# Patient Record
Sex: Female | Born: 1935 | Race: White | Hispanic: No | Smoking: Never smoker
Health system: Southern US, Community
[De-identification: ages and names within clinical notes are randomized; demographics above are authoritative.]

## PROBLEM LIST (undated history)

## (undated) DIAGNOSIS — I10 Essential (primary) hypertension: Secondary | ICD-10-CM

## (undated) DIAGNOSIS — T148XXA Other injury of unspecified body region, initial encounter: Secondary | ICD-10-CM

## (undated) DIAGNOSIS — I639 Cerebral infarction, unspecified: Secondary | ICD-10-CM

## (undated) DIAGNOSIS — E785 Hyperlipidemia, unspecified: Secondary | ICD-10-CM

## (undated) DIAGNOSIS — J90 Pleural effusion, not elsewhere classified: Secondary | ICD-10-CM

## (undated) DIAGNOSIS — I714 Abdominal aortic aneurysm, without rupture, unspecified: Secondary | ICD-10-CM

## (undated) DIAGNOSIS — I509 Heart failure, unspecified: Secondary | ICD-10-CM

## (undated) DIAGNOSIS — IMO0002 Reserved for concepts with insufficient information to code with codable children: Secondary | ICD-10-CM

## (undated) DIAGNOSIS — L309 Dermatitis, unspecified: Secondary | ICD-10-CM

## (undated) DIAGNOSIS — I499 Cardiac arrhythmia, unspecified: Secondary | ICD-10-CM

## (undated) DIAGNOSIS — E119 Type 2 diabetes mellitus without complications: Secondary | ICD-10-CM

## (undated) HISTORY — PX: ABDOMINAL HYSTERECTOMY: SHX81

## (undated) HISTORY — DX: Pleural effusion, not elsewhere classified: J90

## (undated) HISTORY — PX: CORNEAL TRANSPLANT: SHX108

## (undated) HISTORY — DX: Reserved for concepts with insufficient information to code with codable children: IMO0002

## (undated) HISTORY — DX: Cerebral infarction, unspecified: I63.9

## (undated) HISTORY — DX: Hyperlipidemia, unspecified: E78.5

## (undated) HISTORY — DX: Essential (primary) hypertension: I10

## (undated) HISTORY — DX: Heart failure, unspecified: I50.9

## (undated) HISTORY — DX: Dermatitis, unspecified: L30.9

## (undated) HISTORY — DX: Abdominal aortic aneurysm, without rupture, unspecified: I71.40

## (undated) HISTORY — DX: Type 2 diabetes mellitus without complications: E11.9

## (undated) HISTORY — PX: CHOLECYSTECTOMY: SHX55

## (undated) HISTORY — DX: Other injury of unspecified body region, initial encounter: T14.8XXA

## (undated) HISTORY — DX: Abdominal aortic aneurysm, without rupture: I71.4

## (undated) HISTORY — PX: KNEE SURGERY: SHX244

---

## 2014-01-30 ENCOUNTER — Ambulatory Visit (INDEPENDENT_AMBULATORY_CARE_PROVIDER_SITE_OTHER): Payer: Medicare HMO | Admitting: Cardiovascular Disease

## 2014-01-30 ENCOUNTER — Encounter: Payer: Self-pay | Admitting: Cardiovascular Disease

## 2014-01-30 VITALS — BP 168/74 | HR 70 | Ht 65.5 in | Wt 158.0 lb

## 2014-01-30 DIAGNOSIS — I4891 Unspecified atrial fibrillation: Secondary | ICD-10-CM | POA: Insufficient documentation

## 2014-01-30 DIAGNOSIS — I482 Chronic atrial fibrillation, unspecified: Secondary | ICD-10-CM

## 2014-01-30 DIAGNOSIS — R0602 Shortness of breath: Secondary | ICD-10-CM

## 2014-01-30 DIAGNOSIS — I509 Heart failure, unspecified: Secondary | ICD-10-CM | POA: Insufficient documentation

## 2014-01-30 DIAGNOSIS — R600 Localized edema: Secondary | ICD-10-CM

## 2014-01-30 DIAGNOSIS — R0789 Other chest pain: Secondary | ICD-10-CM

## 2014-01-30 DIAGNOSIS — R609 Edema, unspecified: Secondary | ICD-10-CM

## 2014-01-30 NOTE — Assessment & Plan Note (Signed)
Jean Murphy  presents for evaluation he she's had leg edema for the past several months and recently moved from [place]. She was told that she had congestive heart failure. I do not have any results of echoes or other assessments of left ventricular function. She does have significant leg edema all the way up to her thighs.  It's difficult to say whether this leg edema due to her general and activity.  She walks with a walker and so this may be due to venous insufficiency.  In addition, she also has atrial fibrillation which is likely continuing to her congestive heart failure. Her atrial fibrillation rate is well controlled.  We'll get an echo for further assessment of her LV function. We'll draw labs including basic metabolic profile, TSH, CBC, B. natruretic peptide, and liver enzymes.  I will see her  again in 3 months for followup visit.  I have told her son that  we will be very conservative with her management. I do not anticipate putting her through any invasive procedures.

## 2014-01-30 NOTE — Patient Instructions (Addendum)
Your physician has requested that you have an echocardiogram. Echocardiography is a painless test that uses sound waves to create images of your heart. It provides your doctor with information about the size and shape of your heart and how well your heart's chambers and valves are working. This procedure takes approximately one hour. There are no restrictions for this procedure.  We will draw labs today:  CBC, BMET, Liver profile, BNP, TSH  Your physician recommends that you schedule a follow-up appointment in: 3 months  I recommend that you elevate your legs .   Go to [email or web address]

## 2014-01-30 NOTE — Progress Notes (Signed)
Jean Murphy Date of Birth  July 15, 1917       [facility] Office [address], [place], [place]  [address]   [place]  [address] [place]   Fax  [phone]     Fax [phone]  Problem List: 1. Congestive heart failure 2. Atrial fibrillation 3. Abdominal aortic aneurysm 4. Hypertension  History of Present Illness:  Jean Murphy is a 80 y.o. femaile who was recently diagnosed with CHF - she presented to her medical doctor and was found to have leg edema and dyspnea.   She was diagnosed with CHF up in [facility] - records are not available yet.   She was started on Lasix ( and is also on Digoxin) .  She was not aware that she had atrial fib.  Is only on an aspirin - not on anticoagulant.   She lives in [place] [facility])    She moves around slowly.  She does not do any of her own shopping - goes with family.   Walks with a walker in the store.    Wears home O2 at night.  Current Outpatient Prescriptions on File Prior to Visit  Medication Sig Dispense Refill  . aspirin 81 MG tablet Take 81 mg by mouth daily.      . digoxin (LANOXIN) 0.125 MG tablet Take by mouth daily.      . furosemide (LASIX) 20 MG tablet Take 20 mg by mouth 3 (three) times daily.       . hydrALAZINE (APRESOLINE) 50 MG tablet Take 50 mg by mouth every 8 (eight) hours.      . metoprolol (LOPRESSOR) 50 MG tablet Take 50 mg by mouth 2 (two) times daily.      Marland Kitchen PARoxetine (PAXIL) 10 MG tablet Take 10 mg by mouth daily.       No current facility-administered medications on file prior to visit.    Allergies  Allergen Reactions  . Penicillins   . Shellfish Allergy     Hives      Past Medical History  Diagnosis Date  . CHF (congestive heart failure)   . Pleural effusion   . HTN (hypertension)   . Stroke   . Hyperlipidemia   . Diabetes mellitus without complication   . Compound fracture     left  leg  . AAA (abdominal aortic aneurysm)   . Eczema   . Compression fracture     x 2     Past Surgical History  Procedure Laterality Date  . Cholecystectomy    . Abdominal hysterectomy    . Knee surgery      right knee   . Corneal transplant      History  Smoking status  . Former Smoker -- 0.25 packs/day for 20 years  . Types: Cigarettes  Smokeless tobacco  . Not on file    History  Alcohol Use No    Family History  Problem Relation Age of Onset  . Heart attack Mother 90  . Heart disease Mother     Reviw of Systems:  Reviewed in the HPI.  All other systems are negative.  Physical Exam: Blood pressure 168/74, pulse 70, height 5' 5.5" (1.664 m), weight 158 lb (71.668 kg). Wt Readings from Last 3 Encounters:  01/30/14 158 lb (71.668 kg)     General: Well developed, well nourished, in no acute distress.  Head: Normocephalic, atraumatic, sclera non-icteric, mucus membranes are moist,   Neck: Supple. Carotids are 2 + without bruits. No JVD   Lungs: Clear   Heart: Irreg. Irreg.   Abdomen: Soft, non-tender, non-distended with normal bowel sounds.  Msk:  Strength and tone are normal   Extremities: No clubbing or cyanosis. 2+ edema up to her thighs.   Neuro: CN II - XII intact.  Alert and oriented X 3.   Psych:  Normal   ECG: Atrial fibrillation with controlled ventricular response  Assessment / Plan:

## 2014-01-30 NOTE — Assessment & Plan Note (Signed)
Her atrial fibrillation rate is well controlled. She is on aspirin but is not on anticoagulant. She has a history of falls and her son-in-law states that she falls every month percent. She walks with a walker but becomes unsteady on occasion.  We'll continue aspirin at current dose.  She is also on digoxin 0.125 mg a day. Her heart rate is well controlled. She's not having any blurred vision or nausea. We'll consider getting a digoxin level  but clinically she does not appear to be dig toxic so I do not think it is needed today.  She is completely asymptomatic and cannot tell that her heart is beating irregularly.

## 2014-02-17 ENCOUNTER — Ambulatory Visit: Payer: Self-pay | Admitting: Interventional Cardiology

## 2014-02-20 ENCOUNTER — Other Ambulatory Visit: Payer: Medicare HMO

## 2014-02-25 ENCOUNTER — Other Ambulatory Visit (HOSPITAL_COMMUNITY): Payer: Self-pay | Admitting: Cardiovascular Disease

## 2014-02-25 DIAGNOSIS — R0602 Shortness of breath: Secondary | ICD-10-CM

## 2014-02-26 ENCOUNTER — Ambulatory Visit (HOSPITAL_COMMUNITY): Payer: Medicare HMO | Attending: Cardiovascular Disease

## 2014-02-26 DIAGNOSIS — I509 Heart failure, unspecified: Secondary | ICD-10-CM | POA: Insufficient documentation

## 2014-02-26 DIAGNOSIS — I4891 Unspecified atrial fibrillation: Secondary | ICD-10-CM | POA: Diagnosis not present

## 2014-02-26 DIAGNOSIS — R0602 Shortness of breath: Secondary | ICD-10-CM | POA: Diagnosis not present

## 2014-02-26 NOTE — Progress Notes (Signed)
2D Echo completed. 02/26/2014

## 2014-02-27 ENCOUNTER — Telehealth: Payer: Self-pay | Admitting: *Deleted

## 2014-02-27 NOTE — Telephone Encounter (Signed)
Message copied by Verna Czech on Fri Feb 27, 2014  2:12 PM ------      Message from: [place], [place] J      Created: Fri Feb 27, 2014  1:55 PM       The LV function is normal      She has moderate pulmonary hypertension      Continue current meds       ------

## 2014-02-27 NOTE — Telephone Encounter (Signed)
Left detailed message on personal voicemail Horton Chin RN

## 2014-05-01 ENCOUNTER — Ambulatory Visit: Payer: Medicare HMO | Admitting: Cardiovascular Disease

## 2014-05-05 ENCOUNTER — Encounter: Payer: Self-pay | Admitting: Cardiovascular Disease

## 2014-05-05 ENCOUNTER — Ambulatory Visit (INDEPENDENT_AMBULATORY_CARE_PROVIDER_SITE_OTHER): Payer: Commercial Managed Care - HMO | Admitting: Cardiovascular Disease

## 2014-05-05 VITALS — BP 130/74 | HR 61 | Ht 65.5 in | Wt 139.0 lb

## 2014-05-05 DIAGNOSIS — I482 Chronic atrial fibrillation, unspecified: Secondary | ICD-10-CM

## 2014-05-05 DIAGNOSIS — I509 Heart failure, unspecified: Secondary | ICD-10-CM

## 2014-05-05 NOTE — Assessment & Plan Note (Signed)
Jean Murphy seems to be doing well. She has a hx of falling so she has not been on anticoagulation I've discussed this with her son who agrees with that plan  She has had a CVA in the past and is at risk for further strokes Hr is well controlled.

## 2014-05-05 NOTE — Patient Instructions (Signed)
Your physician recommends that you continue on your current medications as directed. Please refer to the Current Medication list given to you today.  Your physician wants you to follow-up in: 6 months with Dr. Nahser.  You will receive a reminder letter in the mail two months in advance. If you don't receive a letter, please call our office to schedule the follow-up appointment.  

## 2014-05-05 NOTE — Assessment & Plan Note (Signed)
She seems to be fairly stable. She has chronic leg edema. She doesn't seem to be in any distress. Overall she's doing quite well for 80 years old. I'll see her again in 6 months.

## 2014-05-05 NOTE — Progress Notes (Signed)
Jean Murphy Date of Birth  12-23-1917       [facility] [address], [place], [place]  [address]   [place]  [address] [place]   Fax  [phone]     Fax [phone]  Problem List: 1. Congestive heart failure 2. Atrial fibrillation 3. Abdominal aortic aneurysm 4. Hypertension  History of Present Illness:  [name] is a 80 y.o. femaile who was recently diagnosed with CHF - she presented to her medical doctor and was found to have leg edema and dyspnea.   She was diagnosed with CHF up in [facility] - records are not available yet.   She was started on Lasix ( and is also on Digoxin) .  She was not aware that she had atrial fib.  Is only on an aspirin - not on anticoagulant.   [place] in [place] [facility] [place]    She moves around slowly.  She does not do any of her own shopping - goes with family.   Walks with a walker in the store.    Wears home O2 at night.   Dec. 8, 2015:  Ms. Jean [place] is a 80 yo who we see for Atrial fib, CHF,  And AAA. She is on home O2.  Does not have a portable O2 unit so she is hypoxic presently. Does not appear to be in any distress.    Current Outpatient Prescriptions on File Prior to Visit  Medication Sig Dispense Refill  . acetaminophen (TYLENOL) 500 MG tablet Take 500 mg by mouth every 6 (six) hours as needed.    Marland Kitchen aspirin 81 MG tablet Take 81 mg by mouth daily.    . calcitonin, salmon, (MIACALCIN/FORTICAL) 200 UNIT/ACT nasal spray Place 1 spray into alternate nostrils daily.    . Difluprednate (DUREZOL) 0.05 % EMUL Apply to eye 2 (two) times daily.    . digoxin (LANOXIN) 0.125 MG tablet Take by mouth daily.    . furosemide (LASIX) 20 MG tablet Take 20 mg by mouth 3 (three) times daily.     . hydrALAZINE (APRESOLINE) 50 MG tablet Take 50 mg by mouth every 8 (eight) hours.    . metoprolol (LOPRESSOR) 50 MG tablet Take 50 mg by mouth  2 (two) times daily.    . Multiple Vitamin (MULTIVITAMIN) tablet Take 1 tablet by mouth daily.    Marland Kitchen PARoxetine (PAXIL) 10 MG tablet Take 10 mg by mouth daily.    . potassium chloride 20 MEQ/15ML (10%) solution Take 20 mEq by mouth daily.     No current facility-administered medications on file prior to visit.    Allergies  Allergen Reactions  . Penicillins   . Shellfish Allergy     Hives      Past Medical History  Diagnosis Date  . CHF (congestive heart failure)   . Pleural effusion   . HTN (hypertension)   . Stroke   . Hyperlipidemia   . Diabetes mellitus without complication   . Compound fracture     left leg  . AAA (abdominal aortic aneurysm)   . Eczema   . Compression fracture     x 2     Past Surgical History  Procedure Laterality Date  . Cholecystectomy    . Abdominal hysterectomy    . Knee surgery      right knee   . Corneal transplant  History  Smoking status  . Former Smoker -- 0.25 packs/day for 20 years  . Types: Cigarettes  Smokeless tobacco  . Not on file    History  Alcohol Use No    Family History  Problem Relation Age of Onset  . Heart attack Mother 100  . Heart disease Mother     Reviw of Systems:  Reviewed in the HPI.  All other systems are negative.  Physical Exam: Blood pressure 130/74, pulse 61, height 5' 5.5" (1.664 m), weight 139 lb (63.05 kg), SpO2 84 %. Wt Readings from Last 3 Encounters:  05/05/14 139 lb (63.05 kg)  01/30/14 158 lb (71.668 kg)     General: Well developed, well nourished, in no acute distress.  Head: Normocephalic, atraumatic, sclera non-icteric, mucus membranes are moist,   Neck: Supple. Carotids are 2 + without bruits. No JVD   Lungs: Clear   Heart: Irreg. Irreg.   Abdomen: Soft, non-tender, non-distended with normal bowel sounds.  Msk:  Strength and tone are normal   Extremities: No clubbing or cyanosis. 2+ edema up to her thighs.   Neuro: CN II - XII intact.  Alert and oriented X 3.     Psych:  Normal   ECG: DEc. 8, 2015:   Atrial fib at 61,  No ST or T wave abn.  Assessment / Plan:

## 2014-05-30 ENCOUNTER — Encounter (HOSPITAL_COMMUNITY): Payer: Self-pay | Admitting: Emergency Medicine

## 2014-05-30 ENCOUNTER — Emergency Department (HOSPITAL_COMMUNITY): Payer: Commercial Managed Care - HMO

## 2014-05-30 ENCOUNTER — Inpatient Hospital Stay (HOSPITAL_COMMUNITY)
Admission: EM | Admit: 2014-05-30 | Discharge: 2014-06-09 | DRG: 291 | Disposition: A | Discharge status: Skilled Nursing Facility | Payer: Commercial Managed Care - HMO | Attending: Family Medicine | Admitting: Family Medicine

## 2014-05-30 DIAGNOSIS — J00 Acute nasopharyngitis [common cold]: Secondary | ICD-10-CM

## 2014-05-30 DIAGNOSIS — I482 Chronic atrial fibrillation: Secondary | ICD-10-CM | POA: Diagnosis present

## 2014-05-30 DIAGNOSIS — R600 Localized edema: Secondary | ICD-10-CM | POA: Diagnosis not present

## 2014-05-30 DIAGNOSIS — I272 Other secondary pulmonary hypertension: Secondary | ICD-10-CM | POA: Diagnosis present

## 2014-05-30 DIAGNOSIS — Z8249 Family history of ischemic heart disease and other diseases of the circulatory system: Secondary | ICD-10-CM

## 2014-05-30 DIAGNOSIS — I248 Other forms of acute ischemic heart disease: Secondary | ICD-10-CM | POA: Diagnosis present

## 2014-05-30 DIAGNOSIS — A047 Enterocolitis due to Clostridium difficile: Secondary | ICD-10-CM | POA: Diagnosis not present

## 2014-05-30 DIAGNOSIS — Z7982 Long term (current) use of aspirin: Secondary | ICD-10-CM

## 2014-05-30 DIAGNOSIS — Z880 Allergy status to penicillin: Secondary | ICD-10-CM

## 2014-05-30 DIAGNOSIS — Z9049 Acquired absence of other specified parts of digestive tract: Secondary | ICD-10-CM | POA: Diagnosis present

## 2014-05-30 DIAGNOSIS — E876 Hypokalemia: Secondary | ICD-10-CM | POA: Diagnosis not present

## 2014-05-30 DIAGNOSIS — Z79899 Other long term (current) drug therapy: Secondary | ICD-10-CM

## 2014-05-30 DIAGNOSIS — A419 Sepsis, unspecified organism: Secondary | ICD-10-CM | POA: Diagnosis not present

## 2014-05-30 DIAGNOSIS — J189 Pneumonia, unspecified organism: Secondary | ICD-10-CM

## 2014-05-30 DIAGNOSIS — E118 Type 2 diabetes mellitus with unspecified complications: Secondary | ICD-10-CM | POA: Diagnosis present

## 2014-05-30 DIAGNOSIS — R7989 Other specified abnormal findings of blood chemistry: Secondary | ICD-10-CM

## 2014-05-30 DIAGNOSIS — E785 Hyperlipidemia, unspecified: Secondary | ICD-10-CM | POA: Diagnosis present

## 2014-05-30 DIAGNOSIS — Z8673 Personal history of transient ischemic attack (TIA), and cerebral infarction without residual deficits: Secondary | ICD-10-CM

## 2014-05-30 DIAGNOSIS — Z9181 History of falling: Secondary | ICD-10-CM

## 2014-05-30 DIAGNOSIS — I5033 Acute on chronic diastolic (congestive) heart failure: Secondary | ICD-10-CM | POA: Diagnosis not present

## 2014-05-30 DIAGNOSIS — G9341 Metabolic encephalopathy: Secondary | ICD-10-CM | POA: Diagnosis not present

## 2014-05-30 DIAGNOSIS — R778 Other specified abnormalities of plasma proteins: Secondary | ICD-10-CM | POA: Diagnosis present

## 2014-05-30 DIAGNOSIS — Z87891 Personal history of nicotine dependence: Secondary | ICD-10-CM

## 2014-05-30 DIAGNOSIS — I10 Essential (primary) hypertension: Secondary | ICD-10-CM

## 2014-05-30 DIAGNOSIS — Z9981 Dependence on supplemental oxygen: Secondary | ICD-10-CM

## 2014-05-30 DIAGNOSIS — Z90710 Acquired absence of both cervix and uterus: Secondary | ICD-10-CM

## 2014-05-30 DIAGNOSIS — W19XXXA Unspecified fall, initial encounter: Secondary | ICD-10-CM

## 2014-05-30 DIAGNOSIS — N390 Urinary tract infection, site not specified: Secondary | ICD-10-CM | POA: Diagnosis present

## 2014-05-30 DIAGNOSIS — I714 Abdominal aortic aneurysm, without rupture: Secondary | ICD-10-CM | POA: Diagnosis present

## 2014-05-30 DIAGNOSIS — M109 Gout, unspecified: Secondary | ICD-10-CM | POA: Diagnosis present

## 2014-05-30 DIAGNOSIS — J690 Pneumonitis due to inhalation of food and vomit: Secondary | ICD-10-CM | POA: Diagnosis not present

## 2014-05-30 DIAGNOSIS — Z91013 Allergy to seafood: Secondary | ICD-10-CM

## 2014-05-30 DIAGNOSIS — J40 Bronchitis, not specified as acute or chronic: Secondary | ICD-10-CM

## 2014-05-30 LAB — CBG MONITORING, ED: GLUCOSE-CAPILLARY: 129 mg/dL — AB (ref 70–99)

## 2014-05-30 LAB — CBC WITH DIFFERENTIAL/PLATELET
BASOS ABS: 0 10*3/uL (ref 0.0–0.1)
BASOS PCT: 0 % (ref 0–1)
Eosinophils Absolute: 0 10*3/uL (ref 0.0–0.7)
Eosinophils Relative: 0 % (ref 0–5)
HEMATOCRIT: 47.3 % — AB (ref 36.0–46.0)
HEMOGLOBIN: 14.9 g/dL (ref 12.0–15.0)
LYMPHS ABS: 0.5 10*3/uL — AB (ref 0.7–4.0)
LYMPHS PCT: 4 % — AB (ref 12–46)
MCH: 30.9 pg (ref 26.0–34.0)
MCHC: 31.5 g/dL (ref 30.0–36.0)
MCV: 98.1 fL (ref 78.0–100.0)
Monocytes Absolute: 1.1 10*3/uL — ABNORMAL HIGH (ref 0.1–1.0)
Monocytes Relative: 9 % (ref 3–12)
NEUTROS ABS: 10.8 10*3/uL — AB (ref 1.7–7.7)
NEUTROS PCT: 87 % — AB (ref 43–77)
PLATELETS: 259 10*3/uL (ref 150–400)
RBC: 4.82 MIL/uL (ref 3.87–5.11)
RDW: 15.2 % (ref 11.5–15.5)
WBC: 12.4 10*3/uL — AB (ref 4.0–10.5)

## 2014-05-30 LAB — URINALYSIS, ROUTINE W REFLEX MICROSCOPIC
GLUCOSE, UA: NEGATIVE mg/dL
Ketones, ur: NEGATIVE mg/dL
Nitrite: NEGATIVE
Protein, ur: >300 mg/dL — AB
Specific Gravity, Urine: 1.018 (ref 1.005–1.030)
Urobilinogen, UA: 1 mg/dL (ref 0.0–1.0)
pH: 6 (ref 5.0–8.0)

## 2014-05-30 LAB — URINE MICROSCOPIC-ADD ON

## 2014-05-30 LAB — I-STAT CHEM 8, ED
BUN: 26 mg/dL — ABNORMAL HIGH (ref 6–23)
CALCIUM ION: 0.94 mmol/L — AB (ref 1.13–1.30)
Chloride: 95 mEq/L — ABNORMAL LOW (ref 96–112)
Creatinine, Ser: 0.9 mg/dL (ref 0.50–1.10)
GLUCOSE: 138 mg/dL — AB (ref 70–99)
HCT: 51 % — ABNORMAL HIGH (ref 36.0–46.0)
HEMOGLOBIN: 17.3 g/dL — AB (ref 12.0–15.0)
Potassium: 3.7 mmol/L (ref 3.5–5.1)
SODIUM: 142 mmol/L (ref 135–145)
TCO2: 35 mmol/L (ref 0–100)

## 2014-05-30 LAB — DIGOXIN LEVEL: Digoxin Level: 0.4 ng/mL — ABNORMAL LOW (ref 0.8–2.0)

## 2014-05-30 LAB — I-STAT TROPONIN, ED: Troponin i, poc: 0.16 ng/mL (ref 0.00–0.08)

## 2014-05-30 LAB — BRAIN NATRIURETIC PEPTIDE: B Natriuretic Peptide: 691.9 pg/mL — ABNORMAL HIGH (ref 0.0–100.0)

## 2014-05-30 LAB — CK: Total CK: 147 U/L (ref 7–177)

## 2014-05-30 LAB — TROPONIN I: Troponin I: 0.21 ng/mL — ABNORMAL HIGH (ref <0.031)

## 2014-05-30 MED ORDER — ACETAMINOPHEN 325 MG PO TABS
650.0000 mg | ORAL_TABLET | Freq: Once | ORAL | Status: AC
  Administered 2014-05-30: 650 mg via ORAL
  Filled 2014-05-30: qty 2

## 2014-05-30 MED ORDER — ASPIRIN 81 MG PO CHEW
324.0000 mg | CHEWABLE_TABLET | Freq: Once | ORAL | Status: AC
  Administered 2014-05-30: 324 mg via ORAL
  Filled 2014-05-30: qty 4

## 2014-05-30 MED ORDER — SODIUM CHLORIDE 0.9 % IV BOLUS (SEPSIS)
500.0000 mL | Freq: Once | INTRAVENOUS | Status: AC
  Administered 2014-05-30: 500 mL via INTRAVENOUS

## 2014-05-30 MED ORDER — DEXTROSE 5 % IV SOLN
1.0000 g | Freq: Once | INTRAVENOUS | Status: AC
  Administered 2014-05-30: 1 g via INTRAVENOUS
  Filled 2014-05-30: qty 10

## 2014-05-30 NOTE — ED Notes (Signed)
PA at bedside.

## 2014-05-30 NOTE — ED Notes (Signed)
ED PA Domenic Moras at bedside

## 2014-05-30 NOTE — ED Notes (Signed)
Bed: RP39 Expected date: 05/30/14 Expected time: 7:30 PM Means of arrival: Ambulance Comments: Bed 12, EMS, 4 F, Fall

## 2014-05-30 NOTE — ED Notes (Signed)
Per EMS, pt. from [facility] , reported of fall without LOC. Pt. Was found by daughter on the floor beside bed at 5pm this afternoon, pt. Stated she fell and can not get up, not sure how long she was  on the floor, alert oriented x2, claimed of tail bone pain at 5/10. Pt. Has old bruising on left side of face.

## 2014-05-30 NOTE — ED Provider Notes (Signed)
CSN: [id number]     Arrival date & time 05/30/14  1933 History   First MD Initiated Contact with Patient 05/30/14 1945     No chief complaint on file.    (Consider location/radiation/quality/duration/timing/severity/associated sxs/prior Treatment) HPI   81 year old female with history of chronic A. fib but currently not on any blood thinner medication presenting for evaluation of a fall. Patient was brought here via EMS. Per EMS, patient was [facility] from [place] [facility] when she had a fall. Patient was found by daughter on the floor beside her bed at approximately 3 hours ago. Patient states she fell and cannot get up. Patient is a poor historian. Per daughter, patient lives at an independent living facility. Daughter found patient laying next to her bed on the floor. Patient was in no acute distress but was unable to get up. Patient does complaining of pain to her buttock. She has had prior compression fracture of the spine with improvement of daily living after rehabilitation. She normally walks with a walker. She is mentating at her baseline. She did not have her blood pressure medication today. At this time patient only complaining of pain in her buttock. When asked if she experiencing any sensation of dizziness prior to the fall patient said yes. When asked if she has any chest pain patient denies. She denies having any shortness of breath, headache, numbness or weakness. She is currently on digoxin. She cannot tell me how long she was on the floor. Daughter mentioned patient has numerous falls within the past few months.  Past Medical History  Diagnosis Date  . CHF (congestive heart failure)   . Pleural effusion   . HTN (hypertension)   . Stroke   . Hyperlipidemia   . Diabetes mellitus without complication   . Compound fracture     left leg  . AAA (abdominal aortic aneurysm)   . Eczema   . Compression fracture     x 2    Past Surgical History  Procedure Laterality Date   . Cholecystectomy    . Abdominal hysterectomy    . Knee surgery      right knee   . Corneal transplant     Family History  Problem Relation Age of Onset  . Heart attack Mother 71  . Heart disease Mother    History  Substance Use Topics  . Smoking status: Former Smoker -- 0.25 packs/day for 20 years    Types: Cigarettes  . Smokeless tobacco: Not on file  . Alcohol Use: No   OB History    No data available     Review of Systems  Unable to perform ROS: Age      Allergies  Penicillins and Shellfish allergy  Home Medications   Prior to Admission medications   Medication Sig Start Date End Date Taking? Authorizing Provider  acetaminophen (TYLENOL) 500 MG tablet Take 500 mg by mouth every 6 (six) hours as needed.    Historical Provider, MD  aspirin 81 MG tablet Take 81 mg by mouth daily.    Historical Provider, MD  calcitonin, salmon, (MIACALCIN/FORTICAL) 200 UNIT/ACT nasal spray Place 1 spray into alternate nostrils daily.    Historical Provider, MD  Difluprednate (DUREZOL) 0.05 % EMUL Apply to eye 2 (two) times daily.    Historical Provider, MD  digoxin (LANOXIN) 0.125 MG tablet Take by mouth daily.    Historical Provider, MD  docusate sodium (COLACE) 100 MG capsule  04/03/14   Historical Provider, MD  fluticasone (FLONASE) 50 MCG/ACT nasal spray  04/07/14   Historical Provider, MD  furosemide (LASIX) 20 MG tablet Take 20 mg by mouth 3 (three) times daily.     Historical Provider, MD  hydrALAZINE (APRESOLINE) 50 MG tablet Take 50 mg by mouth every 8 (eight) hours.    Historical Provider, MD  metoprolol (LOPRESSOR) 50 MG tablet Take 50 mg by mouth 2 (two) times daily.    Historical Provider, MD  Multiple Vitamin (MULTIVITAMIN) tablet Take 1 tablet by mouth daily.    Historical Provider, MD  PARoxetine (PAXIL) 10 MG tablet Take 10 mg by mouth daily.    Historical Provider, MD  potassium chloride 20 MEQ/15ML (10%) solution Take 20 mEq by mouth daily.    Historical Provider, MD    SpO2 96% Physical Exam  Constitutional: She appears well-developed and well-nourished. No distress.  HENT:  Head: Atraumatic.  Mild bruising noted to left upper eyelid.  subconjunctival hemorrhages noted to left eye (daughter sts this is not new)  Eyes: Conjunctivae and EOM are normal. Pupils are equal, round, and reactive to light.  Neck: Neck supple.  No cervical spine tenderness  Cardiovascular:  Irregularly irregular  Pulmonary/Chest: Effort normal and breath sounds normal.  Abdominal: There is no tenderness.  Musculoskeletal: She exhibits tenderness (Tenderness to midline lumbar and lumbosacral region on palpation without crepitus or step-off.).  Patient able to move all 4 extremities with normal strength. Intact distal pulses.  Neurological: She is alert.  Patient is alert to self only but appears to be in no acute distress. No gross focal neuro deficit on exam.  Skin: No rash noted.  Pressure ulcer noted to lumbosacral region.  Psychiatric: She has a normal mood and affect.  Nursing note and vitals reviewed.   ED Course  Procedures (including critical care time)  Patient sent here for evaluation of a recent fall. This is an unwitnessed fall. History is difficult to obtain due to her mentation. Workup initiated.  Care discussed with Dr. Johnney Killian.  9:15 PM ECG does not show STEMI.  Initial istat trop is 0.16, however pt appears dehydrated with a hemoconcentrated blood.  Will recheck Trop.  Care discussed with attending.    9:31 PM UA with strong urine odor, trace leukocyte esterase, 7-10 WBC and many bacteria.  Therefore, plan to sent urine culture but will initiate treatment including rocephin 1g IV.  Pt is hypertensive, will recheck BP, she did not have her home medication today.    10:50 PM Troponin I is elevated at 0.21. Repeat EKG without obvious ischemic changes. This was shared with my attending. She denies having any chest pain at this time. Aspirin given. The  finding could still be related to her dehydration. My attending does not recommend cardiology consult at this time. Will consult hospitalist for admission for treatment of her dehydration, UTI, and a cardiology rule out. Her digoxin level is subtherapeutic. Patient currently resting comfortably.  11:03 PM i have consulted with [facility], Dr. Hal Hope who agrees for admission to tele, under his care.    Labs Review Labs Reviewed  CBC WITH DIFFERENTIAL - Abnormal; Notable for the following:    WBC 12.4 (*)    HCT 47.3 (*)    Neutrophils Relative % 87 (*)    Neutro Abs 10.8 (*)    Lymphocytes Relative 4 (*)    Lymphs Abs 0.5 (*)    Monocytes Absolute 1.1 (*)    All other components within normal limits  URINALYSIS, ROUTINE W  REFLEX MICROSCOPIC - Abnormal; Notable for the following:    Color, Urine AMBER (*)    APPearance CLOUDY (*)    Hgb urine dipstick SMALL (*)    Bilirubin Urine SMALL (*)    Protein, ur >300 (*)    Leukocytes, UA TRACE (*)    All other components within normal limits  DIGOXIN LEVEL - Abnormal; Notable for the following:    Digoxin Level 0.4 (*)    All other components within normal limits  TROPONIN I - Abnormal; Notable for the following:    Troponin I 0.21 (*)    All other components within normal limits  URINE MICROSCOPIC-ADD ON - Abnormal; Notable for the following:    Bacteria, UA MANY (*)    All other components within normal limits  I-STAT CHEM 8, ED - Abnormal; Notable for the following:    Chloride 95 (*)    BUN 26 (*)    Glucose, Bld 138 (*)    Calcium, Ion 0.94 (*)    Hemoglobin 17.3 (*)    HCT 51.0 (*)    All other components within normal limits  I-STAT TROPOININ, ED - Abnormal; Notable for the following:    Troponin i, poc 0.16 (*)    All other components within normal limits  CBG MONITORING, ED - Abnormal; Notable for the following:    Glucose-Capillary 129 (*)    All other components within normal limits  CK  BRAIN NATRIURETIC  PEPTIDE    Imaging Review Dg Chest 2 View  05/30/2014   CLINICAL DATA:  Patient status post fall from assisted living at 5 this afternoon  EXAM: CHEST  2 VIEW  COMPARISON:  January 19, 2014  FINDINGS: The mediastinal contour is normal. The heart size is enlarged. The aorta is tortuous. There is central pulmonary edema. There are small bilateral pleural effusions. There is no focal pneumonia. Degenerative joint changes of bilateral shoulders are noted.  IMPRESSION: Mild congestive heart failure.  Small bilateral pleural effusions.   Electronically Signed   By: Abelardo Diesel M.D.   On: 05/30/2014 21:34   Dg Lumbar Spine Complete  05/30/2014   CLINICAL DATA:  Status post fall and nursing home 5 o'clock today.  EXAM: LUMBAR SPINE - COMPLETE 4+ VIEW  COMPARISON:  None.  FINDINGS: There are degenerative joint changes noted throughout lumbar spine. There is scoliosis of spine. There are compression deformities of L2, L1, T12, T11, T10, more prominently involving L2. These compression deformities are probably chronic but evaluation is limited without old films for comparison.  IMPRESSION: Scoliosis and degenerative joint changes throughout the spine. There are compression deformities of T10 through L2, probably chronic but assessment of chronicity is limited without old films for comparison. If the patient has focal pain at L2, acute component is not excluded.   Electronically Signed   By: Abelardo Diesel M.D.   On: 05/30/2014 21:37   Ct Head Wo Contrast  05/30/2014   CLINICAL DATA:  Initial encounter for Fall today and found on floor beside bed about 5 p.m. today.  EXAM: CT HEAD WITHOUT CONTRAST  TECHNIQUE: Contiguous axial images were obtained from the base of the skull through the vertex without intravenous contrast.  COMPARISON:  None.  FINDINGS: There is no evidence for acute hemorrhage, hydrocephalus, mass lesion, or abnormal extra-axial fluid collection. No definite CT evidence for acute infarction. Diffuse loss  of parenchymal volume is consistent with atrophy. Patchy low attenuation in the deep hemispheric and periventricular white matter is  nonspecific, but likely reflects chronic microvascular ischemic demyelination. Old lacunar infarct identified in the left basal ganglia.  The visualized paranasal sinuses and mastoid air cells are clear.  IMPRESSION: No acute intracranial abnormality.  Atrophy with chronic small vessel white matter ischemic disease.   Electronically Signed   By: Misty Stanley M.D.   On: 05/30/2014 21:15     EKG Interpretation   Date/Time:  Saturday May 30 2014 20:01:11 EST Ventricular Rate:  88 PR Interval:  76 QRS Duration: 85 QT Interval:  441 QTC Calculation: 534 R Axis:   -35 Text Interpretation:  Unknown rhythm, irregular rate Short PR interval  Inferior infarct, acute (LCx) Probable anterior infarct, age indeterminate  Prolonged QT interval poor technical quality. no STEMI. Will need repeat.  Confirmed by Johnney Killian, [place], Jeannie Done [phone]) on 05/30/2014 8:25:08 PM      MDM   Final diagnoses:  Fall  UTI (lower urinary tract infection)  Elevated troponin    BP 162/99 mmHg  Pulse 78  Temp(Src) 97.6 F (36.4 C) (Oral)  Resp 20  SpO2 97%  I have reviewed nursing notes and vital signs. I personally reviewed the imaging tests through PACS system  I reviewed available ER/hospitalization records thought the EMR     Domenic Moras, PA-C 05/30/14 [place], MD 05/31/14 [phone]

## 2014-05-30 NOTE — H&P (Signed)
[facility] History and Physical  Niya Behler BPZ:[id number] DOB: Aug 20, 1917 DOA: 05/30/2014  Referring physician: ER physician. PCP: Antony Blackbird, MD  Chief Complaint: Fall.  HPI: Jean Murphy is a 81 y.o. female with history of chronic atrial fibrillation, hypertension, CHF last EF measured was 65-70%, abdominal aortic aneurysm and compression fracture was brought to the ER after patient had a fall at her assisted living facility. Patient states that she slipped and fell and did not hurt her head. She didn't hit her head from a previous fall which left with left periorbital bruising. In the ER patient was found to have mildly elevated troponin but EKG was not showing no new changes and patient denies any chest pain. Patient's labs revealed leukocytosis and UA shows possible UTI. Patient has been admitted for further management.   Review of Systems: As presented in the history of presenting illness, rest negative.  Past Medical History  Diagnosis Date  . CHF (congestive heart failure)   . Pleural effusion   . HTN (hypertension)   . Stroke   . Hyperlipidemia   . Diabetes mellitus without complication   . Compound fracture     left leg  . AAA (abdominal aortic aneurysm)   . Eczema   . Compression fracture     x 2    Past Surgical History  Procedure Laterality Date  . Cholecystectomy    . Abdominal hysterectomy    . Knee surgery      right knee   . Corneal transplant     Social History:  reports that she has quit smoking. Her smoking use included Cigarettes. She has a 5 pack-year smoking history. She does not have any smokeless tobacco history on file. She reports that she does not drink alcohol or use illicit drugs. Where does patient live in assisted living facility. Can patient participate in ADLs? Yes.  Allergies  Allergen Reactions  . Penicillins     unknown  . Shellfish Allergy     Hives      Family History:  Family History  Problem Relation Age of  Onset  . Heart attack Mother 38  . Heart disease Mother       Prior to Admission medications   Medication Sig Start Date End Date Taking? Authorizing Provider  acetaminophen (TYLENOL) 500 MG tablet Take 500 mg by mouth every 6 (six) hours as needed for mild pain or headache.    Yes Historical Provider, MD  aspirin 81 MG tablet Take 81 mg by mouth daily.   Yes Historical Provider, MD  calcitonin, salmon, (MIACALCIN/FORTICAL) 200 UNIT/ACT nasal spray Place 1 spray into alternate nostrils daily.   Yes Historical Provider, MD  Difluprednate (DUREZOL) 0.05 % EMUL Place 1 drop into both eyes 2 (two) times daily.    Yes Historical Provider, MD  digoxin (LANOXIN) 0.125 MG tablet Take by mouth daily.   Yes Historical Provider, MD  docusate sodium (COLACE) 100 MG capsule Take 300 mg by mouth at bedtime.  04/03/14  Yes Historical Provider, MD  fluticasone (FLONASE) 50 MCG/ACT nasal spray Place 1 spray into both nostrils daily.  04/07/14  Yes Historical Provider, MD  furosemide (LASIX) 20 MG tablet Take 20 mg by mouth 3 (three) times daily.    Yes Historical Provider, MD  hydrALAZINE (APRESOLINE) 50 MG tablet Take 50 mg by mouth every 8 (eight) hours.   Yes Historical Provider, MD  metoprolol (LOPRESSOR) 50 MG tablet Take 50 mg by mouth 2 (two) times daily.  Yes Historical Provider, MD  Multiple Vitamin (MULTIVITAMIN) tablet Take 1 tablet by mouth daily.   Yes Historical Provider, MD  Multiple Vitamins-Minerals (PRESERVISION AREDS 2 PO) Take 1 capsule by mouth daily.   Yes Historical Provider, MD  PARoxetine (PAXIL) 20 MG tablet Take 20 mg by mouth daily.   Yes Historical Provider, MD  potassium chloride 20 MEQ/15ML (10%) solution Take 20 mEq by mouth daily.   Yes Historical Provider, MD    Physical Exam: Filed Vitals:   05/30/14 2239 05/30/14 2245 05/30/14 2300 05/30/14 2330  BP: 162/99  151/112   Pulse: 78 68 54 74  Temp:      TempSrc:      Resp: 20 29 23  32  SpO2: 97% 100% 99% 91%      General:  Moderately built and nourished.  Eyes: Mild ecchymosis in the left periorbital area.  ENT: No discharge from ears eyes nose and mouth.  Neck: No mass felt. No neck rigidity.  Cardiovascular: S1 and S2 heard.  Respiratory: No rhonchi or crepitations.  Abdomen: Soft nontender bowel sounds present.  Skin: Ecchymosis the left orbital area.  Musculoskeletal: Mild edema.  Psychiatric: Appears normal.  Neurologic: Alert awake oriented to time place and person. Moves all extremities.  Labs on Admission:  Basic Metabolic Panel:  Recent Labs Lab 05/30/14 2057  NA 142  K 3.7  CL 95*  GLUCOSE 138*  BUN 26*  CREATININE 0.90   Liver Function Tests: No results for input(s): AST, ALT, ALKPHOS, BILITOT, PROT, ALBUMIN in the last 168 hours. No results for input(s): LIPASE, AMYLASE in the last 168 hours. No results for input(s): AMMONIA in the last 168 hours. CBC:  Recent Labs Lab 05/30/14 2040 05/30/14 2057  WBC 12.4*  --   NEUTROABS 10.8*  --   HGB 14.9 17.3*  HCT 47.3* 51.0*  MCV 98.1  --   PLT 259  --    Cardiac Enzymes:  Recent Labs Lab 05/30/14 2040 05/30/14 2106  CKTOTAL 147  --   TROPONINI  --  0.21*    BNP (last 3 results) No results for input(s): PROBNP in the last 8760 hours. CBG:  Recent Labs Lab 05/30/14 2213  GLUCAP 129*    Radiological Exams on Admission: Dg Chest 2 View  05/30/2014   CLINICAL DATA:  Patient status post fall from assisted living at 5 this afternoon  EXAM: CHEST  2 VIEW  COMPARISON:  January 19, 2014  FINDINGS: The mediastinal contour is normal. The heart size is enlarged. The aorta is tortuous. There is central pulmonary edema. There are small bilateral pleural effusions. There is no focal pneumonia. Degenerative joint changes of bilateral shoulders are noted.  IMPRESSION: Mild congestive heart failure.  Small bilateral pleural effusions.   Electronically Signed   By: Abelardo Diesel M.D.   On: 05/30/2014 21:34    Dg Lumbar Spine Complete  05/30/2014   CLINICAL DATA:  Status post fall and nursing home 5 o'clock today.  EXAM: LUMBAR SPINE - COMPLETE 4+ VIEW  COMPARISON:  None.  FINDINGS: There are degenerative joint changes noted throughout lumbar spine. There is scoliosis of spine. There are compression deformities of L2, L1, T12, T11, T10, more prominently involving L2. These compression deformities are probably chronic but evaluation is limited without old films for comparison.  IMPRESSION: Scoliosis and degenerative joint changes throughout the spine. There are compression deformities of T10 through L2, probably chronic but assessment of chronicity is limited without old films for comparison. If  the patient has focal pain at L2, acute component is not excluded.   Electronically Signed   By: Abelardo Diesel M.D.   On: 05/30/2014 21:37   Ct Head Wo Contrast  05/30/2014   CLINICAL DATA:  Initial encounter for Fall today and found on floor beside bed about 5 p.m. today.  EXAM: CT HEAD WITHOUT CONTRAST  TECHNIQUE: Contiguous axial images were obtained from the base of the skull through the vertex without intravenous contrast.  COMPARISON:  None.  FINDINGS: There is no evidence for acute hemorrhage, hydrocephalus, mass lesion, or abnormal extra-axial fluid collection. No definite CT evidence for acute infarction. Diffuse loss of parenchymal volume is consistent with atrophy. Patchy low attenuation in the deep hemispheric and periventricular white matter is nonspecific, but likely reflects chronic microvascular ischemic demyelination. Old lacunar infarct identified in the left basal ganglia.  The visualized paranasal sinuses and mastoid air cells are clear.  IMPRESSION: No acute intracranial abnormality.  Atrophy with chronic small vessel white matter ischemic disease.   Electronically Signed   By: Misty Stanley M.D.   On: 05/30/2014 21:15    EKG: Independently reviewed. Atrial fibrillation with diffuse T-wave changes.  Comparable to old EKG.  Assessment/Plan Active Problems:   Fall   Elevated troponin   Chronic diastolic heart failure   UTI (lower urinary tract infection)   Uncontrolled hypertension   1. Fall - mechanical. We will get physical therapy consult and closely observe. Since patient has A. fib rule out any arrhythmias. 2. Elevated troponin - denies any chest pain. We will cycle cardiac markers. Aspirin. Check 2-D echo. 3. Chronic atrial fibrillation rate controlled - continue present medications. Patient was felt not a candidate for Coumadin secondary to falls. 4. Uncontrolled hypertension - continue home medications and have placed patient on when necessary IV hydralazine for systolic blood pressure more than 160. 5. Leukocytosis with UTI - patient is on ceftriaxone. Follow cultures. 6. History of CHF - closely follow intake output and metabolic panel. Last EF measured was 65-70% in October 2015. Appears compensated. Continue Lasix. 7. History of abdominal aortic aneurysm - denies any abdominal pain.   DVT Prophylaxis Lovenox.  Code Status: Full code.  Family Communication: None.  Disposition Plan: Admit for observation.    Hatsumi Steinhart N. [facility] Pager [phone].  If 7PM-7AM, please contact night-coverage [email or web address] Password [email or web address] 05/30/2014, 11:50 PM

## 2014-05-30 NOTE — ED Notes (Signed)
EKG given to EDP Pfieffer for review

## 2014-05-30 NOTE — ED Notes (Signed)
Pt.'s daughter stated that she gave to patient all her nightly medicines including her blood pressure medicine as permitted by the PA.

## 2014-05-30 NOTE — ED Notes (Signed)
MD at bedside. 

## 2014-05-31 ENCOUNTER — Encounter (HOSPITAL_COMMUNITY): Payer: Self-pay

## 2014-05-31 DIAGNOSIS — M109 Gout, unspecified: Secondary | ICD-10-CM | POA: Diagnosis present

## 2014-05-31 DIAGNOSIS — G9341 Metabolic encephalopathy: Secondary | ICD-10-CM | POA: Diagnosis not present

## 2014-05-31 DIAGNOSIS — W19XXXA Unspecified fall, initial encounter: Secondary | ICD-10-CM | POA: Diagnosis present

## 2014-05-31 DIAGNOSIS — Z90710 Acquired absence of both cervix and uterus: Secondary | ICD-10-CM | POA: Diagnosis not present

## 2014-05-31 DIAGNOSIS — N390 Urinary tract infection, site not specified: Secondary | ICD-10-CM | POA: Diagnosis present

## 2014-05-31 DIAGNOSIS — I10 Essential (primary) hypertension: Secondary | ICD-10-CM | POA: Diagnosis present

## 2014-05-31 DIAGNOSIS — I5032 Chronic diastolic (congestive) heart failure: Secondary | ICD-10-CM

## 2014-05-31 DIAGNOSIS — E876 Hypokalemia: Secondary | ICD-10-CM | POA: Diagnosis not present

## 2014-05-31 DIAGNOSIS — A419 Sepsis, unspecified organism: Secondary | ICD-10-CM | POA: Diagnosis not present

## 2014-05-31 DIAGNOSIS — I272 Other secondary pulmonary hypertension: Secondary | ICD-10-CM | POA: Diagnosis present

## 2014-05-31 DIAGNOSIS — Z87891 Personal history of nicotine dependence: Secondary | ICD-10-CM | POA: Diagnosis not present

## 2014-05-31 DIAGNOSIS — Z8673 Personal history of transient ischemic attack (TIA), and cerebral infarction without residual deficits: Secondary | ICD-10-CM | POA: Diagnosis not present

## 2014-05-31 DIAGNOSIS — I5033 Acute on chronic diastolic (congestive) heart failure: Principal | ICD-10-CM

## 2014-05-31 DIAGNOSIS — Z9181 History of falling: Secondary | ICD-10-CM | POA: Diagnosis not present

## 2014-05-31 DIAGNOSIS — Z880 Allergy status to penicillin: Secondary | ICD-10-CM | POA: Diagnosis not present

## 2014-05-31 DIAGNOSIS — I248 Other forms of acute ischemic heart disease: Secondary | ICD-10-CM | POA: Diagnosis present

## 2014-05-31 DIAGNOSIS — Z7982 Long term (current) use of aspirin: Secondary | ICD-10-CM | POA: Diagnosis not present

## 2014-05-31 DIAGNOSIS — R600 Localized edema: Secondary | ICD-10-CM | POA: Diagnosis not present

## 2014-05-31 DIAGNOSIS — J690 Pneumonitis due to inhalation of food and vomit: Secondary | ICD-10-CM | POA: Diagnosis not present

## 2014-05-31 DIAGNOSIS — Z79899 Other long term (current) drug therapy: Secondary | ICD-10-CM | POA: Diagnosis not present

## 2014-05-31 DIAGNOSIS — E785 Hyperlipidemia, unspecified: Secondary | ICD-10-CM | POA: Diagnosis present

## 2014-05-31 DIAGNOSIS — E118 Type 2 diabetes mellitus with unspecified complications: Secondary | ICD-10-CM | POA: Diagnosis present

## 2014-05-31 DIAGNOSIS — Z9049 Acquired absence of other specified parts of digestive tract: Secondary | ICD-10-CM | POA: Diagnosis present

## 2014-05-31 DIAGNOSIS — Z8249 Family history of ischemic heart disease and other diseases of the circulatory system: Secondary | ICD-10-CM | POA: Diagnosis not present

## 2014-05-31 DIAGNOSIS — I482 Chronic atrial fibrillation: Secondary | ICD-10-CM | POA: Diagnosis present

## 2014-05-31 DIAGNOSIS — A047 Enterocolitis due to Clostridium difficile: Secondary | ICD-10-CM | POA: Diagnosis not present

## 2014-05-31 DIAGNOSIS — I714 Abdominal aortic aneurysm, without rupture: Secondary | ICD-10-CM | POA: Diagnosis present

## 2014-05-31 DIAGNOSIS — Z91013 Allergy to seafood: Secondary | ICD-10-CM | POA: Diagnosis not present

## 2014-05-31 DIAGNOSIS — Z9981 Dependence on supplemental oxygen: Secondary | ICD-10-CM | POA: Diagnosis not present

## 2014-05-31 LAB — COMPREHENSIVE METABOLIC PANEL
ALT: 13 U/L (ref 0–35)
AST: 29 U/L (ref 0–37)
Albumin: 2.9 g/dL — ABNORMAL LOW (ref 3.5–5.2)
Alkaline Phosphatase: 57 U/L (ref 39–117)
Anion gap: 8 (ref 5–15)
BUN: 23 mg/dL (ref 6–23)
CO2: 39 mmol/L — ABNORMAL HIGH (ref 19–32)
Calcium: 8.4 mg/dL (ref 8.4–10.5)
Chloride: 95 mEq/L — ABNORMAL LOW (ref 96–112)
Creatinine, Ser: 0.84 mg/dL (ref 0.50–1.10)
GFR, EST AFRICAN AMERICAN: 67 mL/min — AB (ref >90)
GFR, EST NON AFRICAN AMERICAN: 58 mL/min — AB (ref >90)
Glucose, Bld: 127 mg/dL — ABNORMAL HIGH (ref 70–99)
Potassium: 3.3 mmol/L — ABNORMAL LOW (ref 3.5–5.1)
Sodium: 142 mmol/L (ref 135–145)
Total Bilirubin: 1.2 mg/dL (ref 0.3–1.2)
Total Protein: 5.9 g/dL — ABNORMAL LOW (ref 6.0–8.3)

## 2014-05-31 LAB — MRSA PCR SCREENING: MRSA by PCR: NEGATIVE

## 2014-05-31 LAB — GLUCOSE, CAPILLARY: Glucose-Capillary: 117 mg/dL — ABNORMAL HIGH (ref 70–99)

## 2014-05-31 LAB — CBC WITH DIFFERENTIAL/PLATELET
BASOS ABS: 0 10*3/uL (ref 0.0–0.1)
Basophils Relative: 0 % (ref 0–1)
Eosinophils Absolute: 0 10*3/uL (ref 0.0–0.7)
Eosinophils Relative: 0 % (ref 0–5)
HCT: 46.1 % — ABNORMAL HIGH (ref 36.0–46.0)
Hemoglobin: 14.7 g/dL (ref 12.0–15.0)
LYMPHS PCT: 4 % — AB (ref 12–46)
Lymphs Abs: 0.5 10*3/uL — ABNORMAL LOW (ref 0.7–4.0)
MCH: 31.4 pg (ref 26.0–34.0)
MCHC: 31.9 g/dL (ref 30.0–36.0)
MCV: 98.5 fL (ref 78.0–100.0)
MONO ABS: 1.1 10*3/uL — AB (ref 0.1–1.0)
Monocytes Relative: 7 % (ref 3–12)
Neutro Abs: 13.6 10*3/uL — ABNORMAL HIGH (ref 1.7–7.7)
Neutrophils Relative %: 89 % — ABNORMAL HIGH (ref 43–77)
Platelets: 283 10*3/uL (ref 150–400)
RBC: 4.68 MIL/uL (ref 3.87–5.11)
RDW: 15.4 % (ref 11.5–15.5)
WBC: 15.2 10*3/uL — AB (ref 4.0–10.5)

## 2014-05-31 LAB — TROPONIN I
Troponin I: 0.26 ng/mL — ABNORMAL HIGH (ref <0.031)
Troponin I: 0.21 ng/mL — ABNORMAL HIGH (ref <0.031)
Troponin I: 0.22 ng/mL — ABNORMAL HIGH (ref <0.031)

## 2014-05-31 MED ORDER — POTASSIUM CHLORIDE CRYS ER 20 MEQ PO TBCR
40.0000 meq | EXTENDED_RELEASE_TABLET | Freq: Once | ORAL | Status: AC
  Administered 2014-05-31: 40 meq via ORAL
  Filled 2014-05-31: qty 2

## 2014-05-31 MED ORDER — ADULT MULTIVITAMIN W/MINERALS CH
1.0000 | ORAL_TABLET | Freq: Every day | ORAL | Status: DC
  Administered 2014-05-31 – 2014-06-09 (×10): 1 via ORAL
  Filled 2014-05-31 (×10): qty 1

## 2014-05-31 MED ORDER — CALCITONIN (SALMON) 200 UNIT/ACT NA SOLN
1.0000 | Freq: Every day | NASAL | Status: DC
  Administered 2014-05-31 – 2014-06-09 (×9): 1 via NASAL
  Filled 2014-05-31: qty 3.7

## 2014-05-31 MED ORDER — ASPIRIN EC 325 MG PO TBEC
325.0000 mg | DELAYED_RELEASE_TABLET | Freq: Every day | ORAL | Status: DC
  Administered 2014-05-31 – 2014-06-09 (×10): 325 mg via ORAL
  Filled 2014-05-31 (×10): qty 1

## 2014-05-31 MED ORDER — FUROSEMIDE 20 MG PO TABS: 20.0000 mg | ORAL_TABLET | Freq: Three times a day (TID) | ORAL | Status: DC

## 2014-05-31 MED ORDER — DIFLUPREDNATE 0.05 % OP EMUL: 1.0000 [drp] | Freq: Two times a day (BID) | OPHTHALMIC | Status: DC

## 2014-05-31 MED ORDER — FUROSEMIDE 10 MG/ML IJ SOLN
40.0000 mg | Freq: Two times a day (BID) | INTRAMUSCULAR | Status: DC
  Administered 2014-05-31 – 2014-06-03 (×7): 40 mg via INTRAVENOUS
  Filled 2014-05-31 (×7): qty 4

## 2014-05-31 MED ORDER — ENOXAPARIN SODIUM 40 MG/0.4ML ~~LOC~~ SOLN
40.0000 mg | SUBCUTANEOUS | Status: DC
  Administered 2014-05-31 – 2014-06-09 (×10): 40 mg via SUBCUTANEOUS
  Filled 2014-05-31 (×10): qty 0.4

## 2014-05-31 MED ORDER — HYDRALAZINE HCL 20 MG/ML IJ SOLN: 10.0000 mg | INTRAMUSCULAR | Status: DC | PRN

## 2014-05-31 MED ORDER — DIGOXIN 125 MCG PO TABS
0.1250 mg | ORAL_TABLET | Freq: Every day | ORAL | Status: DC
  Administered 2014-05-31 – 2014-06-09 (×10): 0.125 mg via ORAL
  Filled 2014-05-31 (×10): qty 1

## 2014-05-31 MED ORDER — FLUTICASONE PROPIONATE 50 MCG/ACT NA SUSP
1.0000 | Freq: Every day | NASAL | Status: DC
  Administered 2014-05-31 – 2014-06-09 (×10): 1 via NASAL
  Filled 2014-05-31: qty 16

## 2014-05-31 MED ORDER — ONDANSETRON HCL 4 MG PO TABS: 4.0000 mg | ORAL_TABLET | Freq: Four times a day (QID) | ORAL | Status: DC | PRN

## 2014-05-31 MED ORDER — ACETAMINOPHEN 650 MG RE SUPP: 650.0000 mg | Freq: Four times a day (QID) | RECTAL | Status: DC | PRN

## 2014-05-31 MED ORDER — INSULIN ASPART 100 UNIT/ML ~~LOC~~ SOLN
0.0000 [IU] | Freq: Three times a day (TID) | SUBCUTANEOUS | Status: DC
  Administered 2014-06-02: 1 [IU] via SUBCUTANEOUS
  Administered 2014-06-02: 5 [IU] via SUBCUTANEOUS

## 2014-05-31 MED ORDER — DOCUSATE SODIUM 100 MG PO CAPS
300.0000 mg | ORAL_CAPSULE | Freq: Every day | ORAL | Status: DC
  Administered 2014-05-31 – 2014-06-04 (×6): 300 mg via ORAL
  Filled 2014-05-31 (×7): qty 3

## 2014-05-31 MED ORDER — METOPROLOL TARTRATE 50 MG PO TABS
50.0000 mg | ORAL_TABLET | Freq: Two times a day (BID) | ORAL | Status: DC
  Administered 2014-05-31 – 2014-06-09 (×19): 50 mg via ORAL
  Filled 2014-05-31 (×19): qty 1

## 2014-05-31 MED ORDER — CEFTRIAXONE SODIUM IN DEXTROSE 20 MG/ML IV SOLN
1.0000 g | INTRAVENOUS | Status: DC
  Administered 2014-05-31 – 2014-06-02 (×3): 1 g via INTRAVENOUS
  Filled 2014-05-31 (×5): qty 50

## 2014-05-31 MED ORDER — HYDRALAZINE HCL 50 MG PO TABS
50.0000 mg | ORAL_TABLET | Freq: Three times a day (TID) | ORAL | Status: DC
  Administered 2014-05-31 – 2014-06-09 (×28): 50 mg via ORAL
  Filled 2014-05-31 (×30): qty 1

## 2014-05-31 MED ORDER — ACETAMINOPHEN 325 MG PO TABS: 650.0000 mg | ORAL_TABLET | Freq: Four times a day (QID) | ORAL | Status: DC | PRN

## 2014-05-31 MED ORDER — POTASSIUM CHLORIDE 20 MEQ/15ML (10%) PO SOLN
20.0000 meq | Freq: Every day | ORAL | Status: DC
  Administered 2014-05-31: 20 meq via ORAL
  Filled 2014-05-31: qty 15

## 2014-05-31 MED ORDER — ONDANSETRON HCL 4 MG/2ML IJ SOLN: 4.0000 mg | Freq: Four times a day (QID) | INTRAMUSCULAR | Status: DC | PRN

## 2014-05-31 MED ORDER — SODIUM CHLORIDE 0.9 % IJ SOLN
3.0000 mL | Freq: Two times a day (BID) | INTRAMUSCULAR | Status: DC
  Administered 2014-05-31 – 2014-06-08 (×12): 3 mL via INTRAVENOUS

## 2014-05-31 MED ORDER — SODIUM CHLORIDE 0.9 % IJ SOLN
3.0000 mL | Freq: Two times a day (BID) | INTRAMUSCULAR | Status: DC
  Administered 2014-05-31 – 2014-06-08 (×11): 3 mL via INTRAVENOUS

## 2014-05-31 MED ORDER — PAROXETINE HCL 20 MG PO TABS
20.0000 mg | ORAL_TABLET | Freq: Every day | ORAL | Status: DC
  Administered 2014-05-31 – 2014-06-09 (×10): 20 mg via ORAL
  Filled 2014-05-31 (×10): qty 1

## 2014-05-31 MED ORDER — INFLUENZA VAC SPLIT QUAD 0.5 ML IM SUSY: 0.5000 mL | PREFILLED_SYRINGE | INTRAMUSCULAR | Status: DC | PRN

## 2014-05-31 NOTE — Progress Notes (Signed)
[facility] PROGRESS NOTE  Raaga Maeder XBJ:[id number] DOB: Nov 22, 1917 DOA: 05/30/2014 PCP: Antony Blackbird, MD  Assessment/Plan: #1 acute on chronic diastolic heart failure Patient is presented with a fall and shortness of breath. Physical exam with crackles noted on pulmonary exam. Pro BNP is elevated. Chest x-ray considering with a mild CHF. Cardiac enzymes slightly elevated. Patient with recent 2-D echo on 02/26/2014 with a EF of 65-70% with no wall motion abnormalities. Moderately thickened aortic valve calcified leaflets. Moderately dilated right ventricle with moderately impaired systolic function. Moderate pulmonary hypertension. Strict I's and O's. Daily weights. Will place patient on IV Lasix and diuresed. Cardiology following and appreciate input and recommendations.  #2 urinary tract infection Urine cultures are pending. Continue empiric IV Rocephin.  #3 Fall PT/OT.  #4 elevated troponin. Patient denies any chest pain. Aspirin. Patient with recent 2-D echo and per cardiology no need to repeat echo at this time. Patient denies any chest pain. It is elevated troponin may have been secondary to right ventricle a strain. Cardiology following and appreciate input and recommendations.  #5 atrial fibrillation Patient with chronic falls, and a such not an anticoagulation candidate. Continue Lopressor and digoxin for rate control.  #6 hypertension Stable. Continue Lopressor. And digoxin.  #7 diabetes mellitus Check A1c. Place on sliding scale insulin.  #8 leukocytosis Likely secondary to urinary tract infection. Cultures are pending. Continue IV Rocephin.  #9 history of abdominal aortic aneurysm Stable. Asymptomatic. Outpatient follow-up.  #10 prophylaxis Lovenox for DVT prophylaxis.   Code Status: Full Family Communication: Updated patient no family at bedside. Disposition Plan: Remain inpatient. Likely need SNF on discharge.   Consultants:  Cardiology: Dr. Acie Fredrickson  05/31/2014  Procedures:  CT head without contrast 05/30/2014  Chest x-ray 05/30/2014  X-ray of the L-spine 05/30/2014  Antibiotics:  IV Rocephin 05/31/2013  HPI/Subjective: Patient sitting in chair. Patient denies any chest pain. Patient with some shortness of breath. No complaints.  Objective: Filed Vitals:   05/31/14 0650  BP: 142/90  Pulse:   Temp:   Resp:     Intake/Output Summary (Last 24 hours) at 05/31/14 1354 Last data filed at 05/31/14 0839  Gross per 24 hour  Intake    603 ml  Output    300 ml  Net    303 ml   Filed Weights   05/31/14 0012  Weight: 62.5 kg (137 lb 12.6 oz)    Exam:   General:  NAD  Cardiovascular: RRR  Respiratory: Diffuse crackles. No rhonchi.  Abdomen: Soft, nontender, nondistended, positive bowel sounds.  Musculoskeletal: No clubbing or cyanosis. 1+ bilateral lower extremity edema.  Data Reviewed: Basic Metabolic Panel:  Recent Labs Lab 05/30/14 2057 05/31/14 0050  NA 142 142  K 3.7 3.3*  CL 95* 95*  CO2  --  39*  GLUCOSE 138* 127*  BUN 26* 23  CREATININE 0.90 0.84  CALCIUM  --  8.4   Liver Function Tests:  Recent Labs Lab 05/31/14 0050  AST 29  ALT 13  ALKPHOS 57  BILITOT 1.2  PROT 5.9*  ALBUMIN 2.9*   No results for input(s): LIPASE, AMYLASE in the last 168 hours. No results for input(s): AMMONIA in the last 168 hours. CBC:  Recent Labs Lab 05/30/14 2040 05/30/14 2057 05/31/14 0050  WBC 12.4*  --  15.2*  NEUTROABS 10.8*  --  13.6*  HGB 14.9 17.3* 14.7  HCT 47.3* 51.0* 46.1*  MCV 98.1  --  98.5  PLT 259  --  283   Cardiac  Enzymes:  Recent Labs Lab 05/30/14 2040 05/30/14 2106 05/31/14 0050 05/31/14 0547 05/31/14 1230  CKTOTAL 147  --   --   --   --   TROPONINI  --  0.21* 0.26* 0.21* 0.22*   BNP (last 3 results) No results for input(s): PROBNP in the last 8760 hours. CBG:  Recent Labs Lab 05/30/14 2213  GLUCAP 129*    Recent Results (from the past 240 hour(s))  MRSA PCR  Screening     Status: None   Collection Time: 05/31/14  3:30 AM  Result Value Ref Range Status   MRSA by PCR NEGATIVE NEGATIVE Final    Comment:        The GeneXpert MRSA Assay (FDA approved for NASAL specimens only), is one component of a comprehensive MRSA colonization surveillance program. It is not intended to diagnose MRSA infection nor to guide or monitor treatment for MRSA infections.      Studies: Dg Chest 2 View  05/30/2014   CLINICAL DATA:  Patient status post fall from assisted living at 5 this afternoon  EXAM: CHEST  2 VIEW  COMPARISON:  January 19, 2014  FINDINGS: The mediastinal contour is normal. The heart size is enlarged. The aorta is tortuous. There is central pulmonary edema. There are small bilateral pleural effusions. There is no focal pneumonia. Degenerative joint changes of bilateral shoulders are noted.  IMPRESSION: Mild congestive heart failure.  Small bilateral pleural effusions.   Electronically Signed   By: Abelardo Diesel M.D.   On: 05/30/2014 21:34   Dg Lumbar Spine Complete  05/30/2014   CLINICAL DATA:  Status post fall and nursing home 5 o'clock today.  EXAM: LUMBAR SPINE - COMPLETE 4+ VIEW  COMPARISON:  None.  FINDINGS: There are degenerative joint changes noted throughout lumbar spine. There is scoliosis of spine. There are compression deformities of L2, L1, T12, T11, T10, more prominently involving L2. These compression deformities are probably chronic but evaluation is limited without old films for comparison.  IMPRESSION: Scoliosis and degenerative joint changes throughout the spine. There are compression deformities of T10 through L2, probably chronic but assessment of chronicity is limited without old films for comparison. If the patient has focal pain at L2, acute component is not excluded.   Electronically Signed   By: Abelardo Diesel M.D.   On: 05/30/2014 21:37   Ct Head Wo Contrast  05/30/2014   CLINICAL DATA:  Initial encounter for Fall today and found on  floor beside bed about 5 p.m. today.  EXAM: CT HEAD WITHOUT CONTRAST  TECHNIQUE: Contiguous axial images were obtained from the base of the skull through the vertex without intravenous contrast.  COMPARISON:  None.  FINDINGS: There is no evidence for acute hemorrhage, hydrocephalus, mass lesion, or abnormal extra-axial fluid collection. No definite CT evidence for acute infarction. Diffuse loss of parenchymal volume is consistent with atrophy. Patchy low attenuation in the deep hemispheric and periventricular white matter is nonspecific, but likely reflects chronic microvascular ischemic demyelination. Old lacunar infarct identified in the left basal ganglia.  The visualized paranasal sinuses and mastoid air cells are clear.  IMPRESSION: No acute intracranial abnormality.  Atrophy with chronic small vessel white matter ischemic disease.   Electronically Signed   By: Misty Stanley M.D.   On: 05/30/2014 21:15    Scheduled Meds: . aspirin EC  325 mg Oral Daily  . calcitonin (salmon)  1 spray Alternating Nares Daily  . cefTRIAXone (ROCEPHIN)  IV  1 g Intravenous Q24H  .  Difluprednate  1 drop Both Eyes BID  . digoxin  0.125 mg Oral Daily  . docusate sodium  300 mg Oral QHS  . enoxaparin (LOVENOX) injection  40 mg Subcutaneous Q24H  . fluticasone  1 spray Each Nare Daily  . furosemide  40 mg Intravenous BID  . hydrALAZINE  50 mg Oral 3 times per day  . metoprolol  50 mg Oral BID  . multivitamin with minerals  1 tablet Oral Daily  . PARoxetine  20 mg Oral Daily  . potassium chloride  20 mEq Oral Daily  . sodium chloride  3 mL Intravenous Q12H  . sodium chloride  3 mL Intravenous Q12H   Continuous Infusions:   Principal Problem:   Acute on chronic diastolic heart failure Active Problems:   Fall   Elevated troponin   UTI (lower urinary tract infection)   Uncontrolled hypertension   UTI (urinary tract infection)    Time spent: 75 minutes    Cornisha Zetino M.D. [facility] Pager  [phone] If 7PM-7AM, please contact night-coverage at [email or web address], password [facility] 05/31/2014, 1:54 PM  LOS: 1 day

## 2014-05-31 NOTE — Progress Notes (Signed)
Occupational Therapy Evaluation Patient Details Name: Jean Murphy MRN: [id number] DOB: 05-12-1918 Today's Date: 05/31/2014    History of Present Illness 81 yo female admitted with fall. Hx of chronic Afib, CHF, CVA, AAA, HTN, DM. compresion fxs (T10, L2).    Clinical Impression   Patient very sleepy/lethargic during evaluation. Very limited. Will continue to assess and update recommendations.    Follow Up Recommendations  SNF;Supervision/Assistance - 24 hour    Equipment Recommendations  Other (comment) (tbd)    Recommendations for Other Services PT consult     Precautions / Restrictions Precautions Precautions: Fall Restrictions Weight Bearing Restrictions: No      Mobility Bed Mobility Overal bed mobility: Needs Assistance Bed Mobility: Supine to Sit     Supine to sit: Min assist     General bed mobility comments: Assist for trunk to upright. Increased time. VCs required.   Transfers Overall transfer level: Needs assistance Equipment used: Rolling walker (2 wheeled) Transfers: Sit to/from [facility] Sit to Stand: Min assist Stand pivot transfers: Min assist       General transfer comment: Assist to rise, stabilize, maneuver with walker, control descent. VCs for safety.     Balance                                            ADL Overall ADL's : Needs assistance/impaired Eating/Feeding: Sitting;Set up   Grooming: Wash/dry face;Minimal assistance;Sitting                                 General ADL Comments: Patient received in recliner. Very limited eval due to patient fatigue/sleepiness. Constant cues to stay awake. Will continue to assess next session.     Vision                     Perception     Praxis      Pertinent Vitals/Pain Pain Assessment: Faces Faces Pain Scale: Hurts a little bit Pain Location: unable to report Pain Intervention(s): Monitored during session;Limited activity  within patient's tolerance     Hand Dominance Right   Extremity/Trunk Assessment Upper Extremity Assessment Upper Extremity Assessment: Generalized weakness   Lower Extremity Assessment Lower Extremity Assessment: Defer to PT evaluation   Cervical / Trunk Assessment Cervical / Trunk Assessment: Kyphotic   Communication Communication Communication: No difficulties   Cognition Arousal/Alertness: Lethargic;Suspect due to medications Behavior During Therapy: [facility] for tasks assessed/performed Overall Cognitive Status: No family/caregiver present to determine baseline cognitive functioning Area of Impairment: Orientation;Attention;Memory;Problem solving Orientation Level: Disoriented to;Place;Time;Situation Current Attention Level: Sustained Memory: Decreased recall of precautions;Decreased short-term memory       Problem Solving: Requires verbal cues;Requires tactile cues General Comments: pt very sleepy on eval, needed constant cues to stay awake   General Comments       Exercises       Shoulder Instructions      Home Living Family/patient expects to be discharged to:: Unsure     Type of Home: Assisted living                       Home Equipment: Walker - 2 wheels   Additional Comments: unsure if pt is from ALF or Ind Living-pt was unable to tell therapist      Prior Functioning/Environment  Level of Independence: Needs assistance        Comments: it appears that she may have been in ALF, pt unable to report what she needed A with    OT Diagnosis: Generalized weakness   OT Problem List: Decreased strength;Decreased range of motion;Impaired balance (sitting and/or standing);Decreased safety awareness;Decreased cognition;Decreased knowledge of use of DME or AE   OT Treatment/Interventions: Self-care/ADL training;Therapeutic exercise;DME and/or AE instruction;Therapeutic activities;Patient/family education    OT Goals(Current goals can be found in the  care plan section) Acute Rehab OT Goals Patient Stated Goal: none stated OT Goal Formulation: Patient unable to participate in goal setting Time For Goal Achievement: 06/14/14 Potential to Achieve Goals: Fair  OT Frequency: Min 2X/week   Barriers to D/C:            Co-evaluation              End of Session    Activity Tolerance: Patient limited by fatigue;Patient limited by lethargy Patient left: in chair;with call bell/phone within reach   Time: 1120-1133 OT Time Calculation (min): 13 min Charges:  OT General Charges $OT Visit: 1 Procedure OT Evaluation $Initial OT Evaluation Tier I: 1 Procedure OT Treatments $Therapeutic Activity: 8-22 mins G-Codes: OT G-codes **NOT FOR INPATIENT CLASS** Functional Assessment Tool Used: clinical observation Functional Limitation: Self care Self Care Current Status (A9191): At least 60 percent but less than 80 percent impaired, limited or restricted Self Care Goal Status (Y6060): At least 20 percent but less than 40 percent impaired, limited or restricted  Jean Murphy A 05/31/2014, 12:19 PM

## 2014-05-31 NOTE — Progress Notes (Signed)
UR completed 

## 2014-05-31 NOTE — Consult Note (Signed)
CONSULT NOTE  Date: 05/31/2014               Patient Name:  Jean Murphy MRN: [id number]  DOB: 1918/04/25 Age / Sex: 81 y.o., female        PCP: FULP, CAMMIE Primary Cardiologist: Ragnar Waas            Referring Physician: Grandville Silos              Reason for Consult: + troponin in the setting of a fall.            History of Present Illness: Patient is a 81 y.o. female with a PMHx of Atrial fib, hx of falling, diastolic CHF, AAA , who was admitted to [facility] on 05/30/2014 for evaluation of falling at her assisted living facility.   Was found to have elevted troponin.  Denies any chest pain .   She was admitted to the hospital yesterday. She fell while trying to get out of bed. Apparently her walker was at the foot of her bed she was trying to turn herself around in bed. She somehow slid out of bed and was on the ground for many hours. She denies any loss of consciousness. She denies any chest pain or shortness breath.  She had an echocardiogram in October which revealed normal left ventricular systolic function. She does have some pulmonary hypertension.  Her x-ray and admission revealed some infiltrates in the right lung field.  Medications: Outpatient medications: Prescriptions prior to admission  Medication Sig Dispense Refill Last Dose  . acetaminophen (TYLENOL) 500 MG tablet Take 500 mg by mouth every 6 (six) hours as needed for mild pain or headache.    Past Week at Unknown time  . aspirin 81 MG tablet Take 81 mg by mouth daily.   05/29/2014 at Unknown time  . calcitonin, salmon, (MIACALCIN/FORTICAL) 200 UNIT/ACT nasal spray Place 1 spray into alternate nostrils daily.   Past Week at Unknown time  . Difluprednate (DUREZOL) 0.05 % EMUL Place 1 drop into both eyes 2 (two) times daily.    Past Week at Unknown time  . digoxin (LANOXIN) 0.125 MG tablet Take by mouth daily.   05/30/2014 at 2100  . docusate sodium (COLACE) 100 MG capsule Take 300 mg by mouth at bedtime.   4 unknown at  Unknown time  . fluticasone (FLONASE) 50 MCG/ACT nasal spray Place 1 spray into both nostrils daily.   12 unknown at Unknown time  . furosemide (LASIX) 20 MG tablet Take 20 mg by mouth 3 (three) times daily.    05/30/2014 at Unknown time  . hydrALAZINE (APRESOLINE) 50 MG tablet Take 50 mg by mouth every 8 (eight) hours.   05/30/2014 at 2100  . metoprolol (LOPRESSOR) 50 MG tablet Take 50 mg by mouth 2 (two) times daily.   05/30/2014 at 2100  . Multiple Vitamin (MULTIVITAMIN) tablet Take 1 tablet by mouth daily.   05/30/2014 at Unknown time  . Multiple Vitamins-Minerals (PRESERVISION AREDS 2 PO) Take 1 capsule by mouth daily.   05/30/2014 at Unknown time  . PARoxetine (PAXIL) 20 MG tablet Take 20 mg by mouth daily.   05/30/2014 at Unknown time  . potassium chloride 20 MEQ/15ML (10%) solution Take 20 mEq by mouth daily.   05/29/2014 at Unknown time    Current medications: Current Facility-Administered Medications  Medication Dose Route Frequency Provider Last Rate Last Dose  . acetaminophen (TYLENOL) tablet 650 mg  650 mg Oral Q6H PRN Doreatha Lew  Hal Hope, MD       Or  . acetaminophen (TYLENOL) suppository 650 mg  650 mg Rectal Q6H PRN [name], MD      . aspirin EC tablet 325 mg  325 mg Oral Daily [name], MD   325 mg at 05/31/14 0843  . calcitonin (salmon) (MIACALCIN/FORTICAL) nasal spray 1 spray  1 spray Alternating Nares Daily [name], MD      . cefTRIAXone (ROCEPHIN) 1 g in dextrose 5 % 50 mL IVPB - Premix  1 g Intravenous Q24H Leann Trefz Poindexter, RPH      . Difluprednate 0.05 % EMUL 1 drop  1 drop Both Eyes BID [name], MD      . digoxin [facility]) tablet 0.125 mg  0.125 mg Oral Daily [name], MD   0.125 mg at 05/31/14 0843  . docusate sodium (COLACE) capsule 300 mg  300 mg Oral QHS [name], MD   300 mg at 05/31/14 0257  . enoxaparin (LOVENOX) injection 40 mg  40 mg Subcutaneous Q24H [name], MD   40 mg at 05/31/14 0842    . fluticasone (FLONASE) 50 MCG/ACT nasal spray 1 spray  1 spray Each Nare Daily [name], MD   1 spray at 05/31/14 0843  . furosemide (LASIX) injection 40 mg  40 mg Intravenous BID Eugenie Filler, MD   40 mg at 05/31/14 0831  . hydrALAZINE (APRESOLINE) injection 10 mg  10 mg Intravenous Q4H PRN [name], MD      . hydrALAZINE (APRESOLINE) tablet 50 mg  50 mg Oral 3 times per day [name], MD   50 mg at 05/31/14 0734  . Influenza vac split quadrivalent PF (FLUARIX) injection 0.5 mL  0.5 mL Intramuscular Prior to discharge [name], MD      . metoprolol (LOPRESSOR) tablet 50 mg  50 mg Oral BID [name], MD   50 mg at 05/31/14 0843  . multivitamin with minerals tablet 1 tablet  1 tablet Oral Daily [name], MD   1 tablet at 05/31/14 [phone]  . ondansetron (ZOFRAN) tablet 4 mg  4 mg Oral Q6H PRN [name], MD       Or  . ondansetron [facility]) injection 4 mg  4 mg Intravenous Q6H PRN [name], MD      . PARoxetine (PAXIL) tablet 20 mg  20 mg Oral Daily [name], MD   20 mg at 05/31/14 0842  . potassium chloride 20 MEQ/15ML (10%) solution 20 mEq  20 mEq Oral Daily [name], MD   20 mEq at 05/31/14 0842  . sodium chloride 0.9 % injection 3 mL  3 mL Intravenous Q12H [name], MD   3 mL at 05/31/14 0848  . sodium chloride 0.9 % injection 3 mL  3 mL Intravenous Q12H [name], MD   3 mL at 05/31/14 0848     Allergies  Allergen Reactions  . Penicillins     unknown  . Shellfish Allergy     Hives       Past Medical History  Diagnosis Date  . CHF (congestive heart failure)   . Pleural effusion   . HTN (hypertension)   . Stroke   . Hyperlipidemia   . Diabetes mellitus without complication   . Compound fracture     left leg  . AAA (abdominal aortic aneurysm)   .  Eczema   . Compression fracture     x 2     Past Surgical History  Procedure Laterality Date  .  Cholecystectomy    . Abdominal hysterectomy    . Knee surgery      right knee   . Corneal transplant      Family History  Problem Relation Age of Onset  . Heart attack Mother 83  . Heart disease Mother     Social History:  reports that she has quit smoking. Her smoking use included Cigarettes. She smoked 0.00 packs per day for 20 years. She does not have any smokeless tobacco history on file. She reports that she does not drink alcohol or use illicit drugs.   Review of Systems: Constitutional:  denies fever, chills, diaphoresis, appetite change and fatigue.  HEENT: denies photophobia, eye pain, redness, hearing loss, ear pain, congestion, sore throat, rhinorrhea, sneezing, neck pain, neck stiffness and tinnitus.  Respiratory: denies SOB, DOE, cough, chest tightness, and wheezing.  Cardiovascular: denies chest pain, palpitations and leg swelling.  Gastrointestinal: denies nausea, vomiting, abdominal pain, diarrhea, constipation, blood in stool.  Genitourinary: denies dysuria, urgency, frequency, hematuria, flank pain and difficulty urinating.  Musculoskeletal: denies  myalgias, back pain, joint swelling, arthralgias and gait problem.   Skin: denies pallor, rash and wound.  Neurological: denies dizziness, seizures, syncope, weakness, light-headedness, numbness and headaches.   Hematological: denies adenopathy, easy bruising, personal or family bleeding history.  Psychiatric/ Behavioral: denies suicidal ideation, mood changes, confusion, nervousness, sleep disturbance and agitation.    Physical Exam: BP 142/90 mmHg  Pulse 65  Temp(Src) 97.7 F (36.5 C) (Oral)  Resp 22  Ht 5\' 3"  (1.6 m)  Wt 137 lb 12.6 oz (62.5 kg)  BMI 24.41 kg/m2  SpO2 97%  Wt Readings from Last 3 Encounters:  05/31/14 137 lb 12.6 oz (62.5 kg)  05/05/14 139 lb (63.05 kg)  01/30/14 158 lb (71.668 kg)    General: Vital signs reviewed and noted. Very elderly female in NAD   Head: Normocephalic, atraumatic,  sclera anicteric,   Neck: Supple. Negative for carotid bruits. No JVD   Lungs:  Clear bilaterally, no  wheezes, rales, or rhonchi. Breathing is normal   Heart: Irreg. Irreg.    Abdomen:  Soft, non-tender, non-distended with normoactive bowel sounds. No hepatomegaly. No rebound/guarding. No obvious abdominal masses   MSK: Strength and the appear normal for age.   Extremities: No clubbing or cyanosis. No edema.  Distal pedal pulses are 2+ and equal   Neurologic: Alert and oriented X 3. Moves all extremities spontaneously.  Psych: Responds to questions appropriately with a normal affect.     Lab results: Basic Metabolic Panel:  Recent Labs Lab 05/30/14 2057 05/31/14 0050  NA 142 142  K 3.7 3.3*  CL 95* 95*  CO2  --  39*  GLUCOSE 138* 127*  BUN 26* 23  CREATININE 0.90 0.84  CALCIUM  --  8.4    Liver Function Tests:  Recent Labs Lab 05/31/14 0050  AST 29  ALT 13  ALKPHOS 57  BILITOT 1.2  PROT 5.9*  ALBUMIN 2.9*   No results for input(s): LIPASE, AMYLASE in the last 168 hours. No results for input(s): AMMONIA in the last 168 hours.  CBC:  Recent Labs Lab 05/30/14 2040 05/30/14 2057 05/31/14 0050  WBC 12.4*  --  15.2*  NEUTROABS 10.8*  --  13.6*  HGB 14.9 17.3* 14.7  HCT 47.3* 51.0* 46.1*  MCV 98.1  --  98.5  PLT 259  --  283    Cardiac Enzymes:  Recent Labs Lab 05/30/14 2040 05/30/14 2106 05/31/14 0050 05/31/14 0547  CKTOTAL 147  --   --   --   TROPONINI  --  0.21* 0.26* 0.21*    BNP: Invalid input(s): POCBNP  CBG:  Recent Labs Lab 05/30/14 2213  GLUCAP 129*    Coagulation Studies: No results for input(s): LABPROT, INR in the last 72 hours.   Other results:  Tele:  Atrial fib rate of 72     Imaging: Dg Chest 2 View  05/30/2014   CLINICAL DATA:  Patient status post fall from assisted living at 5 this afternoon  EXAM: CHEST  2 VIEW  COMPARISON:  January 19, 2014  FINDINGS: The mediastinal contour is normal. The heart size is  enlarged. The aorta is tortuous. There is central pulmonary edema. There are small bilateral pleural effusions. There is no focal pneumonia. Degenerative joint changes of bilateral shoulders are noted.  IMPRESSION: Mild congestive heart failure.  Small bilateral pleural effusions.   Electronically Signed   By: Abelardo Diesel M.D.   On: 05/30/2014 21:34   Dg Lumbar Spine Complete  05/30/2014   CLINICAL DATA:  Status post fall and nursing home 5 o'clock today.  EXAM: LUMBAR SPINE - COMPLETE 4+ VIEW  COMPARISON:  None.  FINDINGS: There are degenerative joint changes noted throughout lumbar spine. There is scoliosis of spine. There are compression deformities of L2, L1, T12, T11, T10, more prominently involving L2. These compression deformities are probably chronic but evaluation is limited without old films for comparison.  IMPRESSION: Scoliosis and degenerative joint changes throughout the spine. There are compression deformities of T10 through L2, probably chronic but assessment of chronicity is limited without old films for comparison. If the patient has focal pain at L2, acute component is not excluded.   Electronically Signed   By: Abelardo Diesel M.D.   On: 05/30/2014 21:37   Ct Head Wo Contrast  05/30/2014   CLINICAL DATA:  Initial encounter for Fall today and found on floor beside bed about 5 p.m. today.  EXAM: CT HEAD WITHOUT CONTRAST  TECHNIQUE: Contiguous axial images were obtained from the base of the skull through the vertex without intravenous contrast.  COMPARISON:  None.  FINDINGS: There is no evidence for acute hemorrhage, hydrocephalus, mass lesion, or abnormal extra-axial fluid collection. No definite CT evidence for acute infarction. Diffuse loss of parenchymal volume is consistent with atrophy. Patchy low attenuation in the deep hemispheric and periventricular white matter is nonspecific, but likely reflects chronic microvascular ischemic demyelination. Old lacunar infarct identified in the left  basal ganglia.  The visualized paranasal sinuses and mastoid air cells are clear.  IMPRESSION: No acute intracranial abnormality.  Atrophy with chronic small vessel white matter ischemic disease.   Electronically Signed   By: Misty Stanley M.D.   On: 05/30/2014 21:15     Troponin = 0.21    Assessment & Plan:  1.  Elevated troponin level: Patient has very mildly elevated troponin levels.   She is currently very comfortable. She's not having any acute distress. She does have pulmonary hypertension and this elevated troponin level may be due to RV strain. She had an echo card gram in October which shows normal left ventricle systolic function. She has atrial fibrillation so we were not able to comment on her diastolic dysfunction but she almost certainly has some degree of diastolic dysfunction at age 20 years.  I do not think that she needs any additional workup for this elevated troponin level. She's not a candidate for cardiac catheterization.  2. Atrial fibrillation: The patient has a history of multiple falls. She is not on any quite relation because of this. I discussed this in detail with her family at previous office visits. They understand.  3. Elevated white blood cell count:    she should be evaluated for possible infections-? UTI.  4. Diastolic congestive heart failure: The patient does appear to volume overloaded on x-ray. I think it would be appropriate to diurese her gently for the next couple days.  I'll be happy to see her in the office in follow-up. Will sign off.  Call for questions.    Thayer Headings, Brooke Bonito., MD, [facility] 05/31/2014, 8:58 AM Office - [phone] Pager [phone]

## 2014-05-31 NOTE — Evaluation (Signed)
Physical Therapy Evaluation Patient Details Name: Jean Murphy MRN: [id number] DOB: Apr 23, 1918 Today's Date: 05/31/2014   History of Present Illness  81 yo female admitted with fall. Hx of chronic Afib, CHF, CVA, AAA, HTN, DM. compresion fxs (T10, L2).   Clinical Impression  On eval, pt required Min assist for mobility-able to ambulate ~100 feet with RW. Pt tolerated activity well. Pt appeared confused at time of eval-no family present. Pt unable to provide PLOF/home environment info so unsure if she is from ALF or Ind Living. If pt is from ALF and facility can provide supervision and current level of assist, she could return. If she is from Ind Living, may need to consider SNF placement.     Follow Up Recommendations Supervision/Assistance - 24 hour;Home health PT.(If pt is from ALF-unsure at time of eval-and facility can provide current level of care, then she may be able to go back to ALF. If not then may need to consider SNF)    Equipment Recommendations  None recommended by PT    Recommendations for Other Services       Precautions / Restrictions Precautions Precautions: Fall Restrictions Weight Bearing Restrictions: No      Mobility  Bed Mobility Overal bed mobility: Needs Assistance Bed Mobility: Supine to Sit     Supine to sit: Min assist     General bed mobility comments: Assist for trunk to upright. Increased time. VCs required.   Transfers Overall transfer level: Needs assistance Equipment used: Rolling walker (2 wheeled) Transfers: Sit to/from [facility] Sit to Stand: Min assist Stand pivot transfers: Min assist       General transfer comment: Assist to rise, stabilize, maneuver with walker, control descent. VCs for safety.   Ambulation/Gait Ambulation/Gait assistance: Min assist Ambulation Distance (Feet): 100 Feet Assistive device: Rolling walker (2 wheeled) Gait Pattern/deviations: Step-through pattern;Decreased stride length      General Gait Details: unsteady requiring Min assist at times, especially during turns. Pt tolerated activity well. O2 sats 85% on RA  Stairs            Wheelchair Mobility    Modified Rankin (Stroke Patients Only)       Balance                                             Pertinent Vitals/Pain Pain Assessment: Faces Faces Pain Scale: Hurts little more Pain Location: buttocks Pain Intervention(s): Monitored during session    Home Living Family/patient expects to be discharged to:: Unsure     Type of Home: Assisted living         Home Equipment: Walker - 2 wheels Additional Comments: unsure if pt is from ALF or Ind Living-pt was unable to tell therapist    Prior Function                 Hand Dominance        Extremity/Trunk Assessment   Upper Extremity Assessment: Defer to OT evaluation           Lower Extremity Assessment: Generalized weakness      Cervical / Trunk Assessment: Kyphotic  Communication   Communication: No difficulties  Cognition Arousal/Alertness: Awake/alert Behavior During Therapy: WFL for tasks assessed/performed Overall Cognitive Status: No family/caregiver present to determine baseline cognitive functioning Area of Impairment: Orientation;Attention;Memory;Problem solving Orientation Level: Disoriented to;Place;Time;Situation Current Attention Level: Sustained Memory: Decreased  recall of precautions;Decreased short-term memory       Problem Solving: Requires verbal cues;Requires tactile cues      General Comments      Exercises        Assessment/Plan    PT Assessment Patient needs continued PT services  PT Diagnosis Difficulty walking;Generalized weakness;Altered mental status   PT Problem List Decreased strength;Decreased activity tolerance;Decreased balance;Decreased mobility;Decreased knowledge of use of DME;Decreased cognition  PT Treatment Interventions DME instruction;Gait  training;Functional mobility training;Therapeutic activities;Therapeutic exercise;Patient/family education;Balance training   PT Goals (Current goals can be found in the Care Plan section) Acute Rehab PT Goals Patient Stated Goal: none stated PT Goal Formulation: Patient unable to participate in goal setting Time For Goal Achievement: 06/14/14 Potential to Achieve Goals: Good    Frequency Min 3X/week   Barriers to discharge        Co-evaluation               End of Session Equipment Utilized During Treatment: Gait belt Activity Tolerance: Patient tolerated treatment well Patient left: in chair;with call bell/phone within reach;with chair alarm set      Functional Assessment Tool Used: clinical judgement Functional Limitation: Mobility: Walking and moving around Mobility: Walking and Moving Around Current Status [phone]): At least 20 percent but less than 40 percent impaired, limited or restricted Mobility: Walking and Moving Around Goal Status [phone]): At least 1 percent but less than 20 percent impaired, limited or restricted    Time: 1010-1036 PT Time Calculation (min) (ACUTE ONLY): 26 min   Charges:   PT Evaluation $Initial PT Evaluation Tier I: 1 Procedure PT Treatments $Gait Training: 8-22 mins $Therapeutic Activity: 8-22 mins   PT G Codes:   PT G-Codes **NOT FOR INPATIENT CLASS** Functional Assessment Tool Used: clinical judgement Functional Limitation: Mobility: Walking and moving around Mobility: Walking and Moving Around Current Status ([id number]): At least 20 percent but less than 40 percent impaired, limited or restricted Mobility: Walking and Moving Around Goal Status [phone]): At least 1 percent but less than 20 percent impaired, limited or restricted    Weston Anna, MPT Pager: [phone]

## 2014-05-31 NOTE — Progress Notes (Signed)
PHARMACY - CEFTRIAXONE  Pharmacy requested to dose Ceftriaxone for UTI in this 81 yr female  Scr = 0.90 Weight = 62.5 kg Height = 63 inches CrCl ~ 31 ml/min  No renal adjustment needed for Ceftriaxone  Plan:  Ceftriaxone 1gm IV q24h           Will sign-off as no further adjustment necessary  Leone Haven, PharmD

## 2014-06-01 LAB — BASIC METABOLIC PANEL
Anion gap: 7 (ref 5–15)
BUN: 24 mg/dL — ABNORMAL HIGH (ref 6–23)
CALCIUM: 8 mg/dL — AB (ref 8.4–10.5)
CO2: 38 mmol/L — ABNORMAL HIGH (ref 19–32)
Chloride: 96 mEq/L (ref 96–112)
Creatinine, Ser: 0.67 mg/dL (ref 0.50–1.10)
GFR, EST AFRICAN AMERICAN: 85 mL/min — AB (ref >90)
GFR, EST NON AFRICAN AMERICAN: 73 mL/min — AB (ref >90)
Glucose, Bld: 86 mg/dL (ref 70–99)
POTASSIUM: 3.3 mmol/L — AB (ref 3.5–5.1)
SODIUM: 141 mmol/L (ref 135–145)

## 2014-06-01 LAB — CBC
HCT: 38.3 % (ref 36.0–46.0)
Hemoglobin: 12.3 g/dL (ref 12.0–15.0)
MCH: 32 pg (ref 26.0–34.0)
MCHC: 32.1 g/dL (ref 30.0–36.0)
MCV: 99.7 fL (ref 78.0–100.0)
PLATELETS: 233 10*3/uL (ref 150–400)
RBC: 3.84 MIL/uL — AB (ref 3.87–5.11)
RDW: 15.6 % — ABNORMAL HIGH (ref 11.5–15.5)
WBC: 8.4 10*3/uL (ref 4.0–10.5)

## 2014-06-01 LAB — GLUCOSE, CAPILLARY
GLUCOSE-CAPILLARY: 95 mg/dL (ref 70–99)
Glucose-Capillary: 77 mg/dL (ref 70–99)
Glucose-Capillary: 99 mg/dL (ref 70–99)
Glucose-Capillary: 114 mg/dL — ABNORMAL HIGH (ref 70–99)

## 2014-06-01 LAB — MAGNESIUM: MAGNESIUM: 1.5 mg/dL (ref 1.5–2.5)

## 2014-06-01 LAB — HEMOGLOBIN A1C
HEMOGLOBIN A1C: 5.9 % — AB (ref <5.7)
MEAN PLASMA GLUCOSE: 123 mg/dL — AB (ref <117)

## 2014-06-01 MED ORDER — POTASSIUM CHLORIDE CRYS ER 20 MEQ PO TBCR
40.0000 meq | EXTENDED_RELEASE_TABLET | Freq: Once | ORAL | Status: AC
  Administered 2014-06-01: 40 meq via ORAL
  Filled 2014-06-01: qty 2

## 2014-06-01 MED ORDER — MAGNESIUM SULFATE 2 GM/50ML IV SOLN
2.0000 g | Freq: Once | INTRAVENOUS | Status: AC
  Administered 2014-06-01: 2 g via INTRAVENOUS
  Filled 2014-06-01: qty 50

## 2014-06-01 NOTE — Progress Notes (Signed)
[facility] PROGRESS NOTE  Kindle Strohmeier VZC:[id number] DOB: 1918-03-01 DOA: 05/30/2014 PCP: Antony Blackbird, MD  Assessment/Plan: #1 acute on chronic diastolic heart failure Patient is presented with a fall and shortness of breath. Physical exam with crackles noted on pulmonary exam. Pro BNP is elevated. Chest x-ray considering with a mild CHF. Cardiac enzymes slightly elevated. Patient with recent 2-D echo on 02/26/2014 with a EF of 65-70% with no wall motion abnormalities. Moderately thickened aortic valve calcified leaflets. Moderately dilated right ventricle with moderately impaired systolic function. Moderate pulmonary hypertension. Strict I's and O's. Daily weights. I/O = -784. Continue IV Lasix. Cardiology following and appreciate input and recommendations.  #2 urinary tract infection Check urine cultures. Continue empiric IV Rocephin.  #3 Fall PT/OT.  #4 elevated troponin. Patient denies any chest pain. Aspirin. Patient with recent 2-D echo and per cardiology no need to repeat echo at this time. Patient denies any chest pain. It is elevated troponin may have been secondary to right ventricle a strain. Cardiology following and appreciate input and recommendations.  #5 atrial fibrillation Patient with chronic falls, and a such not an anticoagulation candidate. Continue Lopressor and digoxin for rate control.  #6 hypertension Stable. Continue Lopressor. And digoxin.  #7 diabetes mellitus  Hgb A1c = 5.9. SSI  #8 leukocytosis Likely secondary to urinary tract infection. Check urine cultures. Continue IV Rocephin.  #9 history of abdominal aortic aneurysm Stable. Asymptomatic. Outpatient follow-up.  #10 prophylaxis Lovenox for DVT prophylaxis.   Code Status: Full Family Communication: Updated patient no family at bedside. Disposition Plan: Remain inpatient. Likely need SNF on discharge.   Consultants:  Cardiology: Dr. Acie Fredrickson 05/31/2014  Procedures:  CT head without  contrast 05/30/2014  Chest x-ray 05/30/2014  X-ray of the L-spine 05/30/2014  Antibiotics:  IV Rocephin 05/31/2013  HPI/Subjective: Patient sitting in chair. Patient denies any chest pain. Patient with some shortness of breath. No complaints.  Objective: Filed Vitals:   06/01/14 1010  BP: 150/72  Pulse: 80  Temp:   Resp:     Intake/Output Summary (Last 24 hours) at 06/01/14 1331 Last data filed at 06/01/14 1100  Gross per 24 hour  Intake    893 ml  Output   1600 ml  Net   -707 ml   Filed Weights   05/31/14 0012 06/01/14 0438  Weight: 62.5 kg (137 lb 12.6 oz) 61.9 kg (136 lb 7.4 oz)    Exam:   General:  NAD  Cardiovascular: RRR  Respiratory: Decreased BS in bases. No rhonchi.  Abdomen: Soft, nontender, nondistended, positive bowel sounds.  Musculoskeletal: No clubbing or cyanosis. 1+ bilateral lower extremity edema.  Data Reviewed: Basic Metabolic Panel:  Recent Labs Lab 05/30/14 2057 05/31/14 0050 06/01/14 0439  NA 142 142 141  K 3.7 3.3* 3.3*  CL 95* 95* 96  CO2  --  39* 38*  GLUCOSE 138* 127* 86  BUN 26* 23 24*  CREATININE 0.90 0.84 0.67  CALCIUM  --  8.4 8.0*  MG  --   --  1.5   Liver Function Tests:  Recent Labs Lab 05/31/14 0050  AST 29  ALT 13  ALKPHOS 57  BILITOT 1.2  PROT 5.9*  ALBUMIN 2.9*   No results for input(s): LIPASE, AMYLASE in the last 168 hours. No results for input(s): AMMONIA in the last 168 hours. CBC:  Recent Labs Lab 05/30/14 2040 05/30/14 2057 05/31/14 0050 06/01/14 0439  WBC 12.4*  --  15.2* 8.4  NEUTROABS 10.8*  --  13.6*  --  HGB 14.9 17.3* 14.7 12.3  HCT 47.3* 51.0* 46.1* 38.3  MCV 98.1  --  98.5 99.7  PLT 259  --  283 233   Cardiac Enzymes:  Recent Labs Lab 05/30/14 2040 05/30/14 2106 05/31/14 0050 05/31/14 0547 05/31/14 1230  CKTOTAL 147  --   --   --   --   TROPONINI  --  0.21* 0.26* 0.21* 0.22*   BNP (last 3 results) No results for input(s): PROBNP in the last 8760  hours. CBG:  Recent Labs Lab 05/30/14 2213 05/31/14 2035 06/01/14 0733 06/01/14 1147  GLUCAP 129* 117* 77 99    Recent Results (from the past 240 hour(s))  MRSA PCR Screening     Status: None   Collection Time: 05/31/14  3:30 AM  Result Value Ref Range Status   MRSA by PCR NEGATIVE NEGATIVE Final    Comment:        The GeneXpert MRSA Assay (FDA approved for NASAL specimens only), is one component of a comprehensive MRSA colonization surveillance program. It is not intended to diagnose MRSA infection nor to guide or monitor treatment for MRSA infections.   Culture, blood (routine x 2)     Status: None (Preliminary result)   Collection Time: 05/31/14  9:43 AM  Result Value Ref Range Status   Specimen Description BLOOD RIGHT ARM  Final   Special Requests   Final    BOTTLES DRAWN AEROBIC AND ANAEROBIC 10CC BOTH BOTTLES   Culture   Final           BLOOD CULTURE RECEIVED NO GROWTH TO DATE CULTURE WILL BE HELD FOR 5 DAYS BEFORE ISSUING A FINAL NEGATIVE REPORT Performed at [facility]    Report Status PENDING  Incomplete  Culture, blood (routine x 2)     Status: None (Preliminary result)   Collection Time: 05/31/14 12:30 PM  Result Value Ref Range Status   Specimen Description BLOOD RIGHT ARM  Final   Special Requests   Final    BOTTLES DRAWN AEROBIC AND ANAEROBIC 10CC BOTH BOTTLES   Culture   Final           BLOOD CULTURE RECEIVED NO GROWTH TO DATE CULTURE WILL BE HELD FOR 5 DAYS BEFORE ISSUING A FINAL NEGATIVE REPORT Performed at [facility]    Report Status PENDING  Incomplete     Studies: Dg Chest 2 View  05/30/2014   CLINICAL DATA:  Patient status post fall from assisted living at 5 this afternoon  EXAM: CHEST  2 VIEW  COMPARISON:  January 19, 2014  FINDINGS: The mediastinal contour is normal. The heart size is enlarged. The aorta is tortuous. There is central pulmonary edema. There are small bilateral pleural effusions. There is no focal pneumonia.  Degenerative joint changes of bilateral shoulders are noted.  IMPRESSION: Mild congestive heart failure.  Small bilateral pleural effusions.   Electronically Signed   By: Abelardo Diesel M.D.   On: 05/30/2014 21:34   Dg Lumbar Spine Complete  05/30/2014   CLINICAL DATA:  Status post fall and nursing home 5 o'clock today.  EXAM: LUMBAR SPINE - COMPLETE 4+ VIEW  COMPARISON:  None.  FINDINGS: There are degenerative joint changes noted throughout lumbar spine. There is scoliosis of spine. There are compression deformities of L2, L1, T12, T11, T10, more prominently involving L2. These compression deformities are probably chronic but evaluation is limited without old films for comparison.  IMPRESSION: Scoliosis and degenerative joint changes throughout the spine. There  are compression deformities of T10 through L2, probably chronic but assessment of chronicity is limited without old films for comparison. If the patient has focal pain at L2, acute component is not excluded.   Electronically Signed   By: Abelardo Diesel M.D.   On: 05/30/2014 21:37   Ct Head Wo Contrast  05/30/2014   CLINICAL DATA:  Initial encounter for Fall today and found on floor beside bed about 5 p.m. today.  EXAM: CT HEAD WITHOUT CONTRAST  TECHNIQUE: Contiguous axial images were obtained from the base of the skull through the vertex without intravenous contrast.  COMPARISON:  None.  FINDINGS: There is no evidence for acute hemorrhage, hydrocephalus, mass lesion, or abnormal extra-axial fluid collection. No definite CT evidence for acute infarction. Diffuse loss of parenchymal volume is consistent with atrophy. Patchy low attenuation in the deep hemispheric and periventricular white matter is nonspecific, but likely reflects chronic microvascular ischemic demyelination. Old lacunar infarct identified in the left basal ganglia.  The visualized paranasal sinuses and mastoid air cells are clear.  IMPRESSION: No acute intracranial abnormality.  Atrophy  with chronic small vessel white matter ischemic disease.   Electronically Signed   By: Misty Stanley M.D.   On: 05/30/2014 21:15    Scheduled Meds: . aspirin EC  325 mg Oral Daily  . calcitonin (salmon)  1 spray Alternating Nares Daily  . cefTRIAXone (ROCEPHIN)  IV  1 g Intravenous Q24H  . Difluprednate  1 drop Both Eyes BID  . digoxin  0.125 mg Oral Daily  . docusate sodium  300 mg Oral QHS  . enoxaparin (LOVENOX) injection  40 mg Subcutaneous Q24H  . fluticasone  1 spray Each Nare Daily  . furosemide  40 mg Intravenous BID  . hydrALAZINE  50 mg Oral 3 times per day  . insulin aspart  0-9 Units Subcutaneous TID WC  . metoprolol  50 mg Oral BID  . multivitamin with minerals  1 tablet Oral Daily  . PARoxetine  20 mg Oral Daily  . potassium chloride  20 mEq Oral Daily  . sodium chloride  3 mL Intravenous Q12H  . sodium chloride  3 mL Intravenous Q12H   Continuous Infusions:   Principal Problem:   Acute on chronic diastolic heart failure Active Problems:   Fall   Elevated troponin   UTI (lower urinary tract infection)   Uncontrolled hypertension   UTI (urinary tract infection)    Time spent: 3 minutes    Kersten Salmons M.D. [facility] Pager [phone] If 7PM-7AM, please contact night-coverage at [email or web address], password [facility] 06/01/2014, 1:31 PM  LOS: 2 days

## 2014-06-01 NOTE — Progress Notes (Signed)
Clinical Social Work Department CLINICAL SOCIAL WORK PLACEMENT NOTE 06/01/2014  Patient:  Jean Murphy, Jean Murphy  Account Number:  [id number] Admit date:  05/30/2014  Clinical Social Worker:  Renold Genta  Date/time:  06/01/2014 03:27 PM  Clinical Social Work is seeking post-discharge placement for this patient at the following level of care:   SKILLED NURSING   (*CSW will update this form in Epic as items are completed)   06/01/2014  Patient/family provided with [facility] Department of Clinical Social Work's list of facilities offering this level of care within the geographic area requested by the patient (or if unable, by the patient's family).  06/01/2014  Patient/family informed of their freedom to choose among providers that offer the needed level of care, that participate in Medicare, Medicaid or managed care program needed by the patient, have an available bed and are willing to accept the patient.  06/01/2014  Patient/family informed of [facility]' ownership interest in [facility], as well as of the fact that they are under no obligation to receive care at this facility.  PASARR submitted to EDS on 06/01/2014 PASARR number received on 06/01/2014  FL2 transmitted to all facilities in geographic area requested by pt/family on  06/01/2014 FL2 transmitted to all facilities within larger geographic area on   Patient informed that his/her managed care company has contracts with or will negotiate with  certain facilities, including the following:   [employer]     Patient/family informed of bed offers received:  06/01/2014 Patient chooses bed at  Physician recommends and patient chooses bed at    Patient to be transferred to  on   Patient to be transferred to facility by  Patient and family notified of transfer on  Name of family member notified:    The following physician request were entered in Epic:   Additional Comments:     [name], [place] Social Worker cell #: [phone]

## 2014-06-01 NOTE — Progress Notes (Signed)
Subjective:  No chest pain; breathing better  Objective:   Vital Signs in the last 24 hours: Temp:  [97.8 F (36.6 C)-98.2 F (36.8 C)] 97.8 F (36.6 C) (01/04 0438) Pulse Rate:  [59-72] 71 (01/04 0438) Resp:  [18] 18 (01/04 0438) BP: (135-152)/(58-94) 147/78 mmHg (01/04 0438) SpO2:  [93 %-99 %] 93 % (01/04 0438) Weight:  [136 lb 7.4 oz (61.9 kg)] 136 lb 7.4 oz (61.9 kg) (01/04 0438)  Intake/Output from previous day: 01/03 0701 - 01/04 0700 In: 1013 [P.O.:960; I.V.:3; IV Piggyback:50] Out: 1600 [Urine:1600] Net:  -587  I/O since admission:-524   Medications: . aspirin EC  325 mg Oral Daily  . calcitonin (salmon)  1 spray Alternating Nares Daily  . cefTRIAXone (ROCEPHIN)  IV  1 g Intravenous Q24H  . Difluprednate  1 drop Both Eyes BID  . digoxin  0.125 mg Oral Daily  . docusate sodium  300 mg Oral QHS  . enoxaparin (LOVENOX) injection  40 mg Subcutaneous Q24H  . fluticasone  1 spray Each Nare Daily  . furosemide  40 mg Intravenous BID  . hydrALAZINE  50 mg Oral 3 times per day  . insulin aspart  0-9 Units Subcutaneous TID WC  . metoprolol  50 mg Oral BID  . multivitamin with minerals  1 tablet Oral Daily  . PARoxetine  20 mg Oral Daily  . potassium chloride  20 mEq Oral Daily  . potassium chloride  40 mEq Oral Once  . sodium chloride  3 mL Intravenous Q12H  . sodium chloride  3 mL Intravenous Q12H       Physical Exam:   General appearance: alert, cooperative and no distress Neck: no adenopathy, no carotid bruit, no JVD, supple, symmetrical, trachea midline and thyroid not enlarged, symmetric, no tenderness/mass/nodules Lungs: decreased BS at bases Heart: irregularly irregular rhythm Abdomen: soft, non-tender; bowel sounds normal; no masses,  no organomegaly Extremities: 1+ LE pitting edema Neurologic: Grossly normal   Rate: 65  Rhythm: atrial fibrillation  ECG (independently read by me): Wandering baseline  With probable AF 88  Lab Results:  BMP  Latest Ref Rng 06/01/2014 05/31/2014 05/30/2014  Glucose 70 - 99 mg/dL 86 127(H) 138(H)  BUN 6 - 23 mg/dL 24(H) 23 26(H)  Creatinine 0.50 - 1.10 mg/dL 0.67 0.84 0.90  Sodium 135 - 145 mmol/L 141 142 142  Potassium 3.5 - 5.1 mmol/L 3.3(L) 3.3(L) 3.7  Chloride 96 - 112 mEq/L 96 95(L) 95(L)  CO2 19 - 32 mmol/L 38(H) 39(H) -  Calcium 8.4 - 10.5 mg/dL 8.0(L) 8.4 -     CBC Latest Ref Rng 06/01/2014 05/31/2014 05/30/2014  WBC 4.0 - 10.5 K/uL 8.4 15.2(H) -  Hemoglobin 12.0 - 15.0 g/dL 12.3 14.7 17.3(H)  Hematocrit 36.0 - 46.0 % 38.3 46.1(H) 51.0(H)  Platelets 150 - 400 K/uL 233 283 -      Recent Labs  05/31/14 0547 05/31/14 1230  TROPONINI 0.21* 0.22*    Hepatic Function Panel  Recent Labs  05/31/14 0050  PROT 5.9*  ALBUMIN 2.9*  AST 29  ALT 13  ALKPHOS 57  BILITOT 1.2   No results for input(s): INR in the last 72 hours. BNP (last 3 results)   BNP (not Pro-BNP) 691  Lipid Panel  No results found for: CHOL, TRIG, HDL, CHOLHDL, VLDL, LDLCALC, LDLDIRECT    Imaging:  Dg Chest 2 View  05/30/2014   CLINICAL DATA:  Patient status post fall from assisted living at 5 this afternoon  EXAM:  CHEST  2 VIEW  COMPARISON:  January 19, 2014  FINDINGS: The mediastinal contour is normal. The heart size is enlarged. The aorta is tortuous. There is central pulmonary edema. There are small bilateral pleural effusions. There is no focal pneumonia. Degenerative joint changes of bilateral shoulders are noted.  IMPRESSION: Mild congestive heart failure.  Small bilateral pleural effusions.   Electronically Signed   By: Abelardo Diesel M.D.   On: 05/30/2014 21:34   Dg Lumbar Spine Complete  05/30/2014   CLINICAL DATA:  Status post fall and nursing home 5 o'clock today.  EXAM: LUMBAR SPINE - COMPLETE 4+ VIEW  COMPARISON:  None.  FINDINGS: There are degenerative joint changes noted throughout lumbar spine. There is scoliosis of spine. There are compression deformities of L2, L1, T12, T11, T10, more prominently  involving L2. These compression deformities are probably chronic but evaluation is limited without old films for comparison.  IMPRESSION: Scoliosis and degenerative joint changes throughout the spine. There are compression deformities of T10 through L2, probably chronic but assessment of chronicity is limited without old films for comparison. If the patient has focal pain at L2, acute component is not excluded.   Electronically Signed   By: Abelardo Diesel M.D.   On: 05/30/2014 21:37   Ct Head Wo Contrast  05/30/2014   CLINICAL DATA:  Initial encounter for Fall today and found on floor beside bed about 5 p.m. today.  EXAM: CT HEAD WITHOUT CONTRAST  TECHNIQUE: Contiguous axial images were obtained from the base of the skull through the vertex without intravenous contrast.  COMPARISON:  None.  FINDINGS: There is no evidence for acute hemorrhage, hydrocephalus, mass lesion, or abnormal extra-axial fluid collection. No definite CT evidence for acute infarction. Diffuse loss of parenchymal volume is consistent with atrophy. Patchy low attenuation in the deep hemispheric and periventricular white matter is nonspecific, but likely reflects chronic microvascular ischemic demyelination. Old lacunar infarct identified in the left basal ganglia.  The visualized paranasal sinuses and mastoid air cells are clear.  IMPRESSION: No acute intracranial abnormality.  Atrophy with chronic small vessel white matter ischemic disease.   Electronically Signed   By: Misty Stanley M.D.   On: 05/30/2014 21:15    03/07/2014 Echo Study Conclusions  - Left ventricle: The cavity size was normal. Systolic function was vigorous. The estimated ejection fraction was in the range of 65% to 70%. Wall motion was normal; there were no regional wall motion abnormalities. The study was not technically sufficient to allow evaluation of LV diastolic dysfunction due to atrial fibrillation. - Aortic valve: Trileaflet; moderately thickened,  moderately calcified leaflets. Sclerosis without stenosis. There was no regurgitation. - Aortic root: The aortic root was normal in size. - Mitral valve: There was trivial regurgitation. - Left atrium: The atrium was mildly dilated. - Right ventricle: The cavity size was moderately dilated. Wall thickness was normal. Systolic function was moderately reduced. - Right atrium: The atrium was normal in size. - Pulmonic valve: There was no regurgitation. - Pulmonary arteries: Systolic pressure was moderately increased. PA peak pressure: 54 mm Hg (S).  Impressions:  - Normal left ventricular size and function. Moderately dilated right ventricle with moderately impaired systolic function. Moderate pulmonary hypertension.   Assessment/Plan:   Principal Problem:   Acute on chronic diastolic heart failure Active Problems:   Fall   Elevated troponin   UTI (lower urinary tract infection)   Uncontrolled hypertension   UTI (urinary tract infection)  1. Acute on chronic diastolic dysfunction;  BNP 691; Continue IV lasix today 2. Mildly positive troponin; medical therapy, probably contributed by CHF 3. Permanent AF; with fall history not on AC; but just ASA 4. LE edema with probable venous insufficiency 5. Hypo K; replete to > 4.0; check Mg 6. ?UTI    Troy Sine, MD, [facility] 06/01/2014, 8:13 AM

## 2014-06-01 NOTE — Progress Notes (Signed)
Physical Therapy Treatment Patient Details Name: Jean Murphy MRN: [id number] DOB: 14-Mar-1918 Today's Date: 06/01/2014    History of Present Illness 81 yo female admitted with fall. Hx of chronic Afib, CHF, CVA, AAA, HTN, DM. compresion fxs (T10, L2).     PT Comments    Pt in bed on 2 lts O2.  Assisted OOB to amb in hallway using RW.  Pt required <25% VC's on safety with turns.  Decreased gait speed.  No true LOB. Due to pt's fall history and slow gait speed, pt would benefit from [name].  Follow Up Recommendations  Supervision/Assistance - 24 hour;Home health PT;SNF     Equipment Recommendations       Recommendations for Other Services       Precautions / Restrictions Precautions Precautions: Fall Restrictions Weight Bearing Restrictions: No    Mobility  Bed Mobility Overal bed mobility: Needs Assistance       Supine to sit: Supervision     General bed mobility comments: self performed only required increased time  Transfers Overall transfer level: Needs assistance Equipment used: Rolling walker (2 wheeled) Transfers: Sit to/from Stand Sit to Stand: Supervision;Min guard         General transfer comment: self performed only required increased time  Ambulation/Gait Ambulation/Gait assistance: Supervision;Min guard Ambulation Distance (Feet): 85 Feet Assistive device: Rolling walker (2 wheeled) Gait Pattern/deviations: Step-through pattern Gait velocity: decreased   General Gait Details: <25% assist with proper walker to self distance esp with turns.  Remained on 2 lts during session.   Stairs            Wheelchair Mobility    Modified Rankin (Stroke Patients Only)       Balance                                    Cognition Arousal/Alertness: Awake/alert Behavior During Therapy: WFL for tasks assessed/performed                   General Comments: very pleasant    Exercises      General Comments         Pertinent Vitals/Pain Pain Assessment: No/denies pain    Home Living                      Prior Function            PT Goals (current goals can now be found in the care plan section) Progress towards PT goals: Progressing toward goals    Frequency  Min 3X/week    PT Plan      Co-evaluation             End of Session Equipment Utilized During Treatment: Gait belt Activity Tolerance: Patient tolerated treatment well Patient left: in chair;with call bell/phone within reach;with chair alarm set     Time: 1035-1059 PT Time Calculation (min) (ACUTE ONLY): 24 min  Charges:  $Gait Training: 8-22 mins $Therapeutic Activity: 8-22 mins                    G Codes:      Rica Koyanagi  PTA [facility]  Acute  Rehab Pager      [phone]

## 2014-06-01 NOTE — Progress Notes (Signed)
Clinical Social Work Department BRIEF PSYCHOSOCIAL ASSESSMENT 06/01/2014  Patient:  Jean Murphy, Jean Murphy     Account Number:  [id number]     Admit date:  05/30/2014  Clinical Social Worker:  Renold Genta  Date/Time:  06/01/2014 03:24 PM  Referred by:  Physician  Date Referred:  06/01/2014 Referred for  SNF Placement   Other Referral:   Interview type:  Patient Other interview type:   and daughter, Jean Murphy via phone    PSYCHOSOCIAL DATA Living Status:  ALONE Admitted from facility:   Level of care:   Primary support name:  [name] (daughter) h#: [phone] c#: [phone] w#: 66 Primary support relationship to patient:  CHILD, ADULT Degree of support available:   good    CURRENT CONCERNS Current Concerns  Post-Acute Placement   Other Concerns:    SOCIAL WORK ASSESSMENT / PLAN CSW received consult for SNF placement.   Assessment/plan status:  Information/Referral to [employer] Other assessment/ plan:   Information/referral to community resources:   CSW completed FL2 and faxed information out to [facility] - provided bed offers.    PATIENT'S/FAMILY'S RESPONSE TO PLAN OF CARE: CSW explained to patient & daughter discharge options. Patient is admitted from [facility] (senior apartment complex, not ALF). Daughter asked about [facility], though PT did not recommend and per daughter - patient would not be able to tolerate the 3 hours of therapy a day. CSW provided bed offers & will await SNF decision. Daughter also requested ALF list as patient may need to go to ALF from SNF rather than return to her senior apartment once done with SNF/Rehab stay.       [name], [facility] Clinical Social Worker cell #: [phone]

## 2014-06-02 DIAGNOSIS — E876 Hypokalemia: Secondary | ICD-10-CM

## 2014-06-02 LAB — CBC
HEMATOCRIT: 38.8 % (ref 36.0–46.0)
HEMOGLOBIN: 12.3 g/dL (ref 12.0–15.0)
MCH: 31.5 pg (ref 26.0–34.0)
MCHC: 31.7 g/dL (ref 30.0–36.0)
MCV: 99.2 fL (ref 78.0–100.0)
Platelets: 233 10*3/uL (ref 150–400)
RBC: 3.91 MIL/uL (ref 3.87–5.11)
RDW: 15.1 % (ref 11.5–15.5)
WBC: 8.3 10*3/uL (ref 4.0–10.5)

## 2014-06-02 LAB — BASIC METABOLIC PANEL
Anion gap: 5 (ref 5–15)
BUN: 22 mg/dL (ref 6–23)
CALCIUM: 8.3 mg/dL — AB (ref 8.4–10.5)
CO2: 40 mmol/L — AB (ref 19–32)
Chloride: 95 mEq/L — ABNORMAL LOW (ref 96–112)
Creatinine, Ser: 0.72 mg/dL (ref 0.50–1.10)
GFR calc Af Amer: 83 mL/min — ABNORMAL LOW (ref >90)
GFR, EST NON AFRICAN AMERICAN: 71 mL/min — AB (ref >90)
GLUCOSE: 86 mg/dL (ref 70–99)
Potassium: 3.4 mmol/L — ABNORMAL LOW (ref 3.5–5.1)
Sodium: 140 mmol/L (ref 135–145)

## 2014-06-02 LAB — GLUCOSE, CAPILLARY
GLUCOSE-CAPILLARY: 120 mg/dL — AB (ref 70–99)
GLUCOSE-CAPILLARY: 132 mg/dL — AB (ref 70–99)
Glucose-Capillary: 80 mg/dL (ref 70–99)
Glucose-Capillary: 123 mg/dL — ABNORMAL HIGH (ref 70–99)

## 2014-06-02 LAB — MAGNESIUM: Magnesium: 1.7 mg/dL (ref 1.5–2.5)

## 2014-06-02 MED ORDER — POTASSIUM CHLORIDE 20 MEQ/15ML (10%) PO SOLN
40.0000 meq | Freq: Every day | ORAL | Status: DC
  Administered 2014-06-02 – 2014-06-03 (×2): 40 meq via ORAL
  Filled 2014-06-02 (×2): qty 30

## 2014-06-02 MED ORDER — ACETAZOLAMIDE 250 MG PO TABS
250.0000 mg | ORAL_TABLET | Freq: Two times a day (BID) | ORAL | Status: DC
Start: 2014-06-02 — End: 2014-06-03
  Administered 2014-06-02 (×2): 250 mg via ORAL
  Filled 2014-06-02 (×3): qty 1

## 2014-06-02 MED ORDER — MAGNESIUM SULFATE 2 GM/50ML IV SOLN
2.0000 g | Freq: Once | INTRAVENOUS | Status: AC
  Administered 2014-06-02: 2 g via INTRAVENOUS
  Filled 2014-06-02: qty 50

## 2014-06-02 NOTE — Progress Notes (Signed)
CRITICAL VALUE ALERT  Critical value received:  CO2 40  Date of notification:  06/02/2014   Time of notification:  0512  Critical value read back:Yes.    Nurse who received alert:  Sabino Gasser, RN  MD notified (1st page):  Tylene Fantasia, NP  Time of first page:  [phone]  MD notified (2nd page):   Time of second page:  Responding MD:  NP on call notified via text page  Time MD responded:  No return call from NP

## 2014-06-02 NOTE — Progress Notes (Signed)
Subjective:  No chest pain; breathing better  Objective:   Vital Signs in the last 24 hours: Temp:  [98.1 F (36.7 C)-98.2 F (36.8 C)] 98.1 F (36.7 C) (01/05 0502) Pulse Rate:  [63-80] 63 (01/05 0502) Resp:  [16-18] 16 (01/05 0502) BP: (138-155)/(70-90) 149/81 mmHg (01/05 0502) SpO2:  [97 %-98 %] 98 % (01/05 0502) Weight:  [134 lb 0.6 oz (60.8 kg)] 134 lb 0.6 oz (60.8 kg) (01/05 0502)  Intake/Output from previous day: 01/04 0701 - 01/05 0700 In: 773 [P.O.:720; I.V.:3; IV Piggyback:50] Out: 4268 [Urine:1850] Net:  -3419  I/O since admission:-1421   Medications: . aspirin EC  325 mg Oral Daily  . calcitonin (salmon)  1 spray Alternating Nares Daily  . cefTRIAXone (ROCEPHIN)  IV  1 g Intravenous Q24H  . Difluprednate  1 drop Both Eyes BID  . digoxin  0.125 mg Oral Daily  . docusate sodium  300 mg Oral QHS  . enoxaparin (LOVENOX) injection  40 mg Subcutaneous Q24H  . fluticasone  1 spray Each Nare Daily  . furosemide  40 mg Intravenous BID  . hydrALAZINE  50 mg Oral 3 times per day  . insulin aspart  0-9 Units Subcutaneous TID WC  . magnesium sulfate 1 - 4 g bolus IVPB  2 g Intravenous Once  . metoprolol  50 mg Oral BID  . multivitamin with minerals  1 tablet Oral Daily  . PARoxetine  20 mg Oral Daily  . potassium chloride  40 mEq Oral Daily  . sodium chloride  3 mL Intravenous Q12H  . sodium chloride  3 mL Intravenous Q12H       Physical Exam:   General appearance: alert, cooperative and no distress Neck:  JVD increased to ~8 cm; no adenopathy, no carotid bruit, supple, symmetrical, trachea midline and thyroid not enlarged, symmetric, no tenderness/mass/nodules Lungs: decreased BS at bases Heart: irregularly irregular rhythm 1/6 sem Abdomen: soft, non-tender; bowel sounds normal; no masses,  no organomegaly Extremities: 1+ LE pitting edema Neurologic: Grossly normal   Rate: 60's  Rhythm: atrial fibrillation  ECG (independently read by me): Wandering  baseline  With probable AF 88  Lab Results:  BMP Latest Ref Rng 06/02/2014 06/01/2014 05/31/2014  Glucose 70 - 99 mg/dL 86 86 127(H)  BUN 6 - 23 mg/dL 22 24(H) 23  Creatinine 0.50 - 1.10 mg/dL 0.72 0.67 0.84  Sodium 135 - 145 mmol/L 140 141 142  Potassium 3.5 - 5.1 mmol/L 3.4(L) 3.3(L) 3.3(L)  Chloride 96 - 112 mEq/L 95(L) 96 95(L)  CO2 19 - 32 mmol/L 40(HH) 38(H) 39(H)  Calcium 8.4 - 10.5 mg/dL 8.3(L) 8.0(L) 8.4     CBC Latest Ref Rng 06/02/2014 06/01/2014 05/31/2014  WBC 4.0 - 10.5 K/uL 8.3 8.4 15.2(H)  Hemoglobin 12.0 - 15.0 g/dL 12.3 12.3 14.7  Hematocrit 36.0 - 46.0 % 38.8 38.3 46.1(H)  Platelets 150 - 400 K/uL 233 233 283      Recent Labs  05/31/14 0547 05/31/14 1230  TROPONINI 0.21* 0.22*    Hepatic Function Panel  Recent Labs  05/31/14 0050  PROT 5.9*  ALBUMIN 2.9*  AST 29  ALT 13  ALKPHOS 57  BILITOT 1.2   No results for input(s): INR in the last 72 hours. BNP (last 3 results)   BNP (not Pro-BNP) 691 on 05/30/14  Lipid Panel  No results found for: CHOL, TRIG, HDL, CHOLHDL, VLDL, LDLCALC, LDLDIRECT    Imaging:  No results found.  03/07/2014 Echo Study Conclusions  -  Left ventricle: The cavity size was normal. Systolic function was vigorous. The estimated ejection fraction was in the range of 65% to 70%. Wall motion was normal; there were no regional wall motion abnormalities. The study was not technically sufficient to allow evaluation of LV diastolic dysfunction due to atrial fibrillation. - Aortic valve: Trileaflet; moderately thickened, moderately calcified leaflets. Sclerosis without stenosis. There was no regurgitation. - Aortic root: The aortic root was normal in size. - Mitral valve: There was trivial regurgitation. - Left atrium: The atrium was mildly dilated. - Right ventricle: The cavity size was moderately dilated. Wall thickness was normal. Systolic function was moderately reduced. - Right atrium: The atrium was normal in  size. - Pulmonic valve: There was no regurgitation. - Pulmonary arteries: Systolic pressure was moderately increased. PA peak pressure: 54 mm Hg (S).  Impressions:  - Normal left ventricular size and function. Moderately dilated right ventricle with moderately impaired systolic function. Moderate pulmonary hypertension.   Assessment/Plan:   Principal Problem:   Acute on chronic diastolic heart failure Active Problems:   Fall   Elevated troponin   UTI (lower urinary tract infection)   Uncontrolled hypertension   UTI (urinary tract infection)  1. Acute on chronic diastolic dysfunction; BNP 691; Continue IV lasix again today 2. Mildly positive troponin; medical therapy, probably contributed by CHF 3. Permanent AF; with fall history not on AC; but just ASA 4. LE edema improving but still 1+ contributed by  probable venous insufficiency 5. Hypo K; today still low at 3.4; replete to > 4.0;  Mg 1.7  6. Moderate pulmonary HTN with PA pp est at 54 mmHg 7. UTI  Will give short term  diamox 250 mg with probable contraction alkalosis and CO2 40  Troy Sine, MD, [facility] 06/02/2014, 8:11 AM

## 2014-06-02 NOTE — Progress Notes (Signed)
[facility] PROGRESS NOTE  Jean Murphy TGG:[id number] DOB: 11/08/1917 DOA: 05/30/2014 PCP: Antony Blackbird, MD  Assessment/Plan: #1 acute on chronic diastolic heart failure Patient is presented with a fall and shortness of breath. Physical exam with crackles noted on pulmonary exam on hospital day 1. Clinical improvement. Pro BNP is elevated on admission. Chest x-ray consistent with a mild CHF on admission. Cardiac enzymes slightly elevated. Patient with recent 2-D echo on 02/26/2014 with a EF of 65-70% with no wall motion abnormalities. Moderately thickened aortic valve calcified leaflets. Moderately dilated right ventricle with moderately impaired systolic function. Moderate pulmonary hypertension. Strict I's and O's. Daily weights. I/O = -1601. Continue IV Lasix. Cardiology following and appreciate input and recommendations.  #2 urinary tract infection Urine cultures pending. Continue empiric IV Rocephin D3.  #3 Fall PT/OT.  #4 elevated troponin. Patient denies any chest pain. Aspirin. Patient with recent 2-D echo and per cardiology no need to repeat echo at this time. Patient denies any chest pain. It is elevated troponin may have been secondary to right ventricle a strain. Cardiology following and appreciate input and recommendations.  #5 atrial fibrillation Patient with chronic falls, and a such not an anticoagulation candidate. Continue Lopressor and digoxin for rate control.  #6 hypertension Stable. Continue Lopressor, lasix and digoxin.  #7 diabetes mellitus  Hgb A1c = 5.9. SSI  #8 leukocytosis Likely secondary to urinary tract infection. Improved. Urine cultures pending. Continue IV Rocephin.  #9 history of abdominal aortic aneurysm Stable. Asymptomatic. Outpatient follow-up.  #10 prophylaxis Lovenox for DVT prophylaxis.   Code Status: Full Family Communication: Updated patient no family at bedside.Updated daughter last night. Disposition Plan: SNF on  discharge.   Consultants:  Cardiology: Dr. Acie Fredrickson 05/31/2014  Procedures:  CT head without contrast 05/30/2014  Chest x-ray 05/30/2014  X-ray of the L-spine 05/30/2014  Antibiotics:  IV Rocephin 05/31/2013  HPI/Subjective: Patient denies any chest pain. Patient states SOB improved. No complaints.  Objective: Filed Vitals:   06/02/14 0502  BP: 149/81  Pulse: 63  Temp: 98.1 F (36.7 C)  Resp: 16    Intake/Output Summary (Last 24 hours) at 06/02/14 1055 Last data filed at 06/02/14 0757  Gross per 24 hour  Intake    833 ml  Output   1850 ml  Net  -1017 ml   Filed Weights   05/31/14 0012 06/01/14 0438 06/02/14 0502  Weight: 62.5 kg (137 lb 12.6 oz) 61.9 kg (136 lb 7.4 oz) 60.8 kg (134 lb 0.6 oz)    Exam:   General:  NAD  Cardiovascular: RRR  Respiratory: Decreased BS in bases. No rhonchi.  Abdomen: Soft, nontender, nondistended, positive bowel sounds.  Musculoskeletal: No clubbing or cyanosis. Trace bilateral lower extremity edema.  Data Reviewed: Basic Metabolic Panel:  Recent Labs Lab 05/30/14 2057 05/31/14 0050 06/01/14 0439 06/02/14 0414  NA 142 142 141 140  K 3.7 3.3* 3.3* 3.4*  CL 95* 95* 96 95*  CO2  --  39* 38* 40*  GLUCOSE 138* 127* 86 86  BUN 26* 23 24* 22  CREATININE 0.90 0.84 0.67 0.72  CALCIUM  --  8.4 8.0* 8.3*  MG  --   --  1.5 1.7   Liver Function Tests:  Recent Labs Lab 05/31/14 0050  AST 29  ALT 13  ALKPHOS 57  BILITOT 1.2  PROT 5.9*  ALBUMIN 2.9*   No results for input(s): LIPASE, AMYLASE in the last 168 hours. No results for input(s): AMMONIA in the last 168 hours. CBC:  Recent Labs Lab 05/30/14 2040 05/30/14 2057 05/31/14 0050 06/01/14 0439 06/02/14 0414  WBC 12.4*  --  15.2* 8.4 8.3  NEUTROABS 10.8*  --  13.6*  --   --   HGB 14.9 17.3* 14.7 12.3 12.3  HCT 47.3* 51.0* 46.1* 38.3 38.8  MCV 98.1  --  98.5 99.7 99.2  PLT 259  --  283 233 233   Cardiac Enzymes:  Recent Labs Lab 05/30/14 2040  05/30/14 2106 05/31/14 0050 05/31/14 0547 05/31/14 1230  CKTOTAL 147  --   --   --   --   TROPONINI  --  0.21* 0.26* 0.21* 0.22*   BNP (last 3 results) No results for input(s): PROBNP in the last 8760 hours. CBG:  Recent Labs Lab 06/01/14 0733 06/01/14 1147 06/01/14 1653 06/01/14 2217 06/02/14 0722  GLUCAP 77 99 95 114* 120*    Recent Results (from the past 240 hour(s))  MRSA PCR Screening     Status: None   Collection Time: 05/31/14  3:30 AM  Result Value Ref Range Status   MRSA by PCR NEGATIVE NEGATIVE Final    Comment:        The GeneXpert MRSA Assay (FDA approved for NASAL specimens only), is one component of a comprehensive MRSA colonization surveillance program. It is not intended to diagnose MRSA infection nor to guide or monitor treatment for MRSA infections.   Culture, blood (routine x 2)     Status: None (Preliminary result)   Collection Time: 05/31/14  9:43 AM  Result Value Ref Range Status   Specimen Description BLOOD RIGHT ARM  Final   Special Requests   Final    BOTTLES DRAWN AEROBIC AND ANAEROBIC 10CC BOTH BOTTLES   Culture   Final           BLOOD CULTURE RECEIVED NO GROWTH TO DATE CULTURE WILL BE HELD FOR 5 DAYS BEFORE ISSUING A FINAL NEGATIVE REPORT Performed at [facility]    Report Status PENDING  Incomplete  Culture, blood (routine x 2)     Status: None (Preliminary result)   Collection Time: 05/31/14 12:30 PM  Result Value Ref Range Status   Specimen Description BLOOD RIGHT ARM  Final   Special Requests   Final    BOTTLES DRAWN AEROBIC AND ANAEROBIC 10CC BOTH BOTTLES   Culture   Final           BLOOD CULTURE RECEIVED NO GROWTH TO DATE CULTURE WILL BE HELD FOR 5 DAYS BEFORE ISSUING A FINAL NEGATIVE REPORT Performed at [facility]    Report Status PENDING  Incomplete     Studies: No results found.  Scheduled Meds: . acetaZOLAMIDE  250 mg Oral BID  . aspirin EC  325 mg Oral Daily  . calcitonin (salmon)  1 spray  Alternating Nares Daily  . cefTRIAXone (ROCEPHIN)  IV  1 g Intravenous Q24H  . Difluprednate  1 drop Both Eyes BID  . digoxin  0.125 mg Oral Daily  . docusate sodium  300 mg Oral QHS  . enoxaparin (LOVENOX) injection  40 mg Subcutaneous Q24H  . fluticasone  1 spray Each Nare Daily  . furosemide  40 mg Intravenous BID  . hydrALAZINE  50 mg Oral 3 times per day  . insulin aspart  0-9 Units Subcutaneous TID WC  . metoprolol  50 mg Oral BID  . multivitamin with minerals  1 tablet Oral Daily  . PARoxetine  20 mg Oral Daily  . potassium chloride  40 mEq Oral Daily  . sodium chloride  3 mL Intravenous Q12H  . sodium chloride  3 mL Intravenous Q12H   Continuous Infusions:   Principal Problem:   Acute on chronic diastolic heart failure Active Problems:   Fall   Elevated troponin   UTI (lower urinary tract infection)   Uncontrolled hypertension   UTI (urinary tract infection)    Time spent: 63 minutes    Doree Kuehne M.D. [facility] Pager [phone] If 7PM-7AM, please contact night-coverage at [email or web address], password [facility] 06/02/2014, 10:55 AM  LOS: 3 days

## 2014-06-02 NOTE — Progress Notes (Signed)
Occupational Therapy Treatment Patient Details Name: Jean Murphy MRN: [id number] DOB: 1918-04-03 Today's Date: 06/02/2014    History of present illness 81 yo female admitted with fall. Hx of chronic Afib, CHF, CVA, AAA, HTN, DM. compresion fxs (T10, L2).    OT comments  Pt participated well in OT.  Cues to sustain attention to task and extra time to initiate.  Follow Up Recommendations  SNF    Equipment Recommendations  3 in 1 bedside comode    Recommendations for Other Services      Precautions / Restrictions Precautions Precautions: Fall Restrictions Weight Bearing Restrictions: No       Mobility Bed Mobility         Supine to sit: Supervision     General bed mobility comments: increased time, HOB raised  Transfers   Equipment used: Rolling walker (2 wheeled) Transfers: Sit to/from Stand Sit to Stand: Min guard         General transfer comment: increased time, min guard for safety    Balance                                   ADL       Grooming: Wash/dry hands;Wash/dry face;Oral care;Brushing hair;Set up;Minimal assistance;Sitting (set up for all tasks except for brushing hair--min A)                   Toilet Transfer: Stand-pivot;Min guard (to chair)             General ADL Comments: assisted pt out of bed to recliner.  Worked on sustained attention during grooming tasks:  Mod cueing to sustain attention.    Pt had already completed bathing.  Pt thought she had been up in chair earlier, but she had not.        Vision                     Perception     Praxis      Cognition   Behavior During Therapy: WFL for tasks assessed/performed Overall Cognitive Status: No family/caregiver present to determine baseline cognitive functioning   Orientation Level: Disoriented to;Time;Situation Current Attention Level: Sustained Memory: Decreased short-term memory        Problem Solving: Requires verbal cues (extra  time) General Comments: pt had difficulty expressing self and got lost in her thoughts:  aware that this is happening and states it is happening more since she's been here.  Cues for task completion    Extremity/Trunk Assessment               Exercises     Shoulder Instructions       General Comments      Pertinent Vitals/ Pain       Pain Assessment: No/denies pain  Home Living                                          Prior Functioning/Environment              Frequency Min 2X/week     Progress Toward Goals  OT Goals(current goals can now be found in the care plan section)  Progress towards OT goals: Progressing toward goals (goals set today)  Acute Rehab OT Goals Time For Goal Achievement: 06/16/14 Potential to Achieve Goals:  Fair ADL Goals Pt Will Perform Grooming: with supervision;sitting (4 tasks) Pt Will Transfer to Toilet: with min guard assist;ambulating;bedside commode Pt Will Perform Toileting - Clothing Manipulation and hygiene: with min guard assist;sit to/from stand Additional ADL Goal #1: pt will sustain attention to task for 2 minutes without redirection  Plan      Co-evaluation                 End of Session     Activity Tolerance Patient tolerated treatment well   Patient Left in chair;with call bell/phone within reach;with chair alarm set   Nurse Communication          Time: 5520-8022 OT Time Calculation (min): 24 min  Charges: OT General Charges $OT Visit: 1 Procedure OT Treatments $Self Care/Home Management : 8-22 mins $Cognitive Skills Development: 8-22 mins  Jean Murphy 06/02/2014, 1:21 PM Lesle Chris, OTR/L [phone] 06/02/2014

## 2014-06-03 ENCOUNTER — Inpatient Hospital Stay (HOSPITAL_COMMUNITY): Payer: Commercial Managed Care - HMO

## 2014-06-03 LAB — URINE CULTURE
COLONY COUNT: NO GROWTH
CULTURE: NO GROWTH

## 2014-06-03 LAB — BASIC METABOLIC PANEL
Anion gap: 10 (ref 5–15)
BUN: 22 mg/dL (ref 6–23)
CO2: 30 mmol/L (ref 19–32)
Calcium: 8.4 mg/dL (ref 8.4–10.5)
Chloride: 93 mEq/L — ABNORMAL LOW (ref 96–112)
Creatinine, Ser: 0.76 mg/dL (ref 0.50–1.10)
GFR calc non Af Amer: 70 mL/min — ABNORMAL LOW (ref >90)
GFR, EST AFRICAN AMERICAN: 81 mL/min — AB (ref >90)
Glucose, Bld: 85 mg/dL (ref 70–99)
Potassium: 4 mmol/L (ref 3.5–5.1)
Sodium: 133 mmol/L — ABNORMAL LOW (ref 135–145)

## 2014-06-03 LAB — CBC
HCT: 44 % (ref 36.0–46.0)
HEMOGLOBIN: 13.9 g/dL (ref 12.0–15.0)
MCH: 31.5 pg (ref 26.0–34.0)
MCHC: 31.6 g/dL (ref 30.0–36.0)
MCV: 99.8 fL (ref 78.0–100.0)
Platelets: 238 10*3/uL (ref 150–400)
RBC: 4.41 MIL/uL (ref 3.87–5.11)
RDW: 14.9 % (ref 11.5–15.5)
WBC: 19 10*3/uL — ABNORMAL HIGH (ref 4.0–10.5)

## 2014-06-03 LAB — BRAIN NATRIURETIC PEPTIDE: B Natriuretic Peptide: 681.1 pg/mL — ABNORMAL HIGH (ref 0.0–100.0)

## 2014-06-03 LAB — MAGNESIUM: Magnesium: 1.9 mg/dL (ref 1.5–2.5)

## 2014-06-03 LAB — GLUCOSE, CAPILLARY
Glucose-Capillary: 119 mg/dL — ABNORMAL HIGH (ref 70–99)
Glucose-Capillary: 112 mg/dL — ABNORMAL HIGH (ref 70–99)
Glucose-Capillary: 159 mg/dL — ABNORMAL HIGH (ref 70–99)
Glucose-Capillary: 116 mg/dL — ABNORMAL HIGH (ref 70–99)

## 2014-06-03 MED ORDER — FUROSEMIDE 40 MG PO TABS
40.0000 mg | ORAL_TABLET | Freq: Two times a day (BID) | ORAL | Status: DC
  Administered 2014-06-03 – 2014-06-04 (×4): 40 mg via ORAL
  Filled 2014-06-03 (×4): qty 1

## 2014-06-03 MED ORDER — CLINDAMYCIN PHOSPHATE 300 MG/50ML IV SOLN
300.0000 mg | Freq: Three times a day (TID) | INTRAVENOUS | Status: DC
  Administered 2014-06-03 – 2014-06-04 (×4): 300 mg via INTRAVENOUS
  Filled 2014-06-03 (×5): qty 50

## 2014-06-03 NOTE — Care Management Note (Addendum)
    Page 1 of 1   06/09/2014     2:46:43 PM CARE MANAGEMENT NOTE 06/09/2014  Patient:  Jean Murphy, Jean Murphy   Account Number:  [id number]  Date Initiated:  06/03/2014  Documentation initiated by:  Dessa Phi  Subjective/Objective Assessment:   81 y/o f admitted w/CHF.     Action/Plan:   From [employer].   Anticipated DC Date:  06/09/2014   Anticipated DC Plan:  [place]  CM consult      Choice offered to / List presented to:             Status of service:  Completed, signed off Medicare Important Message given?  YES (If response is "NO", the following Medicare IM given date fields will be blank) Date Medicare IM given:  06/08/2014 Medicare IM given by:  [facility] Date Additional Medicare IM given:   Additional Medicare IM given by:    Discharge Disposition:  [place]  Per UR Regulation:  Reviewed for med. necessity/level of care/duration of stay  If discussed at [place] of Stay Meetings, dates discussed:   06/09/2014    Comments:  06/09/14 Dessa Phi RN BSN NCM [phone] d/c snf.  06/08/14 Dessa Phi RN, BSN NCM [phone] d/c plan snf.  06/03/14 Dessa Phi RN BSN NCM [phone] w-19,iv abx,cx pend,02 sats 88%. PT-SNF,d/c plan snf.

## 2014-06-03 NOTE — Progress Notes (Addendum)
Subjective:  No chest pain; breathing better  Objective:   Vital Signs in the last 24 hours: Temp:  [97.5 F (36.4 C)-97.9 F (36.6 C)] 97.9 F (36.6 C) (01/06 0627) Pulse Rate:  [60-75] 75 (01/06 0627) Resp:  [16-20] 18 (01/06 0627) BP: (119-155)/(58-64) 155/58 mmHg (01/06 0627) SpO2:  [93 %-98 %] 97 % (01/06 0627) Weight:  [134 lb 14.7 oz (61.2 kg)] 134 lb 14.7 oz (61.2 kg) (01/06 0627)  Intake/Output from previous day: 01/05 0701 - 01/06 0700 In: 53 [P.O.:540; IV Piggyback:50] Out: 6295 [Urine:3100; Stool:1] Net:  -2841  I/O since admission: -4112   Medications: . acetaZOLAMIDE  250 mg Oral BID  . aspirin EC  325 mg Oral Daily  . calcitonin (salmon)  1 spray Alternating Nares Daily  . cefTRIAXone (ROCEPHIN)  IV  1 g Intravenous Q24H  . Difluprednate  1 drop Both Eyes BID  . digoxin  0.125 mg Oral Daily  . docusate sodium  300 mg Oral QHS  . enoxaparin (LOVENOX) injection  40 mg Subcutaneous Q24H  . fluticasone  1 spray Each Nare Daily  . furosemide  40 mg Intravenous BID  . hydrALAZINE  50 mg Oral 3 times per day  . insulin aspart  0-9 Units Subcutaneous TID WC  . metoprolol  50 mg Oral BID  . multivitamin with minerals  1 tablet Oral Daily  . PARoxetine  20 mg Oral Daily  . potassium chloride  40 mEq Oral Daily  . sodium chloride  3 mL Intravenous Q12H  . sodium chloride  3 mL Intravenous Q12H       Physical Exam:   General appearance: alert, cooperative and no distress Neck:  JVD increased to ~8 cm; no adenopathy, no carotid bruit, supple, symmetrical, trachea midline and thyroid not enlarged, symmetric, no tenderness/mass/nodules Lungs: decreased BS at bases Heart: irregularly irregular rhythm 1/6 sem Abdomen: soft, non-tender; bowel sounds normal; no masses,  no organomegaly Extremities: improved edema, now trace Neurologic: Grossly normal   Rate: 60's  Rhythm: atrial fibrillation  ECG (independently read by me): Wandering baseline  With  probable AF 88  Lab Results:  BMP Latest Ref Rng 06/03/2014 06/02/2014 06/01/2014  Glucose 70 - 99 mg/dL 85 86 86  BUN 6 - 23 mg/dL 22 22 24(H)  Creatinine 0.50 - 1.10 mg/dL 0.76 0.72 0.67  Sodium 135 - 145 mmol/L 133(L) 140 141  Potassium 3.5 - 5.1 mmol/L 4.0 3.4(L) 3.3(L)  Chloride 96 - 112 mEq/L 93(L) 95(L) 96  CO2 19 - 32 mmol/L 30 40(HH) 38(H)  Calcium 8.4 - 10.5 mg/dL 8.4 8.3(L) 8.0(L)     CBC Latest Ref Rng 06/03/2014 06/02/2014 06/01/2014  WBC 4.0 - 10.5 K/uL 19.0(H) 8.3 8.4  Hemoglobin 12.0 - 15.0 g/dL 13.9 12.3 12.3  Hematocrit 36.0 - 46.0 % 44.0 38.8 38.3  Platelets 150 - 400 K/uL 238 233 233      Recent Labs  05/31/14 1230  TROPONINI 0.22*    Hepatic Function Panel No results for input(s): PROT, ALBUMIN, AST, ALT, ALKPHOS, BILITOT, BILIDIR, IBILI in the last 72 hours. No results for input(s): INR in the last 72 hours. BNP (last 3 results)   BNP (not Pro-BNP) 691 on 05/30/14 =>681 today  Lipid Panel  No results found for: CHOL, TRIG, HDL, CHOLHDL, VLDL, LDLCALC, LDLDIRECT    Imaging:  No results found.  03/07/2014 Echo Study Conclusions  - Left ventricle: The cavity size was normal. Systolic function was vigorous. The estimated ejection fraction was  in the range of 65% to 70%. Wall motion was normal; there were no regional wall motion abnormalities. The study was not technically sufficient to allow evaluation of LV diastolic dysfunction due to atrial fibrillation. - Aortic valve: Trileaflet; moderately thickened, moderately calcified leaflets. Sclerosis without stenosis. There was no regurgitation. - Aortic root: The aortic root was normal in size. - Mitral valve: There was trivial regurgitation. - Left atrium: The atrium was mildly dilated. - Right ventricle: The cavity size was moderately dilated. Wall thickness was normal. Systolic function was moderately reduced. - Right atrium: The atrium was normal in size. - Pulmonic valve: There was  no regurgitation. - Pulmonary arteries: Systolic pressure was moderately increased. PA peak pressure: 54 mm Hg (S).  Impressions:  - Normal left ventricular size and function. Moderately dilated right ventricle with moderately impaired systolic function. Moderate pulmonary hypertension.   Assessment/Plan:   Principal Problem:   Acute on chronic diastolic heart failure Active Problems:   Fall   Elevated troponin   UTI (lower urinary tract infection)   Uncontrolled hypertension   UTI (urinary tract infection)  1. Acute on chronic diastolic dysfunction; BNP 681; Excellent diuresis yesterday; Would transition to oral lasix today at 40 mg po bid.  2. Mildly positive troponin; medical therapy, probably contributed by CHF 3. Permanent AF; with fall history not on AC; but just ASA 4. LE edema improving but still 1+ contributed by  probable venous insufficiency 5. Hypo K: today now 4.0 6. Moderate pulmonary HTN with PA pp est at 54 mmHg 7. UTI  Good response with 2 doses of diamox with CO2 40 =>30; K now normal. Will dc diamox.  Troy Sine, MD, [facility] 06/03/2014, 8:51 AM

## 2014-06-03 NOTE — Progress Notes (Signed)
Physical Therapy Treatment Patient Details Name: Jean Murphy MRN: [id number] DOB: 04-02-1918 Today's Date: 06/03/2014    History of Present Illness 81 yo female admitted with fall. Hx of chronic Afib, CHF, CVA, AAA, HTN, DM. compresion fxs (T10, L2).     PT Comments    Pt required increased assist with all mobility.  Monday pt walked down the hallway.  Today pt was only able to amb to amd from BR and with much effort.  On arrival found pt to be incont loose stools.  Assisted with hygiene with NT.  Assisted pt OOB to amb to BR.  Very unsteady groggy gait.  Mod c/o dizziness.  BP 100/47 standing.  Logged in EPIC and reported to Therapist, sports.  Pt positioned in recliner.  Follow Up Recommendations  SNF     Equipment Recommendations  None recommended by PT    Recommendations for Other Services       Precautions / Restrictions Precautions Precautions: Fall Restrictions Weight Bearing Restrictions: No    Mobility  Bed Mobility Overal bed mobility: Needs Assistance Bed Mobility: Supine to Sit     Supine to sit: Mod assist;Max assist     General bed mobility comments: required increased assist and time today.  Rolling sisde to side for hygiene (loose stools)  Transfers Overall transfer level: Needs assistance Equipment used: Rolling walker (2 wheeled) Transfers: Sit to/from Stand Sit to Stand: Mod assist;Max assist         General transfer comment: required increased asssit this session.  50% VC's on proper hand placement and turn completion.    Ambulation/Gait Ambulation/Gait assistance: Mod assist;Max assist Ambulation Distance (Feet): 20 Feet (10 feet x 2 amb to and from BR) Assistive device: Rolling walker (2 wheeled) Gait Pattern/deviations: Step-to pattern;Step-through pattern;Drifts right/left;Trunk flexed;Decreased step length - right;Decreased step length - left Gait velocity: decreased   General Gait Details: required increase assist this session.  Very slow/groggy  unsteady gait.  Also required increased assist to advance walker correclty.  Pt Max c/o weakness/fatigue and Mod c/o dizziness with amb.     Stairs            Wheelchair Mobility    Modified Rankin (Stroke Patients Only)       Balance                                    Cognition                            Exercises      General Comments        Pertinent Vitals/Pain Pain Assessment: No/denies pain    Home Living                      Prior Function            PT Goals (current goals can now be found in the care plan section) Progress towards PT goals: Progressing toward goals    Frequency  Min 3X/week    PT Plan      Co-evaluation             End of Session Equipment Utilized During Treatment: Gait belt Activity Tolerance: Patient tolerated treatment well Patient left: in chair;with call bell/phone within reach;with chair alarm set     Time: 0950-1015 PT Time Calculation (min) (ACUTE ONLY): 25 min  Charges:  $Gait Training: 8-22 mins $Therapeutic Activity: 8-22 mins                    G Codes:      Rica Koyanagi  PTA [facility]  Acute  Rehab Pager      [phone]

## 2014-06-03 NOTE — Progress Notes (Signed)
[facility] PROGRESS NOTE  Jean Murphy HQI:[id number] DOB: Oct 09, 1917 DOA: 05/30/2014 PCP: Antony Blackbird, MD  81 y/o ? known Diastolic CHF on home O2, Chronic Afib not on AC 2/2 to falls/risk catastrophic bleed, AAA, Htn, Prior CVA [undocumented] who was admitted 05/30/14 with fall without # but ? Pyelo and decompnesated chf and Htn urgency.  Cardiology consulted    Assessment/Plan: #1 acute on chronic diastolic heart failure Patient is presented with a fall and shortness of breath. Physical exam with crackles noted on pulmonary exam on hospital day 1. Clinical improvement. Pro BNP is elevated on admission. Chest x-ray consistent with a mild CHF on admission. Cardiac enzymes slightly elevated. Patient with recent 2-D echo on 02/26/2014 with a EF of 65-70% with no wall motion abnormalities. Moderately thickened aortic valve calcified leaflets. Moderately dilated right ventricle with moderately impaired systolic function. Moderate pulmonary hypertension.  Strict I's and O's. Daily weights. I/O = -3.65 L. IV Lasix  to PO 40 bid 06/03/14. Cardiology following and appreciate input and recommendations.  #2 urinary tract infection Urine cultures pending. Continue empiric IV Rocephin D3.  #3 Fall PT/OT.  #4 elevated troponin. Patient denies any chest pain. Aspirin. Patient with recent 2-D echo and per cardiology no need to repeat echo at this time. Patient denies any chest pain. It is elevated troponin may have been secondary to right ventricle a strain. Cardiology following and appreciate input and recommendations.  #5 atrial fibrillation Patient with chronic falls, and a such not an anticoagulation candidate. Continue Lopressor 50 bid and digoxin 0.125 for rate control.  #6 hypertension Stable. Continue Lopressor, lasix and digoxin.  #7 diabetes mellitus  Hgb A1c = 5.9. Discontinue SSI [she is 89-risks of hypoglycemia outweigh benefit of tight glycemic control]  #8 leukocytosis-possible  aspiration PNA Likely secondary to urinary tract infection. Improved. Urine cultures NG  Since 1/4 Nursing states she coughs while eating I am concerned about aspiration and will change IV Rocephin->IV unasyn  #9 history of abdominal aortic aneurysm Stable. Asymptomatic. Outpatient follow-up.  #10 prophylaxis Lovenox for DVT prophylaxis.   Code Status: Full Family Communication: no family + Disposition Plan: SNF on discharge.   Consultants:  Cardiology: Dr. Acie Fredrickson 05/31/2014  Procedures:  CT head without contrast 05/30/2014  Chest x-ray 05/30/2014  X-ray of the L-spine 05/30/2014  Antibiotics:  IV Rocephin 05/31/2013  HPI/Subjective:  Pleasantly confused [place] president Nursing reports no issues ROS unreliable   Objective: Filed Vitals:   06/03/14 1415  BP: 128/56  Pulse: 75  Temp: 97.8 F (36.6 C)  Resp: 18    Intake/Output Summary (Last 24 hours) at 06/03/14 1506 Last data filed at 06/03/14 1300  Gross per 24 hour  Intake    470 ml  Output   2075 ml  Net  -1605 ml   Filed Weights   06/01/14 0438 06/02/14 0502 06/03/14 0627  Weight: 61.9 kg (136 lb 7.4 oz) 60.8 kg (134 lb 0.6 oz) 61.2 kg (134 lb 14.7 oz)    Exam:   General:  NAD  Cardiovascular: RRR  Respiratory: Decreased BS in bases. Crackles L lower chest  Abdomen: Soft, nontender, nondistended, positive bowel sounds.  Musculoskeletal: No clubbing or cyanosis. Trace bilateral lower extremity edema.  Data Reviewed: Basic Metabolic Panel:  Recent Labs Lab 05/30/14 2057 05/31/14 0050 06/01/14 0439 06/02/14 0414 06/03/14 0419  NA 142 142 141 140 133*  K 3.7 3.3* 3.3* 3.4* 4.0  CL 95* 95* 96 95* 93*  CO2  --  39*  38* 40* 30  GLUCOSE 138* 127* 86 86 85  BUN 26* 23 24* 22 22  CREATININE 0.90 0.84 0.67 0.72 0.76  CALCIUM  --  8.4 8.0* 8.3* 8.4  MG  --   --  1.5 1.7 1.9   Liver Function Tests:  Recent Labs Lab 05/31/14 0050  AST 29  ALT 13  ALKPHOS 57    BILITOT 1.2  PROT 5.9*  ALBUMIN 2.9*   No results for input(s): LIPASE, AMYLASE in the last 168 hours. No results for input(s): AMMONIA in the last 168 hours. CBC:  Recent Labs Lab 05/30/14 2040 05/30/14 2057 05/31/14 0050 06/01/14 0439 06/02/14 0414 06/03/14 0630  WBC 12.4*  --  15.2* 8.4 8.3 19.0*  NEUTROABS 10.8*  --  13.6*  --   --   --   HGB 14.9 17.3* 14.7 12.3 12.3 13.9  HCT 47.3* 51.0* 46.1* 38.3 38.8 44.0  MCV 98.1  --  98.5 99.7 99.2 99.8  PLT 259  --  283 233 233 238   Cardiac Enzymes:  Recent Labs Lab 05/30/14 2040 05/30/14 2106 05/31/14 0050 05/31/14 0547 05/31/14 1230  CKTOTAL 147  --   --   --   --   TROPONINI  --  0.21* 0.26* 0.21* 0.22*   BNP (last 3 results) No results for input(s): PROBNP in the last 8760 hours. CBG:  Recent Labs Lab 06/02/14 1123 06/02/14 1551 06/02/14 2045 06/03/14 0805 06/03/14 1130  GLUCAP 80 123* 132* 119* 112*    Recent Results (from the past 240 hour(s))  MRSA PCR Screening     Status: None   Collection Time: 05/31/14  3:30 AM  Result Value Ref Range Status   MRSA by PCR NEGATIVE NEGATIVE Final    Comment:        The GeneXpert MRSA Assay (FDA approved for NASAL specimens only), is one component of a comprehensive MRSA colonization surveillance program. It is not intended to diagnose MRSA infection nor to guide or monitor treatment for MRSA infections.   Culture, blood (routine x 2)     Status: None (Preliminary result)   Collection Time: 05/31/14  9:43 AM  Result Value Ref Range Status   Specimen Description BLOOD RIGHT ARM  Final   Special Requests   Final    BOTTLES DRAWN AEROBIC AND ANAEROBIC 10CC BOTH BOTTLES   Culture   Final           BLOOD CULTURE RECEIVED NO GROWTH TO DATE CULTURE WILL BE HELD FOR 5 DAYS BEFORE ISSUING A FINAL NEGATIVE REPORT Performed at [facility]    Report Status PENDING  Incomplete  Culture, blood (routine x 2)     Status: None (Preliminary result)    Collection Time: 05/31/14 12:30 PM  Result Value Ref Range Status   Specimen Description BLOOD RIGHT ARM  Final   Special Requests   Final    BOTTLES DRAWN AEROBIC AND ANAEROBIC 10CC BOTH BOTTLES   Culture   Final           BLOOD CULTURE RECEIVED NO GROWTH TO DATE CULTURE WILL BE HELD FOR 5 DAYS BEFORE ISSUING A FINAL NEGATIVE REPORT Performed at [facility]    Report Status PENDING  Incomplete  Culture, Urine     Status: None   Collection Time: 06/01/14  3:16 PM  Result Value Ref Range Status   Specimen Description URINE, CATHETERIZED  Final   Special Requests NONE  Final   Colony Count NO GROWTH  Performed at [facility]   Final   Culture NO GROWTH Performed at [facility]   Final   Report Status 06/03/2014 FINAL  Final     Studies: No results found.  Scheduled Meds: . aspirin EC  325 mg Oral Daily  . calcitonin (salmon)  1 spray Alternating Nares Daily  . cefTRIAXone (ROCEPHIN)  IV  1 g Intravenous Q24H  . Difluprednate  1 drop Both Eyes BID  . digoxin  0.125 mg Oral Daily  . docusate sodium  300 mg Oral QHS  . enoxaparin (LOVENOX) injection  40 mg Subcutaneous Q24H  . fluticasone  1 spray Each Nare Daily  . furosemide  40 mg Oral BID  . hydrALAZINE  50 mg Oral 3 times per day  . metoprolol  50 mg Oral BID  . multivitamin with minerals  1 tablet Oral Daily  . PARoxetine  20 mg Oral Daily  . potassium chloride  40 mEq Oral Daily  . sodium chloride  3 mL Intravenous Q12H  . sodium chloride  3 mL Intravenous Q12H   Continuous Infusions:   Principal Problem:   Acute on chronic diastolic heart failure Active Problems:   Fall   Elevated troponin   UTI (lower urinary tract infection)   Uncontrolled hypertension   UTI (urinary tract infection)    Time spent: 25 minutes    Verneita Griffes, MD [facility] (P) [phone]

## 2014-06-04 DIAGNOSIS — R197 Diarrhea, unspecified: Secondary | ICD-10-CM

## 2014-06-04 LAB — BASIC METABOLIC PANEL
Anion gap: 9 (ref 5–15)
BUN: 23 mg/dL (ref 6–23)
CO2: 35 mmol/L — ABNORMAL HIGH (ref 19–32)
Calcium: 8.4 mg/dL (ref 8.4–10.5)
Chloride: 93 mEq/L — ABNORMAL LOW (ref 96–112)
Creatinine, Ser: 0.79 mg/dL (ref 0.50–1.10)
GFR calc Af Amer: 80 mL/min — ABNORMAL LOW (ref >90)
GFR calc non Af Amer: 69 mL/min — ABNORMAL LOW (ref >90)
Glucose, Bld: 122 mg/dL — ABNORMAL HIGH (ref 70–99)
Potassium: 2.7 mmol/L — CL (ref 3.5–5.1)
Sodium: 137 mmol/L (ref 135–145)

## 2014-06-04 LAB — CLOSTRIDIUM DIFFICILE BY PCR: Toxigenic C. Difficile by PCR: POSITIVE — AB

## 2014-06-04 LAB — GLUCOSE, CAPILLARY: Glucose-Capillary: 105 mg/dL — ABNORMAL HIGH (ref 70–99)

## 2014-06-04 MED ORDER — POTASSIUM CHLORIDE 20 MEQ/15ML (10%) PO SOLN
30.0000 meq | ORAL | Status: AC
Start: 2014-06-04 — End: 2014-06-04
  Administered 2014-06-04 (×2): 30 meq via ORAL
  Filled 2014-06-04 (×2): qty 30

## 2014-06-04 MED ORDER — RISAQUAD PO CAPS
1.0000 | ORAL_CAPSULE | Freq: Two times a day (BID) | ORAL | Status: DC
  Administered 2014-06-04 – 2014-06-09 (×10): 1 via ORAL
  Filled 2014-06-04 (×11): qty 1

## 2014-06-04 MED ORDER — CLINDAMYCIN HCL 300 MG PO CAPS
300.0000 mg | ORAL_CAPSULE | Freq: Three times a day (TID) | ORAL | Status: DC
  Administered 2014-06-04 – 2014-06-09 (×14): 300 mg via ORAL
  Filled 2014-06-04 (×16): qty 1

## 2014-06-04 MED ORDER — VANCOMYCIN 50 MG/ML ORAL SOLUTION
125.0000 mg | Freq: Four times a day (QID) | ORAL | Status: DC
  Administered 2014-06-04 – 2014-06-09 (×20): 125 mg via ORAL
  Filled 2014-06-04 (×26): qty 2.5

## 2014-06-04 MED ORDER — POTASSIUM CHLORIDE CRYS ER 20 MEQ PO TBCR
40.0000 meq | EXTENDED_RELEASE_TABLET | Freq: Three times a day (TID) | ORAL | Status: DC
  Administered 2014-06-04 – 2014-06-08 (×11): 40 meq via ORAL
  Filled 2014-06-04 (×12): qty 2

## 2014-06-04 MED ORDER — CETYLPYRIDINIUM CHLORIDE 0.05 % MT LIQD
7.0000 mL | Freq: Two times a day (BID) | OROMUCOSAL | Status: DC
  Administered 2014-06-04 – 2014-06-09 (×10): 7 mL via OROMUCOSAL

## 2014-06-04 MED ORDER — POTASSIUM CHLORIDE 20 MEQ/15ML (10%) PO SOLN
40.0000 meq | Freq: Two times a day (BID) | ORAL | Status: DC
  Administered 2014-06-04: 40 meq via ORAL

## 2014-06-04 NOTE — Evaluation (Signed)
Clinical/Bedside Swallow Evaluation Patient Details  Name: Jean Murphy MRN: [id number] Date of Birth: [birth year]  Today's Date: 06/04/2014 Time: 1130-1200 SLP Time Calculation (min) (ACUTE ONLY): 30 min  Past Medical History:  Past Medical History  Diagnosis Date  . CHF (congestive heart failure)   . Pleural effusion   . HTN (hypertension)   . Stroke   . Hyperlipidemia   . Diabetes mellitus without complication   . Compound fracture     left leg  . AAA (abdominal aortic aneurysm)   . Eczema   . Compression fracture     x 2    Past Surgical History:  Past Surgical History  Procedure Laterality Date  . Cholecystectomy    . Abdominal hysterectomy    . Knee surgery      right knee   . Corneal transplant     HPI:  81 y.o. female with history of chronic atrial fibrillation, hypertension, CHF, abdominal aortic aneurysm and compression fracture admitted after fall at ALF. Pt found to have mildly elevated troponin,labs revealed leukocytosis and UA shows possible UTI. CXR 1/6 revealed increased bilateral pleural effusions and basilar airspace disease   Assessment / Plan / Recommendation Clinical Impression  Evidence of laryngeal penetration and/or aspiration during meal observation (late breakfast tray).  Suspect delayed swallow intitiation, sluggish and intermittently audible swallows, immediate and delyed coughs with thin and nectar. Decreased signs with honey thick. She required moderate cues for smaller sips as sip size quite large. Downgraded diet texture to Dys 2, honey thick liquids, pills whole in applesauce and full supervision. MBS for 1/8 Discussed result and recommendations with RN.    Aspiration Risk  Moderate    Diet Recommendation Dysphagia 2 (Fine chop);Honey-thick liquid   Liquid Administration via: Straw;No straw Medication Administration: Whole meds with puree Supervision: Patient able to self feed;Full supervision/cueing for compensatory strategies;Staff to  assist with self feeding Compensations: Slow rate;Small sips/bites Postural Changes and/or Swallow Maneuvers: Seated upright 90 degrees;Upright 30-60 min after meal    Other  Recommendations Recommended Consults: MBS Oral Care Recommendations: Oral care BID   Follow Up Recommendations   (TBD)    Frequency and Duration min 2x/week  2 weeks   Pertinent Vitals/Pain none         Swallow Study           Oral/Motor/Sensory Function Overall Oral Motor/Sensory Function: Appears within functional limits for tasks assessed   Ice Chips Ice chips: Not tested   Thin Liquid Thin Liquid: Impaired Presentation: Cup;Straw Pharyngeal  Phase Impairments: Suspected delayed Swallow;Cough - Delayed;Cough - Immediate;Throat Clearing - Delayed;Throat Clearing - Immediate    Nectar Thick Nectar Thick Liquid: Impaired Presentation: Cup Pharyngeal Phase Impairments: Suspected delayed Swallow;Throat Clearing - Immediate;Cough - Delayed   Honey Thick Honey Thick Liquid: Impaired Presentation: Cup Pharyngeal Phase Impairments: Suspected delayed Swallow   Puree Puree: Not tested   Solid   GO    Solid: Impaired Oral Phase Impairments:  (prolonged transit and min lingual residue)       Mick Sell, Orbie Pyo 06/04/2014,12:19 PM  Orbie Pyo Colvin Caroli.Ed [employer] [phone]

## 2014-06-04 NOTE — Progress Notes (Signed)
C.diff results positive. MD paged with findings.  Continuing enteric precautions.  Will continue to monitor.

## 2014-06-04 NOTE — Progress Notes (Signed)
Subjective:  No chest pain; breathing better  Objective:   Vital Signs in the last 24 hours: Temp:  [97.8 F (36.6 C)-98 F (36.7 C)] 98 F (36.7 C) (01/07 0547) Pulse Rate:  [56-78] 74 (01/07 0547) Resp:  [18] 18 (01/07 0547) BP: (100-160)/(47-82) 135/59 mmHg (01/07 0547) SpO2:  [88 %-98 %] 98 % (01/07 0547) Weight:  [131 lb 13.4 oz (59.8 kg)] 131 lb 13.4 oz (59.8 kg) (01/07 0547)   Weight 137 ==>131  Intake/Output from previous day: 01/06 0701 - 01/07 0700 In: 340 [P.O.:240; IV Piggyback:100] Out: 7846 [Urine:1575] Net:  -9629  I/O since admission: -5347   Medications: . aspirin EC  325 mg Oral Daily  . calcitonin (salmon)  1 spray Alternating Nares Daily  . clindamycin (CLEOCIN) IV  300 mg Intravenous 3 times per day  . Difluprednate  1 drop Both Eyes BID  . digoxin  0.125 mg Oral Daily  . docusate sodium  300 mg Oral QHS  . enoxaparin (LOVENOX) injection  40 mg Subcutaneous Q24H  . fluticasone  1 spray Each Nare Daily  . furosemide  40 mg Oral BID  . hydrALAZINE  50 mg Oral 3 times per day  . metoprolol  50 mg Oral BID  . multivitamin with minerals  1 tablet Oral Daily  . PARoxetine  20 mg Oral Daily  . potassium chloride  30 mEq Oral Q4H  . potassium chloride  40 mEq Oral Daily  . sodium chloride  3 mL Intravenous Q12H  . sodium chloride  3 mL Intravenous Q12H       Physical Exam:   General appearance: alert, cooperative and no distress Neck:  JVD increased to ~8 cm; no adenopathy, no carotid bruit, supple, symmetrical, trachea midline and thyroid not enlarged, symmetric, no tenderness/mass/nodules Lungs: decreased BS at bases Heart: irregularly irregular rhythm 1/6 sem Abdomen: soft, non-tender; bowel sounds normal; no masses,  no organomegaly Extremities: improved edema, now trace, mildly erythematous Neurologic: Grossly normal   Rate: 60 - 70's  Rhythm: atrial fibrillation  ECG (independently read by me): Wandering baseline  With probable AF  88  Lab Results:  BMP Latest Ref Rng 06/04/2014 06/03/2014 06/02/2014  Glucose 70 - 99 mg/dL 122(H) 85 86  BUN 6 - 23 mg/dL 23 22 22   Creatinine 0.50 - 1.10 mg/dL 0.79 0.76 0.72  Sodium 135 - 145 mmol/L 137 133(L) 140  Potassium 3.5 - 5.1 mmol/L 2.7(LL) 4.0 3.4(L)  Chloride 96 - 112 mEq/L 93(L) 93(L) 95(L)  CO2 19 - 32 mmol/L 35(H) 30 40(HH)  Calcium 8.4 - 10.5 mg/dL 8.4 8.4 8.3(L)     CBC Latest Ref Rng 06/03/2014 06/02/2014 06/01/2014  WBC 4.0 - 10.5 K/uL 19.0(H) 8.3 8.4  Hemoglobin 12.0 - 15.0 g/dL 13.9 12.3 12.3  Hematocrit 36.0 - 46.0 % 44.0 38.8 38.3  Platelets 150 - 400 K/uL 238 233 233     No results for input(s): TROPONINI in the last 72 hours.  Invalid input(s): CK, MB  Hepatic Function Panel No results for input(s): PROT, ALBUMIN, AST, ALT, ALKPHOS, BILITOT, BILIDIR, IBILI in the last 72 hours. No results for input(s): INR in the last 72 hours. BNP (last 3 results)   BNP (not Pro-BNP) 691 on 05/30/14 =>681 today  Lipid Panel  No results found for: CHOL, TRIG, HDL, CHOLHDL, VLDL, LDLCALC, LDLDIRECT    Imaging:  Dg Chest Port 1 View  06/03/2014   CLINICAL DATA:  Recent diagnosis of pneumonia. New lower lung crackles.  EXAM: PORTABLE CHEST - 1 VIEW  COMPARISON:  PA and lateral chest 05/30/2014 and 01/20/2015.  FINDINGS: There is cardiomegaly and interstitial edema. Moderate bilateral pleural effusions are seen with associated basilar airspace disease. No pneumothorax.  IMPRESSION: Increased bilateral pleural effusions and basilar airspace disease which could be atelectasis or pneumonia. Atelectasis is favored.  Cardiomegaly and interstitial edema.   Electronically Signed   By: Inge Rise M.D.   On: 06/03/2014 15:55    03/07/2014 Echo Study Conclusions  - Left ventricle: The cavity size was normal. Systolic function was vigorous. The estimated ejection fraction was in the range of 65% to 70%. Wall motion was normal; there were no regional wall motion  abnormalities. The study was not technically sufficient to allow evaluation of LV diastolic dysfunction due to atrial fibrillation. - Aortic valve: Trileaflet; moderately thickened, moderately calcified leaflets. Sclerosis without stenosis. There was no regurgitation. - Aortic root: The aortic root was normal in size. - Mitral valve: There was trivial regurgitation. - Left atrium: The atrium was mildly dilated. - Right ventricle: The cavity size was moderately dilated. Wall thickness was normal. Systolic function was moderately reduced. - Right atrium: The atrium was normal in size. - Pulmonic valve: There was no regurgitation. - Pulmonary arteries: Systolic pressure was moderately increased. PA peak pressure: 54 mm Hg (S).  Impressions:  - Normal left ventricular size and function. Moderately dilated right ventricle with moderately impaired systolic function. Moderate pulmonary hypertension.   Assessment/Plan:   Principal Problem:   Acute on chronic diastolic heart failure Active Problems:   Fall   Elevated troponin   UTI (lower urinary tract infection)   Uncontrolled hypertension   UTI (urinary tract infection)  1. Acute on chronic diastolic dysfunction; BNP 681; -5347 net output since admission; now on oral lasix at 40 mg po bid.  2. Mildly positive troponin; medical therapy, probably contributed by CHF 3. Permanent AF; with fall history not on AC; but just ASA 4. LE edema improving but still 1+ contributed by  probable venous insufficiency; mildly erythematous 5. New frequent diarrhea;  eval for c dif 5. Hypo K: today again low at 2.7;probably contributed by diarrhea;  replete to  keep >4.0 6. Moderate pulmonary HTN with PA pp est at 54 mmHg 7. UTI 8. Leukocytosis:  WBC increased to 19.0 yesterday; on cleocin iv   Troy Sine, MD, [facility] 06/04/2014, 7:56 AM

## 2014-06-04 NOTE — Progress Notes (Signed)
[facility] PROGRESS NOTE  Jean Murphy QJJ:[id number] DOB: 10-Mar-1918 DOA: 05/30/2014 PCP: Antony Blackbird, MD  82 y/o ? known Diastolic CHF on home O2, Chronic Afib not on AC 2/2 to falls/risk catastrophic bleed, AAA, Htn, Prior CVA [undocumented] who was admitted 05/30/14 with fall without # but ? Pyelo and decompnesated chf and Htn urgency.  Cardiology consulted Found to be clinically aspirating and subsequently was changed from Roccephin to Clindamycin on 1/6 unfortuantely had diarrhea and Cdiff found to be + P CXR from 1/6 atelectasis>PNA    Assessment/Plan: #1 acute on chronic diastolic heart failure Patient is presented with a fall and shortness of breath. Physical exam with crackles noted on pulmonary exam on hospital day 1. Clinical improvement. Pro BNP is elevated on admission. Chest x-ray consistent with a mild CHF on admission. Cardiac enzymes slightly elevated. Patient with recent 2-D echo on 02/26/2014 with a EF of 65-70% with no wall motion abnormalities. Moderately thickened aortic valve calcified leaflets. Moderately dilated right ventricle with moderately impaired systolic function. Moderate pulmonary hypertension.  Strict I's and O's. Daily weights. I/O = -5.22 L. IV Lasix  to PO 40 bid 06/03/14. Cardiology following and appreciate input and recommendations.  Multifactorial early sepsis secondary to suspect of pyelonephritis, urine culture -1/4 /Aspiration pneumonia/C. difficile colitis-initially on Rocephin changed to clindamycin unfortunately during this period of time developed aspiration Placed on clindamycin as allergies to penicillin Developed C. difficile colitis 1/7 Will treat patient with about 10 days of clindamycin by mouth and double cover with vancomycin by mouth as well. Start acidophilus as well Long discussion with daughter who states she is in a normal state of health plays cards and is fully functional at baseline  Toxic metabolic encephalopathy secondary  to sepsis, question of Lewy body disease- secondary to above issues    profound hypokalemia-secondary to diarrhea ,  replacing with by mouth Kdur 40 tid Rpt labs am  #3 Fall PT/OT.  #4 elevated troponin. Patient denies any chest pain. Aspirin. Patient with recent 2-D echo and per cardiology no need to repeat echo at this time. Patient denies any chest pain. It is elevated troponin may have been secondary to right ventricle a strain. Cardiology following and appreciate input and recommendations.  #5 atrial fibrillation Patient with chronic falls, and a such not an anticoagulation candidate. Continue Lopressor 50 bid and digoxin 0.125 for rate control.  #6 hypertension Stable. Continue Lopressor, lasix and digoxin.  #7 diabetes mellitus  Hgb A1c = 5.9. Discontinue SSI [she is 89-risks of hypoglycemia outweigh benefit of tight glycemic control]   #9 history of abdominal aortic aneurysm Stable. Asymptomatic. Outpatient follow-up.  #10 prophylaxis Lovenox for DVT prophylaxis.   DNAR Discussed c daughter [name] who is a nurse Inpatient pending resolution    Consultants:  Cardiology: Dr. Acie Fredrickson 05/31/2014  Procedures:  CT head without contrast 05/30/2014  Chest x-ray 05/30/2014  X-ray of the L-spine 01//2016  Antibiotics:  IV Rocephin 05/31/2013  HPI/Subjective:  Pleasantly confused 6 stools today Poor PO No other repsoted issures per RN Has some back breakdown    Objective: Filed Vitals:   06/04/14 0547  BP: 135/59  Pulse: 74  Temp: 98 F (36.7 C)  Resp: 18    Intake/Output Summary (Last 24 hours) at 06/04/14 1530 Last data filed at 06/04/14 1100  Gross per 24 hour  Intake    280 ml  Output    800 ml  Net   -520 ml   Filed Weights   06/02/14 0502  06/03/14 0627 06/04/14 0547  Weight: 60.8 kg (134 lb 0.6 oz) 61.2 kg (134 lb 14.7 oz) 59.8 kg (131 lb 13.4 oz)    Exam:   General:  NAD  Cardiovascular: RRR  Respiratory: Decreased BS in  bases. Crackles L lower chest  Abdomen: Soft, nontender, nondistended, positive bowel sounds.    Data Reviewed: Basic Metabolic Panel:  Recent Labs Lab 05/31/14 0050 06/01/14 0439 06/02/14 0414 06/03/14 0419 06/04/14 0412  NA 142 141 140 133* 137  K 3.3* 3.3* 3.4* 4.0 2.7*  CL 95* 96 95* 93* 93*  CO2 39* 38* 40* 30 35*  GLUCOSE 127* 86 86 85 122*  BUN 23 24* 22 22 23   CREATININE 0.84 0.67 0.72 0.76 0.79  CALCIUM 8.4 8.0* 8.3* 8.4 8.4  MG  --  1.5 1.7 1.9  --    Liver Function Tests:  Recent Labs Lab 05/31/14 0050  AST 29  ALT 13  ALKPHOS 57  BILITOT 1.2  PROT 5.9*  ALBUMIN 2.9*   No results for input(s): LIPASE, AMYLASE in the last 168 hours. No results for input(s): AMMONIA in the last 168 hours. CBC:  Recent Labs Lab 05/30/14 2040 05/30/14 2057 05/31/14 0050 06/01/14 0439 06/02/14 0414 06/03/14 0630  WBC 12.4*  --  15.2* 8.4 8.3 19.0*  NEUTROABS 10.8*  --  13.6*  --   --   --   HGB 14.9 17.3* 14.7 12.3 12.3 13.9  HCT 47.3* 51.0* 46.1* 38.3 38.8 44.0  MCV 98.1  --  98.5 99.7 99.2 99.8  PLT 259  --  283 233 233 238   Cardiac Enzymes:  Recent Labs Lab 05/30/14 2040 05/30/14 2106 05/31/14 0050 05/31/14 0547 05/31/14 1230  CKTOTAL 147  --   --   --   --   TROPONINI  --  0.21* 0.26* 0.21* 0.22*   BNP (last 3 results) No results for input(s): PROBNP in the last 8760 hours. CBG:  Recent Labs Lab 06/03/14 0805 06/03/14 1130 06/03/14 1642 06/03/14 2137 06/04/14 0739  GLUCAP 119* 112* 159* 116* 105*    Recent Results (from the past 240 hour(s))  MRSA PCR Screening     Status: None   Collection Time: 05/31/14  3:30 AM  Result Value Ref Range Status   MRSA by PCR NEGATIVE NEGATIVE Final    Comment:        The GeneXpert MRSA Assay (FDA approved for NASAL specimens only), is one component of a comprehensive MRSA colonization surveillance program. It is not intended to diagnose MRSA infection nor to guide or monitor treatment for MRSA  infections.   Culture, blood (routine x 2)     Status: None (Preliminary result)   Collection Time: 05/31/14  9:43 AM  Result Value Ref Range Status   Specimen Description BLOOD RIGHT ARM  Final   Special Requests   Final    BOTTLES DRAWN AEROBIC AND ANAEROBIC 10CC BOTH BOTTLES   Culture   Final           BLOOD CULTURE RECEIVED NO GROWTH TO DATE CULTURE WILL BE HELD FOR 5 DAYS BEFORE ISSUING A FINAL NEGATIVE REPORT Performed at [facility]    Report Status PENDING  Incomplete  Culture, blood (routine x 2)     Status: None (Preliminary result)   Collection Time: 05/31/14 12:30 PM  Result Value Ref Range Status   Specimen Description BLOOD RIGHT ARM  Final   Special Requests   Final    BOTTLES DRAWN AEROBIC  AND ANAEROBIC 10CC BOTH BOTTLES   Culture   Final           BLOOD CULTURE RECEIVED NO GROWTH TO DATE CULTURE WILL BE HELD FOR 5 DAYS BEFORE ISSUING A FINAL NEGATIVE REPORT Performed at [facility]    Report Status PENDING  Incomplete  Culture, Urine     Status: None   Collection Time: 06/01/14  3:16 PM  Result Value Ref Range Status   Specimen Description URINE, CATHETERIZED  Final   Special Requests NONE  Final   Colony Count NO GROWTH Performed at [facility]   Final   Culture NO GROWTH Performed at [facility]   Final   Report Status 06/03/2014 FINAL  Final  Clostridium Difficile by PCR     Status: Abnormal   Collection Time: 06/04/14  5:51 AM  Result Value Ref Range Status   C difficile by pcr POSITIVE (A) NEGATIVE Final    Comment: CRITICAL RESULT CALLED TO, READ BACK BY AND VERIFIED WITH: Norberta Keens RN 11:35 06/04/14 ([email or web address]) Performed at [facility]      Studies: Dg Chest Port 1 View  06/03/2014   CLINICAL DATA:  Recent diagnosis of pneumonia. New lower lung crackles.  EXAM: PORTABLE CHEST - 1 VIEW  COMPARISON:  PA and lateral chest 05/30/2014 and 01/20/2015.  FINDINGS: There is cardiomegaly and interstitial edema.  Moderate bilateral pleural effusions are seen with associated basilar airspace disease. No pneumothorax.  IMPRESSION: Increased bilateral pleural effusions and basilar airspace disease which could be atelectasis or pneumonia. Atelectasis is favored.  Cardiomegaly and interstitial edema.   Electronically Signed   By: Inge Rise M.D.   On: 06/03/2014 15:55    Scheduled Meds: . aspirin EC  325 mg Oral Daily  . calcitonin (salmon)  1 spray Alternating Nares Daily  . Difluprednate  1 drop Both Eyes BID  . digoxin  0.125 mg Oral Daily  . docusate sodium  300 mg Oral QHS  . enoxaparin (LOVENOX) injection  40 mg Subcutaneous Q24H  . fluticasone  1 spray Each Nare Daily  . furosemide  40 mg Oral BID  . hydrALAZINE  50 mg Oral 3 times per day  . metoprolol  50 mg Oral BID  . multivitamin with minerals  1 tablet Oral Daily  . PARoxetine  20 mg Oral Daily  . potassium chloride  40 mEq Oral BID  . sodium chloride  3 mL Intravenous Q12H  . sodium chloride  3 mL Intravenous Q12H  . vancomycin  125 mg Oral QID   Continuous Infusions:   Principal Problem:   Acute on chronic diastolic heart failure Active Problems:   Fall   Elevated troponin   UTI (lower urinary tract infection)   Uncontrolled hypertension   UTI (urinary tract infection)    Time spent: 25 minutes    Verneita Griffes, MD [facility] (P) [phone]

## 2014-06-04 NOTE — Progress Notes (Signed)
Physical Therapy Treatment Patient Details Name: Jean Murphy MRN: [id number] DOB: [birth year] Today's Date: 06/04/2014    History of Present Illness 81 yo female admitted with fall. Hx of chronic Afib, CHF, CVA, AAA, HTN, DM. compresion fxs (T10, L2).     PT Comments    Pt in bed on arrival.  Initially she thought she was at the "[employer]".  Oriented to time/place/situation.  Found pt to be incont loose stools.  Assisted with hygiene twice due to X 2 loose stools.  Assited OOB to [facility] + 2 assist due to increased weakness.  Moore loose stools.  Assisted with hygiene then positioned in recliner.   Unable to attempt amb due to uncontrolled loose stools.    Follow Up Recommendations  SNF     Equipment Recommendations  None recommended by PT    Recommendations for Other Services       Precautions / Restrictions Precautions Precautions: Fall Restrictions Weight Bearing Restrictions: No    Mobility  Bed Mobility Overal bed mobility: Needs Assistance Bed Mobility: Supine to Sit;Rolling Rolling: Mod assist   Supine to sit: Mod assist;Max assist     General bed mobility comments: required increased assist and time today.  Rolling sisde to side for hygiene (loose stools twice)  Transfers Overall transfer level: Needs assistance Equipment used: Rolling walker (2 wheeled)   Sit to Stand: Max assist         General transfer comment: Max VC's from bed to [facility] then to recliner.  Ambulation/Gait             General Gait Details: unable to attempt amb due to constant loose stools.   Stairs            Wheelchair Mobility    Modified Rankin (Stroke Patients Only)       Balance                                    Cognition Arousal/Alertness: Awake/alert Behavior During Therapy: Flat affect Overall Cognitive Status: Impaired/Different from baseline Area of Impairment: Orientation Orientation Level: Place;Time;Situation Current Attention  Level: Alternating           General Comments: required max VC's for orientation.  Initialy pt thought she was at the [employer].    Exercises      General Comments        Pertinent Vitals/Pain Pain Assessment: No/denies pain    Home Living                      Prior Function            PT Goals (current goals can now be found in the care plan section) Progress towards PT goals: Progressing toward goals    Frequency  Min 3X/week    PT Plan      Co-evaluation             End of Session Equipment Utilized During Treatment: Gait belt;Oxygen (home O2) Activity Tolerance: Patient limited by fatigue Patient left: in chair;with call bell/phone within reach     Time: 1030-1100 PT Time Calculation (min) (ACUTE ONLY): 30 min  Charges:  $Gait Training: 8-22 mins $Therapeutic Activity: 8-22 mins                    G Codes:      Deboraha Goar  PTA [facility]  Acute  Rehab Pager      [phone]

## 2014-06-04 NOTE — Progress Notes (Signed)
CSW confirmed with patient's daughter, [name] (w#: [phone]) that they chose [facility], confirmed with Melissa at SNF that they would be able to take patient when ready.   Clinical Social Work Department CLINICAL SOCIAL WORK PLACEMENT NOTE 06/04/2014  Patient:  Jean Murphy, Jean Murphy  Account Number:  [id number] Admit date:  05/30/2014  Clinical Social Worker:  Renold Genta  Date/time:  06/01/2014 03:27 PM  Clinical Social Work is seeking post-discharge placement for this patient at the following level of care:   SKILLED NURSING   (*CSW will update this form in Epic as items are completed)   06/01/2014  Patient/family provided with [facility] Department of Clinical Social Work's list of facilities offering this level of care within the geographic area requested by the patient (or if unable, by the patient's family).  06/01/2014  Patient/family informed of their freedom to choose among providers that offer the needed level of care, that participate in Medicare, Medicaid or managed care program needed by the patient, have an available bed and are willing to accept the patient.  06/01/2014  Patient/family informed of [facility]' ownership interest in [facility], as well as of the fact that they are under no obligation to receive care at this facility.  PASARR submitted to EDS on 06/01/2014 PASARR number received on 06/01/2014  FL2 transmitted to all facilities in geographic area requested by pt/family on  06/01/2014 FL2 transmitted to all facilities within larger geographic area on   Patient informed that his/her managed care company has contracts with or will negotiate with  certain facilities, including the following:   [employer] Medicare/[employer]     Patient/family informed of bed offers received:  06/01/2014 Patient chooses bed at [place] Physician recommends and patient chooses bed at    Patient to be transferred to [place] on   Patient to be  transferred to facility by  Patient and family notified of transfer on  Name of family member notified:    The following physician request were entered in Epic:   Additional Comments:    [name], [place] Social Worker cell #: [phone]

## 2014-06-05 ENCOUNTER — Inpatient Hospital Stay (HOSPITAL_COMMUNITY): Payer: Commercial Managed Care - HMO

## 2014-06-05 DIAGNOSIS — D72829 Elevated white blood cell count, unspecified: Secondary | ICD-10-CM

## 2014-06-05 DIAGNOSIS — A09 Infectious gastroenteritis and colitis, unspecified: Secondary | ICD-10-CM

## 2014-06-05 LAB — CBC
HEMATOCRIT: 43.6 % (ref 36.0–46.0)
Hemoglobin: 14 g/dL (ref 12.0–15.0)
MCH: 31.3 pg (ref 26.0–34.0)
MCHC: 32.1 g/dL (ref 30.0–36.0)
MCV: 97.5 fL (ref 78.0–100.0)
Platelets: 308 10*3/uL (ref 150–400)
RBC: 4.47 MIL/uL (ref 3.87–5.11)
RDW: 14.7 % (ref 11.5–15.5)
WBC: 24 10*3/uL — ABNORMAL HIGH (ref 4.0–10.5)

## 2014-06-05 LAB — COMPREHENSIVE METABOLIC PANEL
ALK PHOS: 55 U/L (ref 39–117)
ALT: 9 U/L (ref 0–35)
ANION GAP: 8 (ref 5–15)
AST: 12 U/L (ref 0–37)
Albumin: 2.3 g/dL — ABNORMAL LOW (ref 3.5–5.2)
BUN: 29 mg/dL — ABNORMAL HIGH (ref 6–23)
CALCIUM: 7.7 mg/dL — AB (ref 8.4–10.5)
CHLORIDE: 92 meq/L — AB (ref 96–112)
CO2: 32 mmol/L (ref 19–32)
Creatinine, Ser: 0.9 mg/dL (ref 0.50–1.10)
GFR calc Af Amer: 61 mL/min — ABNORMAL LOW (ref >90)
GFR calc non Af Amer: 53 mL/min — ABNORMAL LOW (ref >90)
GLUCOSE: 104 mg/dL — AB (ref 70–99)
POTASSIUM: 3.2 mmol/L — AB (ref 3.5–5.1)
Sodium: 132 mmol/L — ABNORMAL LOW (ref 135–145)
Total Bilirubin: 0.8 mg/dL (ref 0.3–1.2)
Total Protein: 5.2 g/dL — ABNORMAL LOW (ref 6.0–8.3)

## 2014-06-05 LAB — GLUCOSE, CAPILLARY: Glucose-Capillary: 95 mg/dL (ref 70–99)

## 2014-06-05 LAB — MAGNESIUM: MAGNESIUM: 1.6 mg/dL (ref 1.5–2.5)

## 2014-06-05 NOTE — Progress Notes (Signed)
Subjective:  No chest pain;   Objective:   Vital Signs in the last 24 hours: Temp:  [98.2 F (36.8 C)-98.4 F (36.9 C)] 98.4 F (36.9 C) (01/08 0530) Pulse Rate:  [56-71] 65 (01/08 0530) Resp:  [18] 18 (01/08 0530) BP: (120-124)/(65-87) 124/65 mmHg (01/08 0530) SpO2:  [90 %-100 %] 90 % (01/08 0530) Weight:  [132 lb 4.4 oz (60 kg)] 132 lb 4.4 oz (60 kg) (01/08 0530)   Weight 137 ==>131==>132 today  Intake/Output from previous day: 01/07 0701 - 01/08 0700 In: 120 [P.O.:120] Out: -  Net:  +120  I/O since admission: -5227   Medications: . acidophilus  1 capsule Oral BID  . antiseptic oral rinse  7 mL Mouth Rinse BID  . aspirin EC  325 mg Oral Daily  . calcitonin (salmon)  1 spray Alternating Nares Daily  . clindamycin  300 mg Oral 3 times per day  . Difluprednate  1 drop Both Eyes BID  . digoxin  0.125 mg Oral Daily  . enoxaparin (LOVENOX) injection  40 mg Subcutaneous Q24H  . fluticasone  1 spray Each Nare Daily  . hydrALAZINE  50 mg Oral 3 times per day  . metoprolol  50 mg Oral BID  . multivitamin with minerals  1 tablet Oral Daily  . PARoxetine  20 mg Oral Daily  . potassium chloride  40 mEq Oral TID  . sodium chloride  3 mL Intravenous Q12H  . sodium chloride  3 mL Intravenous Q12H  . vancomycin  125 mg Oral QID       Physical Exam:   General appearance: alert, cooperative and no distress Neck:  JVD increased to ~8 cm; no adenopathy, no carotid bruit, supple, symmetrical, trachea midline and thyroid not enlarged, symmetric, no tenderness/mass/nodules Lungs: decreased BS at bases Heart: irregularly irregular rhythm 1/6 sem Abdomen: soft, non-tender; bowel sounds normal; no masses,  no organomegaly Extremities: improved edema, now trivial, mildly erythematous Neurologic: Grossly normal   Rate: 60 - 70's  Rhythm: atrial fibrillation  ECG (independently read by me): Wandering baseline  With probable AF 88  Lab Results:  BMP Latest Ref Rng 06/05/2014  06/04/2014 06/03/2014  Glucose 70 - 99 mg/dL 104(H) 122(H) 85  BUN 6 - 23 mg/dL 29(H) 23 22  Creatinine 0.50 - 1.10 mg/dL 0.90 0.79 0.76  Sodium 135 - 145 mmol/L 132(L) 137 133(L)  Potassium 3.5 - 5.1 mmol/L 3.2(L) 2.7(LL) 4.0  Chloride 96 - 112 mEq/L 92(L) 93(L) 93(L)  CO2 19 - 32 mmol/L 32 35(H) 30  Calcium 8.4 - 10.5 mg/dL 7.7(L) 8.4 8.4     CBC Latest Ref Rng 06/05/2014 06/03/2014 06/02/2014  WBC 4.0 - 10.5 K/uL 24.0(H) 19.0(H) 8.3  Hemoglobin 12.0 - 15.0 g/dL 14.0 13.9 12.3  Hematocrit 36.0 - 46.0 % 43.6 44.0 38.8  Platelets 150 - 400 K/uL 308 238 233     No results for input(s): TROPONINI in the last 72 hours.  Invalid input(s): CK, MB  Hepatic Function Panel  Recent Labs  06/05/14 0445  PROT 5.2*  ALBUMIN 2.3*  AST 12  ALT 9  ALKPHOS 55  BILITOT 0.8   No results for input(s): INR in the last 72 hours. BNP (last 3 results)   BNP (not Pro-BNP) 691 on 05/30/14 =>681 today  Lipid Panel  No results found for: CHOL, TRIG, HDL, CHOLHDL, VLDL, LDLCALC, LDLDIRECT    Imaging:  Dg Chest Port 1 View  06/03/2014   CLINICAL DATA:  Recent diagnosis of  pneumonia. New lower lung crackles.  EXAM: PORTABLE CHEST - 1 VIEW  COMPARISON:  PA and lateral chest 05/30/2014 and 01/20/2015.  FINDINGS: There is cardiomegaly and interstitial edema. Moderate bilateral pleural effusions are seen with associated basilar airspace disease. No pneumothorax.  IMPRESSION: Increased bilateral pleural effusions and basilar airspace disease which could be atelectasis or pneumonia. Atelectasis is favored.  Cardiomegaly and interstitial edema.   Electronically Signed   By: Inge Rise M.D.   On: 06/03/2014 15:55    03/07/2014 Echo Study Conclusions  - Left ventricle: The cavity size was normal. Systolic function was vigorous. The estimated ejection fraction was in the range of 65% to 70%. Wall motion was normal; there were no regional wall motion abnormalities. The study was not technically  sufficient to allow evaluation of LV diastolic dysfunction due to atrial fibrillation. - Aortic valve: Trileaflet; moderately thickened, moderately calcified leaflets. Sclerosis without stenosis. There was no regurgitation. - Aortic root: The aortic root was normal in size. - Mitral valve: There was trivial regurgitation. - Left atrium: The atrium was mildly dilated. - Right ventricle: The cavity size was moderately dilated. Wall thickness was normal. Systolic function was moderately reduced. - Right atrium: The atrium was normal in size. - Pulmonic valve: There was no regurgitation. - Pulmonary arteries: Systolic pressure was moderately increased. PA peak pressure: 54 mm Hg (S).  Impressions:  - Normal left ventricular size and function. Moderately dilated right ventricle with moderately impaired systolic function. Moderate pulmonary hypertension.   Assessment/Plan:   Principal Problem:   Acute on chronic diastolic heart failure Active Problems:   Fall   Elevated troponin   UTI (lower urinary tract infection)   Uncontrolled hypertension   UTI (urinary tract infection)  1. Acute on chronic diastolic dysfunction; BNP 681; -5227 net output since admission;   2. Mildly positive troponin; medical therapy, probably contributed by CHF 3. Permanent AF; with fall history not on AC; but just ASA 4. LE edema improving;  probable venous insufficiency; mildly erythematous 5. Diarrhea;  + for c dificile;  Diarrhea slowly improving today 5. Hypo K: yesterday low at 2.7;probably contributed by diarrhea; today 3.2 replete to  keep >4.0 6. Moderate pulmonary HTN with PA pp est at 54 mmHg 7. UTI 8. Leukocytosis:  WBC increased to 19.0 yesterday; today further increased to 24K; on vanc and clindamycin. For CXR today.   Troy Sine, MD, [facility] 06/05/2014, 8:34 AM

## 2014-06-05 NOTE — Progress Notes (Addendum)
CSW confirmed with Melissa at [facility] that they would be able to take patient over the weekend if cleared for discharge - if so, please contact the weekend, CSW Rollene Fare (ph#: [phone]) to facilitate discharge.     [name], [facility] Clinical Social Worker cell #: [phone]

## 2014-06-05 NOTE — Progress Notes (Addendum)
[facility] PROGRESS NOTE  Jean Murphy NWG:[id number] DOB: Jun 18, 1917 DOA: 05/30/2014 PCP: Jean Blackbird, MD  81 y/o ? known Diastolic CHF on home O2, Chronic Afib not on AC 2/2 to falls/risk catastrophic bleed, AAA, Htn, Prior CVA [undocumented] who was admitted 05/30/14 with fall without # but ? Pyelo and decompnesated chf and Htn urgency.  Cardiology consulted Found to be clinically aspirating and subsequently was changed from Roccephin to Clindamycin on 1/6 unfortuantely had diarrhea and Cdiff found to be + P CXR from 1/6 atelectasis>PNA, and on CXR 06/05/14 PNA was entertained as a diagnosis so she was concurrently Rx for both Aspiration and Cdiff Her mentation declined over course of acute illness but started to get better   Assessment/Plan: #1 acute on chronic diastolic heart failure Patient is presented with a fall and shortness of breath. Physical exam with crackles noted on pulmonary exam on hospital day 1. Clinical improvement. Pro BNP is elevated on admission.  Chest x-ray consistent with a mild CHF on admission. Cardiac enzymes slightly elevated.  2-D echo on 02/26/2014 with a EF of 65-70% with no wall motion abnormalities.. Moderate pulmonary hypertension.  Strict I's and O's. Daily weights. I/O = -5.22 L.  IV Lasix  to PO 40 bid 06/03/14--HELD dose on 1/8 2/2 Started back Lasix 40 po daily per Cardiology 1/9  Multifactorial early sepsis secondary to suspect of pyelonephritis, urine culture -1/4 /Aspiration pneumonia/C. difficile colitis- initially on Rocephin  clindamycin 1/7 2/2 aspiration [ as allergies to penicillin] Developed C. difficile colitis 1/7 Will treat patient with about 10 days of clindamycin by mouth and double cover with vancomycin by mouth as well. Start acidophilus as well  Toxic metabolic encephalopathy secondary to sepsis, question of Lewy body disease- secondary to above issues  Long discussion with daughter 1/7 states she is in a normal state of health  plays cards and is fully functional at baseline Mental status slowly improving 1/9   profound hypokalemia-secondary to diarrhea ,  replacing with by mouth  Kdur 40 tid Rpt labs am 1/10  #3 Fall PT/OT rec SNF-Reassess on Monday 06/08/13 again as mentation and stools are clearing  #4 elevated troponin.  It is elevated troponin ? secondary to right ventricle a strain. Cardiology following and appreciate input and recommendations.  #5 atrial fibrillation Patient with chronic falls, and a such not an anticoagulation candidate. Continue Lopressor 50 bid and digoxin 0.125 for rate control.  #6 hypertension Stable. Continue Lopressor, lasix and digoxin.  #7 diabetes mellitus  Hgb A1c = 5.9.  Discontinue SSI [she is 89-risks of hypoglycemia outweigh benefit of tight glycemic control]  #9 history of abdominal aortic aneurysm Stable. Asymptomatic. Outpatient follow-up.  #10 prophylaxis Lovenox for DVT prophylaxis.   DNAR Discussed c daughter Jean Murphy who is a nurse Inpatient pending resolution    Consultants:  Cardiology: Jean Murphy 05/31/2014  Procedures:  CT head without contrast 05/30/2014  Chest x-ray 05/30/2014  X-ray of the L-spine 01//2016  Antibiotics:  IV Rocephin 05/31/2013  HPI/Subjective:  3 liquid stools Large amounts Mentally seems to be clearing a bit No cp or SOB Eating some, but early satiety  Objective: Filed Vitals:   06/05/14 1353  BP: 126/81  Pulse: 69  Temp: 98.3 F (36.8 C)  Resp: 18   No intake or output data in the 24 hours ending 06/05/14 1404 Filed Weights   06/03/14 0627 06/04/14 0547 06/05/14 0530  Weight: 61.2 kg (134 lb 14.7 oz) 59.8 kg (131 lb 13.4 oz) 60 kg (132  lb 4.4 oz)    Exam:   General:  NAD  Cardiovascular: RRR  Respiratory: Decreased BS in bases. Crackles L lower chest  Abdomen: Soft, nontender, nondistended, positive bowel sounds.  SKin-stage 1 breakdown in natal cleft 1/9   Data Reviewed: Basic  Metabolic Panel:  Recent Labs Lab 06/01/14 0439 06/02/14 0414 06/03/14 0419 06/04/14 0412 06/05/14 0445  NA 141 140 133* 137 132*  K 3.3* 3.4* 4.0 2.7* 3.2*  CL 96 95* 93* 93* 92*  CO2 38* 40* 30 35* 32  GLUCOSE 86 86 85 122* 104*  BUN 24* 22 22 23  29*  CREATININE 0.67 0.72 0.76 0.79 0.90  CALCIUM 8.0* 8.3* 8.4 8.4 7.7*  MG 1.5 1.7 1.9  --  1.6   Liver Function Tests:  Recent Labs Lab 05/31/14 0050 06/05/14 0445  AST 29 12  ALT 13 9  ALKPHOS 57 55  BILITOT 1.2 0.8  PROT 5.9* 5.2*  ALBUMIN 2.9* 2.3*   No results for input(s): LIPASE, AMYLASE in the last 168 hours. No results for input(s): AMMONIA in the last 168 hours. CBC:  Recent Labs Lab 05/30/14 2040  05/31/14 0050 06/01/14 0439 06/02/14 0414 06/03/14 0630 06/05/14 0445  WBC 12.4*  --  15.2* 8.4 8.3 19.0* 24.0*  NEUTROABS 10.8*  --  13.6*  --   --   --   --   HGB 14.9  < > 14.7 12.3 12.3 13.9 14.0  HCT 47.3*  < > 46.1* 38.3 38.8 44.0 43.6  MCV 98.1  --  98.5 99.7 99.2 99.8 97.5  PLT 259  --  283 233 233 238 308  < > = values in this interval not displayed. Cardiac Enzymes:  Recent Labs Lab 05/30/14 2040 05/30/14 2106 05/31/14 0050 05/31/14 0547 05/31/14 1230  CKTOTAL 147  --   --   --   --   TROPONINI  --  0.21* 0.26* 0.21* 0.22*   BNP (last 3 results) No results for input(s): PROBNP in the last 8760 hours. CBG:  Recent Labs Lab 06/03/14 1130 06/03/14 1642 06/03/14 2137 06/04/14 0739 06/05/14 0737  GLUCAP 112* 159* 116* 105* 95    Recent Results (from the past 240 hour(s))  MRSA PCR Screening     Status: None   Collection Time: 05/31/14  3:30 AM  Result Value Ref Range Status   MRSA by PCR NEGATIVE NEGATIVE Final    Comment:        The GeneXpert MRSA Assay (FDA approved for NASAL specimens only), is one component of a comprehensive MRSA colonization surveillance program. It is not intended to diagnose MRSA infection nor to guide or monitor treatment for MRSA infections.     Culture, blood (routine x 2)     Status: None (Preliminary result)   Collection Time: 05/31/14  9:43 AM  Result Value Ref Range Status   Specimen Description BLOOD RIGHT ARM  Final   Special Requests   Final    BOTTLES DRAWN AEROBIC AND ANAEROBIC 10CC BOTH BOTTLES   Culture   Final           BLOOD CULTURE RECEIVED NO GROWTH TO DATE CULTURE WILL BE HELD FOR 5 DAYS BEFORE ISSUING A FINAL NEGATIVE REPORT Performed at [facility]    Report Status PENDING  Incomplete  Culture, blood (routine x 2)     Status: None (Preliminary result)   Collection Time: 05/31/14 12:30 PM  Result Value Ref Range Status   Specimen Description BLOOD RIGHT ARM  Final   Special Requests   Final    BOTTLES DRAWN AEROBIC AND ANAEROBIC 10CC BOTH BOTTLES   Culture   Final           BLOOD CULTURE RECEIVED NO GROWTH TO DATE CULTURE WILL BE HELD FOR 5 DAYS BEFORE ISSUING A FINAL NEGATIVE REPORT Performed at [facility]    Report Status PENDING  Incomplete  Culture, Urine     Status: None   Collection Time: 06/01/14  3:16 PM  Result Value Ref Range Status   Specimen Description URINE, CATHETERIZED  Final   Special Requests NONE  Final   Colony Count NO GROWTH Performed at [facility]   Final   Culture NO GROWTH Performed at [facility]   Final   Report Status 06/03/2014 FINAL  Final  Clostridium Difficile by PCR     Status: Abnormal   Collection Time: 06/04/14  5:51 AM  Result Value Ref Range Status   C difficile by pcr POSITIVE (A) NEGATIVE Final    Comment: CRITICAL RESULT CALLED TO, READ BACK BY AND VERIFIED WITH: Norberta Keens RN 11:35 06/04/14 ([email or web address]) Performed at [facility]      Studies: Dg Chest 2 View  06/05/2014   CLINICAL DATA:  Pneumococcal bronchitis.  EXAM: CHEST  2 VIEW  COMPARISON:  One-view chest 06/03/2014  FINDINGS: The heart is enlarged. Interstitial disease is slightly improved. Aeration is improved at the right lung base. Mild bibasilar  airspace disease persists. Bilateral pleural effusions persist. Atherosclerotic calcifications are noted at the aortic arch. The visualized soft tissues and bony thorax are unremarkable.  IMPRESSION: 1. Improving interstitial disease, likely reduced edema. 2. Persistent moderate bilateral pleural effusions, left greater than right. 3. Persistent bibasilar airspace disease. While this likely represents atelectasis, infection is also considered.   Electronically Signed   By: Lawrence Santiago M.D.   On: 06/05/2014 09:09   Dg Chest Port 1 View  06/03/2014   CLINICAL DATA:  Recent diagnosis of pneumonia. New lower lung crackles.  EXAM: PORTABLE CHEST - 1 VIEW  COMPARISON:  PA and lateral chest 05/30/2014 and 01/20/2015.  FINDINGS: There is cardiomegaly and interstitial edema. Moderate bilateral pleural effusions are seen with associated basilar airspace disease. No pneumothorax.  IMPRESSION: Increased bilateral pleural effusions and basilar airspace disease which could be atelectasis or pneumonia. Atelectasis is favored.  Cardiomegaly and interstitial edema.   Electronically Signed   By: Inge Rise M.D.   On: 06/03/2014 15:55   Dg Swallowing Func-speech Pathology  06/05/2014   Macario Golds, CCC-SLP     06/05/2014  1:06 PM Objective Swallowing Evaluation: Modified Barium Swallowing Study   Patient Details  Name: Serenity Batley MRN: [id number] Date of Birth: 23-Nov-1917  Today's Date: 06/05/2014 Time: 1210-1240 SLP Time Calculation (min) (ACUTE ONLY): 30 min  Past Medical History:  Past Medical History  Diagnosis Date  . CHF (congestive heart failure)   . Pleural effusion   . HTN (hypertension)   . Stroke   . Hyperlipidemia   . Diabetes mellitus without complication   . Compound fracture     left leg  . AAA (abdominal aortic aneurysm)   . Eczema   . Compression fracture     x 2    Past Surgical History:  Past Surgical History  Procedure Laterality Date  . Cholecystectomy    . Abdominal hysterectomy    . Knee surgery       right knee   . Corneal transplant  HPI:  81 y.o. female with history of chronic atrial fibrillation,  hypertension, CHF, abdominal aortic aneurysm and compression  fracture admitted after fall at ALF. Pt found to have mildly  elevated troponin,labs revealed leukocytosis and UA shows  possible UTI. CXR 1/6 revealed increased bilateral pleural  effusions and basilar airspace disease     Assessment / Plan / Recommendation Clinical Impression  Dysphagia Diagnosis: Moderate oral phase dysphagia;Moderate  pharyngeal phase dysphagia  Clinical impression: Moderate oropharyngeal with suspected  component of esophageal dysphagia.  Pt with decreased oral  coordination resulting in lingual rocking, oral residuals and  premature spillage of barium into pharynx.  Pharyngeal swallow  was delayed but overall strong without residuals.    Liquid swallows trigger at pyriform sinus allowing laryngeal  penetration before the swallow as pyriform fills.  Pt with  reflexive cough with liquid penetration that largely cleared  trace penetration.  Secretions also appeared to be in pharynx  mixing with barium.     Tsp amount of thin with chin tuck prevented penetration prior to  swallow initiation.  Oral residuals prematurely spill into  pharynx without adequate sensation and pt required verbal cues to  swallow.   Esophagus appeared with Pt will be a chronic aspiration risk due  to her sensorimotor deficits.   Appearance of delayed clearance  of esophagus with barium filling from distal to mid region  without pt awareness, liquid swallow (THIN tsp) faciliated  clearance, radiologist not present to confirm findings.    Options include dys3/thin diet with aspiration precautions (thin  appeared to clear esophagus better) or to modify to nectar thick  liquids.  Will defer to MD-of note pt reports h/o frequently  coughing with intake for two years that has improved.       Treatment Recommendation  Therapy as outlined in treatment plan below     Diet Recommendation Dysphagia 3 (Mechanical Soft);Thin liquid   Liquid Administration via: Straw Medication Administration: Whole meds with puree Supervision: Patient able to self feed;Full supervision/cueing  for compensatory strategies;Staff to assist with self feeding Compensations: Slow rate;Small sips/bites;Follow solids with  liquid (intermittent dry swallow) Postural Changes and/or Swallow Maneuvers: Seated upright 90  degrees;Upright 30-60 min after meal;Chin tuck    Other  Recommendations Oral Care Recommendations: Oral care BID   Follow Up Recommendations    follow up SLP for dysphagia management   Frequency and Duration min 2x/week  1 week     General Date of Onset: 06/05/14 HPI: 81 y.o. female with history of chronic atrial fibrillation,  hypertension, CHF, abdominal aortic aneurysm and compression  fracture admitted after fall at ALF. Pt found to have mildly  elevated troponin,labs revealed leukocytosis and UA shows  possible UTI. CXR 1/6 revealed increased bilateral pleural  effusions and basilar airspace disease Type of Study: Modified Barium Swallowing Study Reason for Referral: Objectively evaluate swallowing function Previous Swallow Assessment:  (none found) Diet Prior to this Study: Dysphagia 2 (chopped);Honey-thick  liquids Temperature Spikes Noted: No Respiratory Status: Nasal cannula History of Recent Intubation: No Behavior/Cognition: Alert;Pleasant mood;Cooperative;Requires  cueing Oral Cavity - Dentition: Dentures, top (natural lower) Oral Motor / Sensory Function: Impaired - see Bedside swallow  eval Self-Feeding Abilities: Able to feed self;Needs set up Patient Positioning: Upright in chair Baseline Vocal Quality: Clear Volitional Cough: Weak Volitional Swallow: Able to elicit Anatomy: Within functional limits    Reason for Referral Objectively evaluate swallowing function   Oral Phase Oral Preparation/Oral Phase Oral Phase: Impaired Oral - Nectar Oral -  Nectar Cup: Piecemeal  swallowing;Delayed oral  transit;Lingual/palatal residue;Lingual pumping Oral - Thin Oral - Thin Teaspoon: Piecemeal swallowing;Delayed oral  transit;Lingual/palatal residue;Lingual pumping Oral - Thin Straw: Piecemeal swallowing;Delayed oral  transit;Lingual/palatal residue;Lingual pumping Oral - Solids Oral - Puree: Piecemeal swallowing;Delayed oral  transit;Lingual/palatal residue;Weak lingual manipulation Oral - Regular: Piecemeal swallowing;Delayed oral  transit;Lingual/palatal residue;Weak lingual manipulation   Pharyngeal Phase Pharyngeal Phase Pharyngeal Phase: Impaired Pharyngeal - Nectar Pharyngeal - Nectar Teaspoon: Delayed swallow  initiation;Premature spillage to pyriform sinuses;Reduced tongue  base retraction Pharyngeal - Nectar Cup: Delayed swallow initiation;Premature  spillage to pyriform sinuses;Reduced tongue base  retraction;Penetration/Aspiration before swallow Penetration/Aspiration details (nectar cup): Material enters  airway, remains ABOVE vocal cords and not ejected out Pharyngeal - Thin Pharyngeal - Thin Teaspoon: Delayed swallow initiation;Premature  spillage to pyriform sinuses;Reduced tongue base  retraction;Penetration/Aspiration before swallow Penetration/Aspiration details (thin teaspoon): Material enters  airway, CONTACTS cords and not ejected out Pharyngeal - Thin Straw: Delayed swallow initiation;Premature  spillage to pyriform sinuses;Reduced tongue base  retraction;Penetration/Aspiration before swallow Penetration/Aspiration details (thin straw): Material enters  airway, CONTACTS cords and not ejected out Pharyngeal - Solids Pharyngeal - Puree: Delayed swallow initiation;Premature spillage  to valleculae;Reduced tongue base retraction Pharyngeal - Regular: Delayed swallow initiation;Premature  spillage to valleculae;Reduced tongue base retraction Pharyngeal Phase - Comment Pharyngeal Comment: trace laryngeal penetration  Cervical Esophageal Phase    GO    Cervical Esophageal  Phase Cervical Esophageal Phase: Impaired Cervical Esophageal Phase - Comment Cervical Esophageal Comment: appearance of delayed clearance of  esophagus with barium filling from distal to mid region without  pt awareness, liquid swallow (thin tsp) faciliated clearance,  radiologist not present to confirm findings          Luanna Salk, [facility] SLP [phone]     Scheduled Meds: . acidophilus  1 capsule Oral BID  . antiseptic oral rinse  7 mL Mouth Rinse BID  . aspirin EC  325 mg Oral Daily  . calcitonin (salmon)  1 spray Alternating Nares Daily  . clindamycin  300 mg Oral 3 times per day  . Difluprednate  1 drop Both Eyes BID  . digoxin  0.125 mg Oral Daily  . enoxaparin (LOVENOX) injection  40 mg Subcutaneous Q24H  . fluticasone  1 spray Each Nare Daily  . hydrALAZINE  50 mg Oral 3 times per day  . metoprolol  50 mg Oral BID  . multivitamin with minerals  1 tablet Oral Daily  . PARoxetine  20 mg Oral Daily  . potassium chloride  40 mEq Oral TID  . sodium chloride  3 mL Intravenous Q12H  . sodium chloride  3 mL Intravenous Q12H  . vancomycin  125 mg Oral QID   Continuous Infusions:   Principal Problem:   Acute on chronic diastolic heart failure Active Problems:   Fall   Elevated troponin   UTI (lower urinary tract infection)   Uncontrolled hypertension   UTI (urinary tract infection)    Time spent: 35 minutes    Verneita Griffes, MD [facility] (P) [phone]

## 2014-06-05 NOTE — Procedures (Signed)
Objective Swallowing Evaluation: Modified Barium Swallowing Study  Patient Details  Name: Jean Murphy MRN: [id number] Date of Birth: 19-Oct-1917  Today's Date: 06/05/2014 Time: 1210-1240 SLP Time Calculation (min) (ACUTE ONLY): 30 min  Past Medical History:  Past Medical History  Diagnosis Date  . CHF (congestive heart failure)   . Pleural effusion   . HTN (hypertension)   . Stroke   . Hyperlipidemia   . Diabetes mellitus without complication   . Compound fracture     left leg  . AAA (abdominal aortic aneurysm)   . Eczema   . Compression fracture     x 2    Past Surgical History:  Past Surgical History  Procedure Laterality Date  . Cholecystectomy    . Abdominal hysterectomy    . Knee surgery      right knee   . Corneal transplant     HPI:  81 y.o. female with history of chronic atrial fibrillation, hypertension, CHF, abdominal aortic aneurysm and compression fracture admitted after fall at ALF. Pt found to have mildly elevated troponin,labs revealed leukocytosis and UA shows possible UTI. CXR 1/6 revealed increased bilateral pleural effusions and basilar airspace disease     Assessment / Plan / Recommendation Clinical Impression  Dysphagia Diagnosis: Moderate oral phase dysphagia;Moderate pharyngeal phase dysphagia  Clinical impression: Moderate oropharyngeal with suspected component of esophageal dysphagia.  Pt with decreased oral coordination resulting in lingual rocking, oral residuals and premature spillage of barium into pharynx.  Pharyngeal swallow was delayed but overall strong without residuals.    Liquid swallows trigger at pyriform sinus allowing laryngeal penetration before the swallow as pyriform fills.  Pt with reflexive cough with liquid penetration that largely cleared trace penetration.  Secretions also appeared to be in pharynx mixing with barium.     Tsp amount of thin with chin tuck prevented penetration prior to swallow initiation.  Oral residuals  prematurely spill into pharynx without adequate sensation and pt required verbal cues to swallow.   Esophagus appeared with Pt will be a chronic aspiration risk due to her sensorimotor deficits.   Appearance of delayed clearance of esophagus with barium filling from distal to mid region without pt awareness, liquid swallow (THIN tsp) faciliated clearance, radiologist not present to confirm findings.    Options include dys3/thin diet with aspiration precautions (thin appeared to clear esophagus better) or to modify to nectar thick liquids.  Will defer to MD-of note pt reports h/o frequently coughing with intake for two years that has improved.       Treatment Recommendation  Therapy as outlined in treatment plan below    Diet Recommendation Dysphagia 3 (Mechanical Soft);Thin liquid   Liquid Administration via: Straw Medication Administration: Whole meds with puree Supervision: Patient able to self feed;Full supervision/cueing for compensatory strategies;Staff to assist with self feeding Compensations: Slow rate;Small sips/bites;Follow solids with liquid (intermittent dry swallow) Postural Changes and/or Swallow Maneuvers: Seated upright 90 degrees;Upright 30-60 min after meal;Chin tuck    Other  Recommendations Oral Care Recommendations: Oral care BID   Follow Up Recommendations    follow up SLP for dysphagia management   Frequency and Duration min 2x/week  1 week     General Date of Onset: 06/05/14 HPI: 81 y.o. female with history of chronic atrial fibrillation, hypertension, CHF, abdominal aortic aneurysm and compression fracture admitted after fall at ALF. Pt found to have mildly elevated troponin,labs revealed leukocytosis and UA shows possible UTI. CXR 1/6 revealed increased bilateral pleural effusions and basilar airspace disease  Type of Study: Modified Barium Swallowing Study Reason for Referral: Objectively evaluate swallowing function Previous Swallow Assessment:  (none  found) Diet Prior to this Study: Dysphagia 2 (chopped);Honey-thick liquids Temperature Spikes Noted: No Respiratory Status: Nasal cannula History of Recent Intubation: No Behavior/Cognition: Alert;Pleasant mood;Cooperative;Requires cueing Oral Cavity - Dentition: Dentures, top (natural lower) Oral Motor / Sensory Function: Impaired - see Bedside swallow eval Self-Feeding Abilities: Able to feed self;Needs set up Patient Positioning: Upright in chair Baseline Vocal Quality: Clear Volitional Cough: Weak Volitional Swallow: Able to elicit Anatomy: Within functional limits    Reason for Referral Objectively evaluate swallowing function   Oral Phase Oral Preparation/Oral Phase Oral Phase: Impaired Oral - Nectar Oral - Nectar Cup: Piecemeal swallowing;Delayed oral transit;Lingual/palatal residue;Lingual pumping Oral - Thin Oral - Thin Teaspoon: Piecemeal swallowing;Delayed oral transit;Lingual/palatal residue;Lingual pumping Oral - Thin Straw: Piecemeal swallowing;Delayed oral transit;Lingual/palatal residue;Lingual pumping Oral - Solids Oral - Puree: Piecemeal swallowing;Delayed oral transit;Lingual/palatal residue;Weak lingual manipulation Oral - Regular: Piecemeal swallowing;Delayed oral transit;Lingual/palatal residue;Weak lingual manipulation   Pharyngeal Phase Pharyngeal Phase Pharyngeal Phase: Impaired Pharyngeal - Nectar Pharyngeal - Nectar Teaspoon: Delayed swallow initiation;Premature spillage to pyriform sinuses;Reduced tongue base retraction Pharyngeal - Nectar Cup: Delayed swallow initiation;Premature spillage to pyriform sinuses;Reduced tongue base retraction;Penetration/Aspiration before swallow Penetration/Aspiration details (nectar cup): Material enters airway, remains ABOVE vocal cords and not ejected out Pharyngeal - Thin Pharyngeal - Thin Teaspoon: Delayed swallow initiation;Premature spillage to pyriform sinuses;Reduced tongue base retraction;Penetration/Aspiration  before swallow Penetration/Aspiration details (thin teaspoon): Material enters airway, CONTACTS cords and not ejected out Pharyngeal - Thin Straw: Delayed swallow initiation;Premature spillage to pyriform sinuses;Reduced tongue base retraction;Penetration/Aspiration before swallow Penetration/Aspiration details (thin straw): Material enters airway, CONTACTS cords and not ejected out Pharyngeal - Solids Pharyngeal - Puree: Delayed swallow initiation;Premature spillage to valleculae;Reduced tongue base retraction Pharyngeal - Regular: Delayed swallow initiation;Premature spillage to valleculae;Reduced tongue base retraction Pharyngeal Phase - Comment Pharyngeal Comment: trace laryngeal penetration  Cervical Esophageal Phase    GO    Cervical Esophageal Phase Cervical Esophageal Phase: Impaired Cervical Esophageal Phase - Comment Cervical Esophageal Comment: appearance of delayed clearance of esophagus with barium filling from distal to mid region without pt awareness, liquid swallow (thin tsp) faciliated clearance, radiologist not present to confirm findings          Luanna Salk, [place] [facility] [phone]

## 2014-06-06 LAB — CREATININE, SERUM
Creatinine, Ser: 0.83 mg/dL (ref 0.50–1.10)
GFR calc non Af Amer: 59 mL/min — ABNORMAL LOW (ref >90)
GFR, EST AFRICAN AMERICAN: 68 mL/min — AB (ref >90)

## 2014-06-06 LAB — GLUCOSE, CAPILLARY: Glucose-Capillary: 101 mg/dL — ABNORMAL HIGH (ref 70–99)

## 2014-06-06 LAB — CULTURE, BLOOD (ROUTINE X 2)
Culture: NO GROWTH
Culture: NO GROWTH

## 2014-06-06 MED ORDER — FUROSEMIDE 40 MG PO TABS
40.0000 mg | ORAL_TABLET | Freq: Every day | ORAL | Status: DC
  Administered 2014-06-06: 40 mg via ORAL
  Filled 2014-06-06: qty 1

## 2014-06-06 NOTE — Progress Notes (Signed)
Primary cardiologist: Dr. Mertie Moores  Seen for followup: Acute on chronic diastolic heart failure  Subjective:    Patient without verbal complaints.  Objective:   Temp:  [98 F (36.7 C)-98.3 F (36.8 C)] 98 F (36.7 C) (01/09 0554) Pulse Rate:  [66-69] 67 (01/09 0554) Resp:  [18-20] 18 (01/09 0554) BP: (126-146)/(69-86) 140/69 mmHg (01/09 0554) SpO2:  [99 %-100 %] 100 % (01/09 0554) Last BM Date: 06/05/14  Filed Weights   06/03/14 0627 06/04/14 0547 06/05/14 0530  Weight: 134 lb 14.7 oz (61.2 kg) 131 lb 13.4 oz (59.8 kg) 132 lb 4.4 oz (60 kg)    Intake/Output Summary (Last 24 hours) at 06/06/14 0742 Last data filed at 06/05/14 1300  Gross per 24 hour  Intake    480 ml  Output      0 ml  Net    480 ml    Telemetry: Rate controlled atrial fibrillation.  Exam:  General: No distress.  Lungs: Decreased breath sounds bases to mid lung zones.  Cardiac: Irregularly irregular without gallop.  Abdomen: NABS.  Extremities: Improving edema.  Lab Results:  Basic Metabolic Panel:  Recent Labs Lab 06/02/14 0414 06/03/14 0419 06/04/14 0412 06/05/14 0445 06/06/14 0440  NA 140 133* 137 132*  --   K 3.4* 4.0 2.7* 3.2*  --   CL 95* 93* 93* 92*  --   CO2 40* 30 35* 32  --   GLUCOSE 86 85 122* 104*  --   BUN 22 22 23  29*  --   CREATININE 0.72 0.76 0.79 0.90 0.83  CALCIUM 8.3* 8.4 8.4 7.7*  --   MG 1.7 1.9  --  1.6  --     Liver Function Tests:  Recent Labs Lab 05/31/14 0050 06/05/14 0445  AST 29 12  ALT 13 9  ALKPHOS 57 55  BILITOT 1.2 0.8  PROT 5.9* 5.2*  ALBUMIN 2.9* 2.3*    CBC:  Recent Labs Lab 06/02/14 0414 06/03/14 0630 06/05/14 0445  WBC 8.3 19.0* 24.0*  HGB 12.3 13.9 14.0  HCT 38.8 44.0 43.6  MCV 99.2 99.8 97.5  PLT 233 238 308    Cardiac Enzymes:  Recent Labs Lab 05/30/14 2040  05/31/14 0050 05/31/14 0547 05/31/14 1230  CKTOTAL 147  --   --   --   --   TROPONINI  --   < > 0.26* 0.21* 0.22*  < > = values in this  interval not displayed.   Imaging: CHEST 2 VIEW  COMPARISON: One-view chest 06/03/2014  FINDINGS: The heart is enlarged. Interstitial disease is slightly improved. Aeration is improved at the right lung base. Mild bibasilar airspace disease persists. Bilateral pleural effusions persist. Atherosclerotic calcifications are noted at the aortic arch. The visualized soft tissues and bony thorax are unremarkable.  IMPRESSION: 1. Improving interstitial disease, likely reduced edema. 2. Persistent moderate bilateral pleural effusions, left greater than right. 3. Persistent bibasilar airspace disease. While this likely represents atelectasis, infection is also considered.   Medications:   Scheduled Medications: . acidophilus  1 capsule Oral BID  . antiseptic oral rinse  7 mL Mouth Rinse BID  . aspirin EC  325 mg Oral Daily  . calcitonin (salmon)  1 spray Alternating Nares Daily  . clindamycin  300 mg Oral 3 times per day  . Difluprednate  1 drop Both Eyes BID  . digoxin  0.125 mg Oral Daily  . enoxaparin (LOVENOX) injection  40 mg Subcutaneous Q24H  . fluticasone  1  spray Each Nare Daily  . hydrALAZINE  50 mg Oral 3 times per day  . metoprolol  50 mg Oral BID  . multivitamin with minerals  1 tablet Oral Daily  . PARoxetine  20 mg Oral Daily  . potassium chloride  40 mEq Oral TID  . sodium chloride  3 mL Intravenous Q12H  . sodium chloride  3 mL Intravenous Q12H  . vancomycin  125 mg Oral QID     PRN Medications: acetaminophen **OR** acetaminophen, hydrALAZINE, Influenza vac split quadrivalent PF, ondansetron **OR** ondansetron (ZOFRAN) IV   Assessment:   1. Acute on chronic diastolic heart failure. LVEF 65-70% as of October 2015. Intake and output not well defined last few days. Weight stable. Net output greater than in since admission. Lasix held by primary team with concurrent diarrhea.  2. Elevated troponin I, peak only 0.26, likely secondary to strain with CHF,  not clear ACS.  3. Chronic atrial fibrillation, on lopressor and ASA (not anticoagulated with falls).  4. Leukocytosis in the setting of pyelobephritis, aspiration pneumonia, and C. Difficile positive diarrhea - per primary team.   Plan/Discussion:    Continue ASA, lopressor, digoxin, hydralazine. Would resume Lasix with moderate bilateral pleural effusions to further optimize volume status - will start back at 40 mg daily with potassium supplements and adjust from there.   Satira Sark, M.D., F.A.C.C.

## 2014-06-07 LAB — CBC WITH DIFFERENTIAL/PLATELET
BASOS ABS: 0 10*3/uL (ref 0.0–0.1)
Basophils Relative: 0 % (ref 0–1)
EOS ABS: 0.2 10*3/uL (ref 0.0–0.7)
EOS PCT: 1 % (ref 0–5)
HEMATOCRIT: 42.8 % (ref 36.0–46.0)
Hemoglobin: 13.8 g/dL (ref 12.0–15.0)
Lymphocytes Relative: 6 % — ABNORMAL LOW (ref 12–46)
Lymphs Abs: 0.9 10*3/uL (ref 0.7–4.0)
MCH: 32 pg (ref 26.0–34.0)
MCHC: 32.2 g/dL (ref 30.0–36.0)
MCV: 99.3 fL (ref 78.0–100.0)
Monocytes Absolute: 1.1 10*3/uL — ABNORMAL HIGH (ref 0.1–1.0)
Monocytes Relative: 7 % (ref 3–12)
NEUTROS ABS: 13.8 10*3/uL — AB (ref 1.7–7.7)
Neutrophils Relative %: 86 % — ABNORMAL HIGH (ref 43–77)
Platelets: 352 10*3/uL (ref 150–400)
RBC: 4.31 MIL/uL (ref 3.87–5.11)
RDW: 14.6 % (ref 11.5–15.5)
WBC: 16 10*3/uL — AB (ref 4.0–10.5)

## 2014-06-07 LAB — BASIC METABOLIC PANEL
ANION GAP: 2 — AB (ref 5–15)
BUN: 26 mg/dL — ABNORMAL HIGH (ref 6–23)
CALCIUM: 8.1 mg/dL — AB (ref 8.4–10.5)
CO2: 34 mmol/L — ABNORMAL HIGH (ref 19–32)
Chloride: 99 mEq/L (ref 96–112)
Creatinine, Ser: 0.75 mg/dL (ref 0.50–1.10)
GFR calc non Af Amer: 70 mL/min — ABNORMAL LOW (ref >90)
GFR, EST AFRICAN AMERICAN: 81 mL/min — AB (ref >90)
Glucose, Bld: 98 mg/dL (ref 70–99)
Potassium: 4.4 mmol/L (ref 3.5–5.1)
Sodium: 135 mmol/L (ref 135–145)

## 2014-06-07 LAB — GLUCOSE, CAPILLARY: Glucose-Capillary: 80 mg/dL (ref 70–99)

## 2014-06-07 MED ORDER — FUROSEMIDE 40 MG PO TABS
60.0000 mg | ORAL_TABLET | Freq: Every day | ORAL | Status: DC
  Administered 2014-06-07 – 2014-06-09 (×3): 60 mg via ORAL
  Filled 2014-06-07 (×6): qty 1

## 2014-06-07 NOTE — Progress Notes (Signed)
Primary cardiologist: Dr. Mertie Moores  Seen for followup: Acute on chronic diastolic heart failure  Subjective:    No specific complaints.  Objective:   Temp:  [98 F (36.7 C)-98.5 F (36.9 C)] 98.5 F (36.9 C) (01/10 0542) Pulse Rate:  [61-87] 61 (01/10 0542) Resp:  [20] 20 (01/10 0542) BP: (135-157)/(61-76) 157/61 mmHg (01/10 0542) SpO2:  [94 %-100 %] 100 % (01/10 0542) Weight:  [133 lb 9.6 oz (60.6 kg)] 133 lb 9.6 oz (60.6 kg) (01/10 0500) Last BM Date: 06/06/14  Filed Weights   06/04/14 0547 06/05/14 0530 06/07/14 0500  Weight: 131 lb 13.4 oz (59.8 kg) 132 lb 4.4 oz (60 kg) 133 lb 9.6 oz (60.6 kg)    Intake/Output Summary (Last 24 hours) at 06/07/14 5784 Last data filed at 06/06/14 1700  Gross per 24 hour  Intake    420 ml  Output      0 ml  Net    420 ml    Telemetry: Rate controlled atrial fibrillation, burst of NSVT.  Exam:  General: No distress.  Lungs: Decreased breath sounds bases to mid lung zones.  Cardiac: Irregularly irregular without gallop.  Abdomen: NABS.  Extremities: Improving edema.  Lab Results:  Basic Metabolic Panel:  Recent Labs Lab 06/02/14 0414 06/03/14 0419 06/04/14 0412 06/05/14 0445 06/06/14 0440 06/07/14 0534  NA 140 133* 137 132*  --  135  K 3.4* 4.0 2.7* 3.2*  --  4.4  CL 95* 93* 93* 92*  --  99  CO2 40* 30 35* 32  --  34*  GLUCOSE 86 85 122* 104*  --  98  BUN 22 22 23  29*  --  26*  CREATININE 0.72 0.76 0.79 0.90 0.83 0.75  CALCIUM 8.3* 8.4 8.4 7.7*  --  8.1*  MG 1.7 1.9  --  1.6  --   --     Liver Function Tests:  Recent Labs Lab 06/05/14 0445  AST 12  ALT 9  ALKPHOS 55  BILITOT 0.8  PROT 5.2*  ALBUMIN 2.3*    CBC:  Recent Labs Lab 06/03/14 0630 06/05/14 0445 06/07/14 0534  WBC 19.0* 24.0* 16.0*  HGB 13.9 14.0 13.8  HCT 44.0 43.6 42.8  MCV 99.8 97.5 99.3  PLT 238 308 352    Imaging: CHEST 2 VIEW  COMPARISON: One-view chest 06/03/2014  FINDINGS: The heart is enlarged.  Interstitial disease is slightly improved. Aeration is improved at the right lung base. Mild bibasilar airspace disease persists. Bilateral pleural effusions persist. Atherosclerotic calcifications are noted at the aortic arch. The visualized soft tissues and bony thorax are unremarkable.  IMPRESSION: 1. Improving interstitial disease, likely reduced edema. 2. Persistent moderate bilateral pleural effusions, left greater than right. 3. Persistent bibasilar airspace disease. While this likely represents atelectasis, infection is also considered.   Medications:   Scheduled Medications: . acidophilus  1 capsule Oral BID  . antiseptic oral rinse  7 mL Mouth Rinse BID  . aspirin EC  325 mg Oral Daily  . calcitonin (salmon)  1 spray Alternating Nares Daily  . clindamycin  300 mg Oral 3 times per day  . digoxin  0.125 mg Oral Daily  . enoxaparin (LOVENOX) injection  40 mg Subcutaneous Q24H  . fluticasone  1 spray Each Nare Daily  . furosemide  40 mg Oral Daily  . hydrALAZINE  50 mg Oral 3 times per day  . metoprolol  50 mg Oral BID  . multivitamin with minerals  1 tablet  Oral Daily  . PARoxetine  20 mg Oral Daily  . potassium chloride  40 mEq Oral TID  . sodium chloride  3 mL Intravenous Q12H  . sodium chloride  3 mL Intravenous Q12H  . vancomycin  125 mg Oral QID     PRN Medications: acetaminophen **OR** acetaminophen, hydrALAZINE, Influenza vac split quadrivalent PF, ondansetron **OR** ondansetron (ZOFRAN) IV   Assessment:   1. Acute on chronic diastolic heart failure. LVEF 65-70% as of October 2015. Intake and output not well defined last few days. Weight going up. Lasix started back yesterday.  2. Elevated troponin I, peak only 0.26, likely secondary to strain with CHF, not clear ACS.  3. Chronic atrial fibrillation, on lopressor and ASA (not anticoagulated with falls).  4. Leukocytosis in the setting of pyelobephritis, aspiration pneumonia, and C. Difficile positive  diarrhea - per primary team.   Plan/Discussion:    Continue ASA, lopressor, digoxin, hydralazine. Will increase Lasix to 60 mg daily which was outpatient dose. Followup BMET in AM.   Satira Sark, M.D., F.A.C.C.

## 2014-06-07 NOTE — Progress Notes (Signed)
[facility] PROGRESS NOTE  Jean Murphy JKK:[id number] DOB: 07-30-1917 DOA: 05/30/2014 PCP: Antony Blackbird, MD  81 y/o ? known Diastolic CHF on home O2, Chronic Afib not on AC 2/2 to falls/risk catastrophic bleed, AAA, Htn, Prior CVA [undocumented] who was admitted 05/30/14 with fall without # but ? Pyelo and decompnesated chf and Htn urgency.  Cardiology consulted Found to be clinically aspirating and subsequently was changed from Roccephin to Clindamycin on 1/6 unfortuantely had diarrhea and Cdiff found to be + P CXR from 1/6 atelectasis>PNA, and on CXR 06/05/14 PNA was entertained as a diagnosis so she was concurrently Rx for both Aspiration and Cdiff Her mentation declined over course of acute illness but started to get better   Assessment/Plan: #1 acute on chronic diastolic heart failure Patient is presented with a fall and shortness of breath. Physical exam with crackles noted on pulmonary exam on hospital day 1. Clinical improvement. Pro BNP is elevated on admission. Chest x-ray consistent with a mild CHF on admission. Cardiac enzymes slightly elevated.  2-D echo on 02/26/2014 with a EF of 65-70% with no wall motion abnormalities.. Moderate pulmonary hypertension. Strict I's and O's. Daily weights. I/O = -5.22 L. IV Lasix  to PO 60 mg daily per Cardiology 1/10  Multifactorial early sepsis secondary to suspect of pyelonephritis, urine culture -1/4 /Aspiration pneumonia/C. difficile colitis- initially on Rocephin  clindamycin 1/7 2/2 aspiration [ as allergies to penicillin] Developed C. difficile colitis 1/7 Will treat patient with about 10 days of clindamycin by mouth and double cover with vancomycin by mouth as well. 1 stool on 1/10? Start acidophilus as well  Toxic metabolic encephalopathy secondary to sepsis, question of Lewy body disease- secondary to above issues  Long discussion with daughter 1/7 states she is in a normal state of health plays cards and is fully functional at  baseline Mental status slowly improving 1/9 but expect will take time to fully recover   profound hypokalemia-secondary to diarrhea ,  replacing with by mouth  Kdur 40 tid Rpt labs am 1/10  #3 Fall PT/OT rec SNF-Reassess on Monday 06/08/13 again as mentation and stools are clearing  #4 elevated troponin. It is elevated troponin ? secondary to right ventricle a strain. Cardiology following and appreciate input and recommendations.  #5 atrial fibrillation Patient with chronic falls, and a such not an anticoagulation candidate Continue Lopressor 50 bid and digoxin 0.125 for rate control.  #6 hypertension Stable. Continue Lopressor 50 bid, lasix and digoxin.  #7 diabetes mellitus  Hgb A1c = 5.9.  Discontinue SSI [she is 89-risks of hypoglycemia outweigh benefit of tight glycemic control]  #9 history of abdominal aortic aneurysm Stable. Asymptomatic. Outpatient follow-up.  #10 prophylaxis Lovenox for DVT prophylaxis.   DNAR Discussed c daughter [name] who is a nurse Inpatient pending resolution    Consultants:  Cardiology: Dr. Acie Fredrickson 05/31/2014  Procedures:  CT head without contrast 05/30/2014  Chest x-ray 05/30/2014  X-ray of the L-spine 01//2016  Antibiotics:  IV Rocephin 05/31/2013  HPI/Subjective:  1 stool today Confused, but pleasant Appetite increased  Objective: Filed Vitals:   06/07/14 1447  BP: 150/62  Pulse: 66  Temp: 98.2 F (36.8 C)  Resp: 18    Intake/Output Summary (Last 24 hours) at 06/07/14 1800 Last data filed at 06/07/14 1100  Gross per 24 hour  Intake     50 ml  Output      0 ml  Net     50 ml   Filed Weights   06/04/14  7824 06/05/14 0530 06/07/14 0500  Weight: 59.8 kg (131 lb 13.4 oz) 60 kg (132 lb 4.4 oz) 60.6 kg (133 lb 9.6 oz)    Exam:   General:  NAD  Cardiovascular: RRR  Respiratory: Decreased BS in bases. Crackles L lower chest  Abdomen: Soft, nontender, nondistended, positive bowel sounds.  SKin-stage 1  breakdown in natal cleft 1/9   Data Reviewed: Basic Metabolic Panel:  Recent Labs Lab 06/01/14 0439 06/02/14 0414 06/03/14 0419 06/04/14 0412 06/05/14 0445 06/06/14 0440 06/07/14 0534  NA 141 140 133* 137 132*  --  135  K 3.3* 3.4* 4.0 2.7* 3.2*  --  4.4  CL 96 95* 93* 93* 92*  --  99  CO2 38* 40* 30 35* 32  --  34*  GLUCOSE 86 86 85 122* 104*  --  98  BUN 24* 22 22 23  29*  --  26*  CREATININE 0.67 0.72 0.76 0.79 0.90 0.83 0.75  CALCIUM 8.0* 8.3* 8.4 8.4 7.7*  --  8.1*  MG 1.5 1.7 1.9  --  1.6  --   --    Liver Function Tests:  Recent Labs Lab 06/05/14 0445  AST 12  ALT 9  ALKPHOS 55  BILITOT 0.8  PROT 5.2*  ALBUMIN 2.3*   No results for input(s): LIPASE, AMYLASE in the last 168 hours. No results for input(s): AMMONIA in the last 168 hours. CBC:  Recent Labs Lab 06/01/14 0439 06/02/14 0414 06/03/14 0630 06/05/14 0445 06/07/14 0534  WBC 8.4 8.3 19.0* 24.0* 16.0*  NEUTROABS  --   --   --   --  13.8*  HGB 12.3 12.3 13.9 14.0 13.8  HCT 38.3 38.8 44.0 43.6 42.8  MCV 99.7 99.2 99.8 97.5 99.3  PLT 233 233 238 308 352   Cardiac Enzymes: No results for input(s): CKTOTAL, CKMB, CKMBINDEX, TROPONINI in the last 168 hours. BNP (last 3 results) No results for input(s): PROBNP in the last 8760 hours. CBG:  Recent Labs Lab 06/03/14 2137 06/04/14 0739 06/05/14 0737 06/06/14 0804 06/07/14 0754  GLUCAP 116* 105* 95 101* 80    Recent Results (from the past 240 hour(s))  MRSA PCR Screening     Status: None   Collection Time: 05/31/14  3:30 AM  Result Value Ref Range Status   MRSA by PCR NEGATIVE NEGATIVE Final    Comment:        The GeneXpert MRSA Assay (FDA approved for NASAL specimens only), is one component of a comprehensive MRSA colonization surveillance program. It is not intended to diagnose MRSA infection nor to guide or monitor treatment for MRSA infections.   Culture, blood (routine x 2)     Status: None   Collection Time: 05/31/14  9:43  AM  Result Value Ref Range Status   Specimen Description BLOOD RIGHT ARM  Final   Special Requests   Final    BOTTLES DRAWN AEROBIC AND ANAEROBIC 10CC BOTH BOTTLES   Culture   Final    NO GROWTH 5 DAYS Performed at [facility]    Report Status 06/06/2014 FINAL  Final  Culture, blood (routine x 2)     Status: None   Collection Time: 05/31/14 12:30 PM  Result Value Ref Range Status   Specimen Description BLOOD RIGHT ARM  Final   Special Requests   Final    BOTTLES DRAWN AEROBIC AND ANAEROBIC 10CC BOTH BOTTLES   Culture   Final    NO GROWTH 5 DAYS Performed at [facility]    Report Status 06/06/2014 FINAL  Final  Culture, Urine     Status: None   Collection Time: 06/01/14  3:16 PM  Result Value Ref Range Status   Specimen Description URINE, CATHETERIZED  Final   Special Requests NONE  Final   Colony Count NO GROWTH Performed at [facility]   Final   Culture NO GROWTH Performed at [facility]   Final   Report Status 06/03/2014 FINAL  Final  Clostridium Difficile by PCR     Status: Abnormal   Collection Time: 06/04/14  5:51 AM  Result Value Ref Range Status   C difficile by pcr POSITIVE (A) NEGATIVE Final    Comment: CRITICAL RESULT CALLED TO, READ BACK BY AND VERIFIED WITH: Norberta Keens RN 11:35 06/04/14 ([email or web address]) Performed at [facility]      Studies: No results found.  Scheduled Meds: . acidophilus  1 capsule Oral BID  . antiseptic oral rinse  7 mL Mouth Rinse BID  . aspirin EC  325 mg Oral Daily  . calcitonin (salmon)  1 spray Alternating Nares Daily  . clindamycin  300 mg Oral 3 times per day  . digoxin  0.125 mg Oral Daily  . enoxaparin (LOVENOX) injection  40 mg Subcutaneous Q24H  . fluticasone  1 spray Each Nare Daily  . furosemide  60 mg Oral Daily  . hydrALAZINE  50 mg Oral 3 times per day  . metoprolol  50 mg Oral BID  . multivitamin with minerals  1 tablet Oral Daily  . PARoxetine  20 mg Oral Daily  .  potassium chloride  40 mEq Oral TID  . sodium chloride  3 mL Intravenous Q12H  . sodium chloride  3 mL Intravenous Q12H  . vancomycin  125 mg Oral QID   Continuous Infusions:   Principal Problem:   Acute on chronic diastolic heart failure Active Problems:   Fall   Elevated troponin   UTI (lower urinary tract infection)   Uncontrolled hypertension   UTI (urinary tract infection)    Time spent: 15 minutes    Verneita Griffes, MD [facility] (P) [phone]

## 2014-06-07 NOTE — Progress Notes (Signed)
Have attempted to get pt to get OOB to chair, but pt is confused and gets agitated each time we try, states, "I just want to rest" and falls back to sleep.  No participation on her part to stand and get up.

## 2014-06-08 DIAGNOSIS — I482 Chronic atrial fibrillation: Secondary | ICD-10-CM

## 2014-06-08 LAB — CBC WITH DIFFERENTIAL/PLATELET
BASOS ABS: 0 10*3/uL (ref 0.0–0.1)
Basophils Relative: 0 % (ref 0–1)
EOS ABS: 0.1 10*3/uL (ref 0.0–0.7)
EOS PCT: 1 % (ref 0–5)
HCT: 42.3 % (ref 36.0–46.0)
Hemoglobin: 13.8 g/dL (ref 12.0–15.0)
Lymphocytes Relative: 8 % — ABNORMAL LOW (ref 12–46)
Lymphs Abs: 1.2 10*3/uL (ref 0.7–4.0)
MCH: 32 pg (ref 26.0–34.0)
MCHC: 32.6 g/dL (ref 30.0–36.0)
MCV: 98.1 fL (ref 78.0–100.0)
MONO ABS: 1.3 10*3/uL — AB (ref 0.1–1.0)
MONOS PCT: 9 % (ref 3–12)
NEUTROS ABS: 12.4 10*3/uL — AB (ref 1.7–7.7)
Neutrophils Relative %: 82 % — ABNORMAL HIGH (ref 43–77)
Platelets: 317 10*3/uL (ref 150–400)
RBC: 4.31 MIL/uL (ref 3.87–5.11)
RDW: 14.5 % (ref 11.5–15.5)
WBC: 15 10*3/uL — AB (ref 4.0–10.5)

## 2014-06-08 LAB — BASIC METABOLIC PANEL
ANION GAP: 6 (ref 5–15)
BUN: 20 mg/dL (ref 6–23)
CO2: 32 mmol/L (ref 19–32)
CREATININE: 0.63 mg/dL (ref 0.50–1.10)
Calcium: 8.4 mg/dL (ref 8.4–10.5)
Chloride: 101 mEq/L (ref 96–112)
GFR calc non Af Amer: 74 mL/min — ABNORMAL LOW (ref >90)
GFR, EST AFRICAN AMERICAN: 86 mL/min — AB (ref >90)
GLUCOSE: 96 mg/dL (ref 70–99)
Potassium: 5.1 mmol/L (ref 3.5–5.1)
Sodium: 139 mmol/L (ref 135–145)

## 2014-06-08 LAB — GLUCOSE, CAPILLARY: Glucose-Capillary: 78 mg/dL (ref 70–99)

## 2014-06-08 MED ORDER — COLCHICINE 0.6 MG PO TABS
0.6000 mg | ORAL_TABLET | Freq: Every day | ORAL | Status: DC
  Administered 2014-06-08 – 2014-06-09 (×2): 0.6 mg via ORAL
  Filled 2014-06-08 (×2): qty 1

## 2014-06-08 NOTE — Progress Notes (Signed)
Occupational Therapy Treatment Patient Details Name: Jean Murphy MRN: [id number] DOB: [birth year] Today's Date: 06/08/2014    History of present illness 81 yo female admitted with fall. Hx of chronic Afib, CHF, CVA, AAA, HTN, DM. compresion fxs (T10, L2).    OT comments  Pt requiring much increased time for all tasks. Mod assist to pivot around to [facility] and min assist for hygiene after loose BM. Sats on 2L O2 during activity 85% and up to mid 90s with rest. Pt complaining of L ankle being sore with weight bearing today. Informed nursing who was aware. Will follow.   Follow Up Recommendations  SNF;Supervision/Assistance - 24 hour    Equipment Recommendations  3 in 1 bedside comode    Recommendations for Other Services      Precautions / Restrictions Precautions Precautions: Fall Precaution Comments: O2 dependent       Mobility Bed Mobility Overal bed mobility: Needs Assistance Bed Mobility: Supine to Sit     Supine to sit: Min guard     General bed mobility comments: increased time. verbal cues for technique to self assist.   Transfers Overall transfer level: Needs assistance Equipment used: None Transfers: Sit to/from [facility] Sit to Stand: Mod assist Stand pivot transfers: Mod assist       General transfer comment: verbal cues for hand placement and technique. pt with forward flexed posture.     Balance                                   ADL       Grooming: Wash/dry hands;Set up;Sitting                   Toilet Transfer: Stand-pivot;Moderate assistance   Toileting- Clothing Manipulation and Hygiene: Minimal assistance;Sitting/lateral lean Toileting - Clothing Manipulation Details (indicate cue type and reason): min assist for thoroughness after loose BM. pt leaned forward on BSC to wipe posterior periarea with washcloth. note pt's back is alittle red so encouraged pt to sit up in chair at least for a little while for  pressure relief. pt complaining of L ankle feeling tender with weight bearing. Informed nursing. Pt with very forward flexed posture.               Vision                     Perception     Praxis      Cognition   Behavior During Therapy: [facility] for tasks assessed/performed Overall Cognitive Status: Impaired/Different from baseline Area of Impairment: Safety/judgement;Problem solving Orientation Level: Time            Problem Solving: Requires verbal cues;Requires tactile cues      Extremity/Trunk Assessment               Exercises     Shoulder Instructions       General Comments      Pertinent Vitals/ Pain       Pain Assessment: No/denies pain Pain Score: 2  Pain Location: L ankle Pain Descriptors / Indicators: Sore Pain Intervention(s): Other (comment);Repositioned (informed nursing)  Home Living                                          Prior Functioning/Environment  Frequency Min 2X/week     Progress Toward Goals  OT Goals(current goals can now be found in the care plan section)  Progress towards OT goals:  (weaker than at last session)     Plan Discharge plan remains appropriate    Co-evaluation                 End of Session Equipment Utilized During Treatment: Gait belt   Activity Tolerance Patient tolerated treatment well   Patient Left in chair;with call bell/phone within reach;with chair alarm set   Nurse Communication          Time: 1047-1130 OT Time Calculation (min): 43 min  Charges: OT General Charges $OT Visit: 1 Procedure OT Treatments $Self Care/Home Management : 8-22 mins $Therapeutic Activity: 23-37 mins  Jules Schick  [phone] 06/08/2014, 11:58 AM

## 2014-06-08 NOTE — Progress Notes (Signed)
Subjective:  No c/o CP/SOB, on O2  Objective:  Temp:  [98 F (36.7 C)-98.2 F (36.8 C)] 98 F (36.7 C) (01/11 0554) Pulse Rate:  [66-97] 71 (01/11 0554) Resp:  [18] 18 (01/11 0554) BP: (150-182)/(50-95) 165/50 mmHg (01/11 0554) SpO2:  [96 %-100 %] 96 % (01/11 0554) Weight:  [134 lb 0.6 oz (60.8 kg)] 134 lb 0.6 oz (60.8 kg) (01/11 0500) Weight change: 7.1 oz (0.2 kg)  Intake/Output from previous day: 01/10 0701 - 01/11 0700 In: 50 [P.O.:50] Out: -   Intake/Output from this shift:    Physical Exam: General appearance: alert and no distress Neck: no adenopathy, no carotid bruit, no JVD, supple, symmetrical, trachea midline and thyroid not enlarged, symmetric, no tenderness/mass/nodules Lungs: clear to auscultation bilaterally Heart: irregularly irregular rhythm Extremities: extremities normal, atraumatic, no cyanosis or edema  Lab Results: Results for orders placed or performed during the hospital encounter of 05/30/14 (from the past 48 hour(s))  Basic metabolic panel     Status: Abnormal   Collection Time: 06/07/14  5:34 AM  Result Value Ref Range   Sodium 135 135 - 145 mmol/L    Comment: Please note change in reference range.   Potassium 4.4 3.5 - 5.1 mmol/L    Comment: Please note change in reference range.   Chloride 99 96 - 112 mEq/L   CO2 34 (H) 19 - 32 mmol/L   Glucose, Bld 98 70 - 99 mg/dL   BUN 26 (H) 6 - 23 mg/dL   Creatinine, Ser 0.75 0.50 - 1.10 mg/dL   Calcium 8.1 (L) 8.4 - 10.5 mg/dL   GFR calc non Af Amer 70 (L) >90 mL/min   GFR calc Af Amer 81 (L) >90 mL/min    Comment: (NOTE) The eGFR has been calculated using the CKD EPI equation. This calculation has not been validated in all clinical situations. eGFR's persistently <90 mL/min signify possible Chronic Kidney Disease.    Anion gap 2 (L) 5 - 15    Comment: CONSISTENT WITH PREVIOUS RESULT  CBC with Differential     Status: Abnormal   Collection Time: 06/07/14  5:34 AM  Result Value Ref  Range   WBC 16.0 (H) 4.0 - 10.5 K/uL   RBC 4.31 3.87 - 5.11 MIL/uL   Hemoglobin 13.8 12.0 - 15.0 g/dL   HCT 42.8 36.0 - 46.0 %   MCV 99.3 78.0 - 100.0 fL   MCH 32.0 26.0 - 34.0 pg   MCHC 32.2 30.0 - 36.0 g/dL   RDW 14.6 11.5 - 15.5 %   Platelets 352 150 - 400 K/uL   Neutrophils Relative % 86 (H) 43 - 77 %   Neutro Abs 13.8 (H) 1.7 - 7.7 K/uL   Lymphocytes Relative 6 (L) 12 - 46 %   Lymphs Abs 0.9 0.7 - 4.0 K/uL   Monocytes Relative 7 3 - 12 %   Monocytes Absolute 1.1 (H) 0.1 - 1.0 K/uL   Eosinophils Relative 1 0 - 5 %   Eosinophils Absolute 0.2 0.0 - 0.7 K/uL   Basophils Relative 0 0 - 1 %   Basophils Absolute 0.0 0.0 - 0.1 K/uL  Glucose, capillary     Status: None   Collection Time: 06/07/14  7:54 AM  Result Value Ref Range   Glucose-Capillary 80 70 - 99 mg/dL  Basic metabolic panel     Status: Abnormal   Collection Time: 06/08/14  4:08 AM  Result Value Ref Range   Sodium 139  135 - 145 mmol/L    Comment: Please note change in reference range.   Potassium 5.1 3.5 - 5.1 mmol/L    Comment: Please note change in reference range.   Chloride 101 96 - 112 mEq/L   CO2 32 19 - 32 mmol/L   Glucose, Bld 96 70 - 99 mg/dL   BUN 20 6 - 23 mg/dL   Creatinine, Ser 0.63 0.50 - 1.10 mg/dL   Calcium 8.4 8.4 - 10.5 mg/dL   GFR calc non Af Amer 74 (L) >90 mL/min   GFR calc Af Amer 86 (L) >90 mL/min    Comment: (NOTE) The eGFR has been calculated using the CKD EPI equation. This calculation has not been validated in all clinical situations. eGFR's persistently <90 mL/min signify possible Chronic Kidney Disease.    Anion gap 6 5 - 15  CBC with Differential     Status: Abnormal   Collection Time: 06/08/14  4:08 AM  Result Value Ref Range   WBC 15.0 (H) 4.0 - 10.5 K/uL   RBC 4.31 3.87 - 5.11 MIL/uL   Hemoglobin 13.8 12.0 - 15.0 g/dL   HCT 42.3 36.0 - 46.0 %   MCV 98.1 78.0 - 100.0 fL   MCH 32.0 26.0 - 34.0 pg   MCHC 32.6 30.0 - 36.0 g/dL   RDW 14.5 11.5 - 15.5 %   Platelets 317 150 -  400 K/uL   Neutrophils Relative % 82 (H) 43 - 77 %   Neutro Abs 12.4 (H) 1.7 - 7.7 K/uL   Lymphocytes Relative 8 (L) 12 - 46 %   Lymphs Abs 1.2 0.7 - 4.0 K/uL   Monocytes Relative 9 3 - 12 %   Monocytes Absolute 1.3 (H) 0.1 - 1.0 K/uL   Eosinophils Relative 1 0 - 5 %   Eosinophils Absolute 0.1 0.0 - 0.7 K/uL   Basophils Relative 0 0 - 1 %   Basophils Absolute 0.0 0.0 - 0.1 K/uL  Glucose, capillary     Status: None   Collection Time: 06/08/14  7:52 AM  Result Value Ref Range   Glucose-Capillary 78 70 - 99 mg/dL    Imaging: Imaging results have been reviewed  Tele- Afib with CVR  Assessment/Plan:   1. Principal Problem: 2.   Acute on chronic diastolic heart failure 3. Active Problems: 4.   Fall 5.   Elevated troponin 6.   UTI (lower urinary tract infection) 7.   Uncontrolled hypertension 8.   UTI (urinary tract infection) 9.   Time Spent Directly with Patient:  15 minutes  Length of Stay:  LOS: 9 days   Pt with chronic diastolic CHF and CAF rate controlled and not on Ethan secondary to fall risk. I/0 - 4L. Good sats on O2. Trop minimally elevated prob secondary to CHF (doubt ACS). Now on home lasix dose (60 mg). Otherwise being Rx for Pyelonephritis and C. Diff. BNP moderatley elevated. Not in clinical CHF (lungs clear, no periph edema). Cont current Rx. Will be avail for further issues as they arise, otherwise F/O with Dr. Cathie Olden as OP after DC.   Lorretta Harp 06/08/2014, 8:13 AM

## 2014-06-08 NOTE — Progress Notes (Signed)
[facility] PROGRESS NOTE  Jean Murphy YWV:[id number] DOB: 04/21/1918 DOA: 05/30/2014 PCP: Antony Blackbird, MD  81 y/o ? known Diastolic CHF on home O2, Chronic Afib not on AC 2/2 to falls/risk catastrophic bleed, AAA, Htn, Prior CVA [undocumented] who was admitted 05/30/14 with fall without # but ? Pyelo and decompnesated chf and Htn urgency.  Cardiology consulted Found to be clinically aspirating and subsequently was changed from Roccephin to Clindamycin on 1/6 unfortuantely had diarrhea and Cdiff found to be + P CXR from 1/6 atelectasis>PNA, and on CXR 06/05/14 PNA was entertained as a diagnosis so she was concurrently Rx for both Aspiration and Cdiff Her mentation declined over course of acute illness but started to get better She developed acute painful left great toe on 1/11 started on colchicine   Assessment/Plan:  Podagra, ?Gout-she has a history of this so we will renally dose her colchicine 0.6 mg.  If no better and if she spikes a fever, I would start her on antibiotics in addition to her clindamycin by mouth for her aspiration  acute on chronic diastolic heart failure Patient is presented with a fall and shortness of breath. Physical exam with crackles noted on pulmonary exam on hospital day 1. Clinical improvement. Pro BNP is elevated on admission. Chest x-ray consistent with a mild CHF on admission. Cardiac enzymes slightly elevated.  2-D echo on 02/26/2014 with a EF of 65-70% with no wall motion abnormalities.. Moderate pulmonary hypertension. Strict I's and O's. Daily weights. I/O = -5.22 L. IV Lasix  to PO 60 mg daily per Cardiology 1/10  Multifactorial early sepsis secondary to suspect of pyelonephritis, urine culture -1/4 /Aspiration pneumonia/C. difficile colitis- initially on Rocephin  clindamycin 1/7 2/2 aspiration [ as allergies to penicillin] Developed C. difficile colitis 1/7 Will treat patient with about 10 days of clindamycin by mouth and double cover with  vancomycin by mouth as well. 1 stool on 1/10? Start acidophilus as well  Toxic metabolic encephalopathy secondary to sepsis, question of Lewy body disease- secondary to above issues  Long discussion with daughter 1/7 states she is in a normal state of health plays cards and is fully functional at baseline Mental status slowly improving 1/9 but expect will take time to fully recover   profound hypokalemia-secondary to diarrhea ,  replacing with by mouth  Kdur 40 tid Rpt labs am 1/10   Fall PT/OT rec SNF-Reassess on Monday 06/08/13 again as mentation and stools are clearing   elevated troponin. It is elevated troponin ? secondary to right ventricle a strain. Cardiology following and appreciate input and recommendations.   atrial fibrillation Patient with chronic falls, and a such not an anticoagulation candidate Continue Lopressor 50 bid and digoxin 0.125 for rate control.   hypertension Stable. Continue Lopressor 50 bid, lasix and digoxin.  diabetes mellitus  Hgb A1c = 5.9.  Discontinue SSI [she is 89-risks of hypoglycemia outweigh benefit of tight glycemic control]   history of abdominal aortic aneurysm Stable. Asymptomatic. Outpatient follow-up.  prophylaxis Lovenox for DVT prophylaxis.   DNAR Discussed c daughter [name] who is a nurse Inpatient pending resolution    Consultants:  Cardiology: Dr. Acie Fredrickson 05/31/2014  Procedures:  CT head without contrast 05/30/2014  Chest x-ray 05/30/2014  X-ray of the L-spine 01//2016  Antibiotics:  IV Rocephin 05/31/2013-1/5  Clindamycin 1/6  Vancomycin 1/7  HPI/Subjective:  1 stool today Still pretty confused No other complaints really   Objective: Filed Vitals:   06/08/14 1442  BP: 115/74  Pulse: 124  Temp: 98.8 F (37.1 C)  Resp: 18    Intake/Output Summary (Last 24 hours) at 06/08/14 1534 Last data filed at 06/08/14 1300  Gross per 24 hour  Intake    360 ml  Output      0 ml  Net    360 ml    Filed Weights   06/05/14 0530 06/07/14 0500 06/08/14 0500  Weight: 60 kg (132 lb 4.4 oz) 60.6 kg (133 lb 9.6 oz) 60.8 kg (134 lb 0.6 oz)    Exam:   General:  NAD  Cardiovascular: RRR  Respiratory: Decreased BS in bases. Crackles L lower chest  Abdomen: Soft, nontender, nondistended, positive bowel sounds.  SKin-stage 1 breakdown in natal cleft 1/9, left great toe seems tender and exquisitely painful  Data Reviewed: Basic Metabolic Panel:  Recent Labs Lab 06/02/14 0414 06/03/14 0419 06/04/14 0412 06/05/14 0445 06/06/14 0440 06/07/14 0534 06/08/14 0408  NA 140 133* 137 132*  --  135 139  K 3.4* 4.0 2.7* 3.2*  --  4.4 5.1  CL 95* 93* 93* 92*  --  99 101  CO2 40* 30 35* 32  --  34* 32  GLUCOSE 86 85 122* 104*  --  98 96  BUN 22 22 23  29*  --  26* 20  CREATININE 0.72 0.76 0.79 0.90 0.83 0.75 0.63  CALCIUM 8.3* 8.4 8.4 7.7*  --  8.1* 8.4  MG 1.7 1.9  --  1.6  --   --   --    Liver Function Tests:  Recent Labs Lab 06/05/14 0445  AST 12  ALT 9  ALKPHOS 55  BILITOT 0.8  PROT 5.2*  ALBUMIN 2.3*   No results for input(s): LIPASE, AMYLASE in the last 168 hours. No results for input(s): AMMONIA in the last 168 hours. CBC:  Recent Labs Lab 06/02/14 0414 06/03/14 0630 06/05/14 0445 06/07/14 0534 06/08/14 0408  WBC 8.3 19.0* 24.0* 16.0* 15.0*  NEUTROABS  --   --   --  13.8* 12.4*  HGB 12.3 13.9 14.0 13.8 13.8  HCT 38.8 44.0 43.6 42.8 42.3  MCV 99.2 99.8 97.5 99.3 98.1  PLT 233 238 308 352 317   Cardiac Enzymes: No results for input(s): CKTOTAL, CKMB, CKMBINDEX, TROPONINI in the last 168 hours. BNP (last 3 results) No results for input(s): PROBNP in the last 8760 hours. CBG:  Recent Labs Lab 06/04/14 0739 06/05/14 0737 06/06/14 0804 06/07/14 0754 06/08/14 0752  GLUCAP 105* 95 101* 80 78    Recent Results (from the past 240 hour(s))  MRSA PCR Screening     Status: None   Collection Time: 05/31/14  3:30 AM  Result Value Ref Range Status    MRSA by PCR NEGATIVE NEGATIVE Final    Comment:        The GeneXpert MRSA Assay (FDA approved for NASAL specimens only), is one component of a comprehensive MRSA colonization surveillance program. It is not intended to diagnose MRSA infection nor to guide or monitor treatment for MRSA infections.   Culture, blood (routine x 2)     Status: None   Collection Time: 05/31/14  9:43 AM  Result Value Ref Range Status   Specimen Description BLOOD RIGHT ARM  Final   Special Requests   Final    BOTTLES DRAWN AEROBIC AND ANAEROBIC 10CC BOTH BOTTLES   Culture   Final    NO GROWTH 5 DAYS Performed at [facility]    Report Status 06/06/2014 FINAL  Final  Culture, blood (routine x 2)     Status: None   Collection Time: 05/31/14 12:30 PM  Result Value Ref Range Status   Specimen Description BLOOD RIGHT ARM  Final   Special Requests   Final    BOTTLES DRAWN AEROBIC AND ANAEROBIC 10CC BOTH BOTTLES   Culture   Final    NO GROWTH 5 DAYS Performed at [facility]    Report Status 06/06/2014 FINAL  Final  Culture, Urine     Status: None   Collection Time: 06/01/14  3:16 PM  Result Value Ref Range Status   Specimen Description URINE, CATHETERIZED  Final   Special Requests NONE  Final   Colony Count NO GROWTH Performed at [facility]   Final   Culture NO GROWTH Performed at [facility]   Final   Report Status 06/03/2014 FINAL  Final  Clostridium Difficile by PCR     Status: Abnormal   Collection Time: 06/04/14  5:51 AM  Result Value Ref Range Status   C difficile by pcr POSITIVE (A) NEGATIVE Final    Comment: CRITICAL RESULT CALLED TO, READ BACK BY AND VERIFIED WITH: Norberta Keens RN 11:35 06/04/14 ([email or web address]) Performed at [facility]      Studies: No results found.  Scheduled Meds: . acidophilus  1 capsule Oral BID  . antiseptic oral rinse  7 mL Mouth Rinse BID  . aspirin EC  325 mg Oral Daily  . calcitonin (salmon)  1 spray Alternating  Nares Daily  . clindamycin  300 mg Oral 3 times per day  . colchicine  0.6 mg Oral Daily  . digoxin  0.125 mg Oral Daily  . enoxaparin (LOVENOX) injection  40 mg Subcutaneous Q24H  . fluticasone  1 spray Each Nare Daily  . furosemide  60 mg Oral Daily  . hydrALAZINE  50 mg Oral 3 times per day  . metoprolol  50 mg Oral BID  . multivitamin with minerals  1 tablet Oral Daily  . PARoxetine  20 mg Oral Daily  . sodium chloride  3 mL Intravenous Q12H  . sodium chloride  3 mL Intravenous Q12H  . vancomycin  125 mg Oral QID   Continuous Infusions:   Principal Problem:   Acute on chronic diastolic heart failure Active Problems:   Fall   Elevated troponin   UTI (lower urinary tract infection)   Uncontrolled hypertension   UTI (urinary tract infection)    Time spent: 25 minutes    Verneita Griffes, MD [facility] (P) [phone]

## 2014-06-08 NOTE — Progress Notes (Signed)
Physical Therapy Treatment Patient Details Name: Jean Murphy MRN: [id number] DOB: 07/09/1917 Today's Date: 06/08/2014    History of Present Illness 81 yo female admitted with fall. Hx of chronic Afib, CHF, CVA, AAA, HTN, DM. compresion fxs (T10, L2).     PT Comments    Pt OOB on recliner via OT.  Attempted amb however pt unable due to MAX c/o L toe pain and inability to weight bear.  Returned to sitting.  Removed sock and noted L great toe to be red, warm, edema and very painful to touch. Reported to NT and text paged MD.  Follow Up Recommendations  SNF     Equipment Recommendations       Recommendations for Other Services       Precautions / Restrictions Precautions Precautions: Fall Precaution Comments: O2 dependent Restrictions Weight Bearing Restrictions: No    Mobility  Bed Mobility Overal bed mobility: Needs Assistance Bed Mobility: Supine to Sit     Supine to sit: Min guard     General bed mobility comments: pt OOB in recliner  Transfers Overall transfer level: Needs assistance Equipment used: Rolling walker (2 wheeled) Transfers: Sit to/from Stand Sit to Stand: Mod assist;Max assist Stand pivot transfers: Mod assist       General transfer comment: required increased time and unable to complete due to MAX c/o pain L foot  Ambulation/Gait             General Gait Details: unable to attempt due to MAX c/o L toe pain and inability to [employer]   Stairs            Wheelchair Mobility    Modified Rankin (Stroke Patients Only)       Balance                                    Cognition Arousal/Alertness: Awake/alert Behavior During Therapy: WFL for tasks assessed/performed Overall Cognitive Status: Impaired/Different from baseline Area of Impairment: Safety/judgement;Problem solving Orientation Level: Time           Problem Solving: Requires verbal cues;Requires tactile cues      Exercises      General Comments         Pertinent Vitals/Pain Pain Assessment: 0-10 Pain Score: 10-Worst pain ever Faces Pain Scale: Hurts whole lot Pain Location: L great toe Pain Descriptors / Indicators: Constant;Sore;Sharp;Nagging Pain Intervention(s): Repositioned;Other (comment) (text paged MD)    Home Living                      Prior Function            PT Goals (current goals can now be found in the care plan section) Progress towards PT goals: Progressing toward goals    Frequency       PT Plan      Co-evaluation             End of Session Equipment Utilized During Treatment: Gait belt;Oxygen Activity Tolerance: Patient limited by pain Patient left: in chair;with call bell/phone within reach     Time: 1204-1222 PT Time Calculation (min) (ACUTE ONLY): 18 min  Charges:  $Therapeutic Activity: 8-22 mins                    G Codes:      Rica Koyanagi  PTA [facility]  Acute  Rehab Pager  [phone]  

## 2014-06-08 NOTE — Progress Notes (Signed)
Speech Language Pathology Treatment: Dysphagia  Patient Details Name: Jean Murphy MRN: [id number] DOB: 1917/12/20 Today's Date: 06/08/2014 Time: 1010-1040 SLP Time Calculation (min) (ACUTE ONLY): 30 min  Assessment / Plan / Recommendation Clinical Impression  Jean Murphy today sitting upright in bed upon SLP arrival.  She reports improvement in swallow ability with chin tuck posture and medicine with applesauce.   She admits she has not been conducting chin tuck with liquids.  SLP reviewed in detail results of MBS study from Friday 06/05/14 and clinical reasoning for compensation strategies.   Pt again confirms she has had difficulties with swallowing/choking more on liquids than solids for at least two years.  Advised her to SLP role of mitigating dysphagia.  Observed pt consuming thin liquid via straw with chin tuck posture.  Pt again stated "That was easier to swallow that way".  Advised her to continue precautions especially while acutely ill resulting in increased pna risk if she aspirates.  Pt did become mildly short of breath with intake and reviewed need for rest break if short of breath.    Recommend follow up at next venue for dysphagia management.  Thanks for allowing me to help care for this pleasant pt.       HPI HPI: 81 y.o. female with history of chronic atrial fibrillation, hypertension, CHF, abdominal aortic aneurysm and compression fracture admitted after fall at ALF. Pt found to have mildly elevated troponin,labs revealed leukocytosis and UA shows possible UTI. CXR 1/6 revealed increased bilateral pleural effusions and basilar airspace disease   Pertinent Vitals Pain Assessment: No/denies pain  SLP Plan  Continue with current plan of care    Recommendations Diet recommendations: Dysphagia 3 (mechanical soft);Thin liquid Medication Administration: Whole meds with puree Supervision: Patient able to self feed;Full supervision/cueing for compensatory strategies;Staff to assist  with self feeding Compensations: Slow rate;Small sips/bites;Follow solids with liquid (intermittent dry swallow) Postural Changes and/or Swallow Maneuvers: Seated upright 90 degrees;Upright 30-60 min after meal;Chin tuck              Oral Care Recommendations: Oral care BID Follow up Recommendations: Skilled Nursing facility;Home health SLP Plan: Continue with current plan of care    [place], [place], [facility] SLP [phone]

## 2014-06-09 LAB — CBC WITH DIFFERENTIAL/PLATELET
Basophils Absolute: 0 10*3/uL (ref 0.0–0.1)
Basophils Relative: 0 % (ref 0–1)
Eosinophils Absolute: 0.1 10*3/uL (ref 0.0–0.7)
Eosinophils Relative: 1 % (ref 0–5)
HEMATOCRIT: 43.2 % (ref 36.0–46.0)
HEMOGLOBIN: 14.1 g/dL (ref 12.0–15.0)
LYMPHS ABS: 0.8 10*3/uL (ref 0.7–4.0)
Lymphocytes Relative: 5 % — ABNORMAL LOW (ref 12–46)
MCH: 31.8 pg (ref 26.0–34.0)
MCHC: 32.6 g/dL (ref 30.0–36.0)
MCV: 97.5 fL (ref 78.0–100.0)
MONOS PCT: 8 % (ref 3–12)
Monocytes Absolute: 1.2 10*3/uL — ABNORMAL HIGH (ref 0.1–1.0)
NEUTROS ABS: 13.3 10*3/uL — AB (ref 1.7–7.7)
NEUTROS PCT: 86 % — AB (ref 43–77)
Platelets: 389 10*3/uL (ref 150–400)
RBC: 4.43 MIL/uL (ref 3.87–5.11)
RDW: 14.2 % (ref 11.5–15.5)
WBC: 15.3 10*3/uL — ABNORMAL HIGH (ref 4.0–10.5)

## 2014-06-09 LAB — BASIC METABOLIC PANEL
Anion gap: 7 (ref 5–15)
BUN: 19 mg/dL (ref 6–23)
CALCIUM: 8.7 mg/dL (ref 8.4–10.5)
CO2: 33 mmol/L — ABNORMAL HIGH (ref 19–32)
CREATININE: 0.7 mg/dL (ref 0.50–1.10)
Chloride: 99 mEq/L (ref 96–112)
GFR calc Af Amer: 83 mL/min — ABNORMAL LOW (ref >90)
GFR calc non Af Amer: 72 mL/min — ABNORMAL LOW (ref >90)
GLUCOSE: 108 mg/dL — AB (ref 70–99)
Potassium: 4.7 mmol/L (ref 3.5–5.1)
SODIUM: 139 mmol/L (ref 135–145)

## 2014-06-09 LAB — GLUCOSE, CAPILLARY: GLUCOSE-CAPILLARY: 100 mg/dL — AB (ref 70–99)

## 2014-06-09 MED ORDER — COLCHICINE 0.6 MG PO TABS: 0.6000 mg | ORAL_TABLET | Freq: Every day | ORAL | Status: DC

## 2014-06-09 MED ORDER — RISAQUAD PO CAPS: 1.0000 | ORAL_CAPSULE | Freq: Two times a day (BID) | ORAL | Status: AC

## 2014-06-09 MED ORDER — CLINDAMYCIN HCL 300 MG PO CAPS: 300.0000 mg | ORAL_CAPSULE | Freq: Three times a day (TID) | ORAL | Status: DC

## 2014-06-09 MED ORDER — VANCOMYCIN 50 MG/ML ORAL SOLUTION: 125.0000 mg | Freq: Four times a day (QID) | ORAL | Status: DC

## 2014-06-09 NOTE — Progress Notes (Signed)
[facility] called for transport.     Kanchan Gal, LCSW [facility] Clinical Social Worker cell #: [phone]  

## 2014-06-09 NOTE — Progress Notes (Signed)
Patient is set to discharge to [facility] today. Patient & daughter, Jean Murphy aware. Discharge packet given to RN, Deidre Ala. [facility] will be called for transport once daughter arrives.   Clinical Social Work Department CLINICAL SOCIAL WORK PLACEMENT NOTE 06/09/2014  Patient:  Jean Murphy, Jean Murphy  Account Number:  [id number] Admit date:  05/30/2014  Clinical Social Worker:  Renold Genta  Date/time:  06/01/2014 03:27 PM  Clinical Social Work is seeking post-discharge placement for this patient at the following level of care:   SKILLED NURSING   (*CSW will update this form in Epic as items are completed)   06/01/2014  Patient/family provided with [facility] Department of Clinical Social Work's list of facilities offering this level of care within the geographic area requested by the patient (or if unable, by the patient's family).  06/01/2014  Patient/family informed of their freedom to choose among providers that offer the needed level of care, that participate in Medicare, Medicaid or managed care program needed by the patient, have an available bed and are willing to accept the patient.  06/01/2014  Patient/family informed of [facility]' ownership interest in [facility], as well as of the fact that they are under no obligation to receive care at this facility.  PASARR submitted to EDS on 06/01/2014 PASARR number received on 06/01/2014  FL2 transmitted to all facilities in geographic area requested by pt/family on  06/01/2014 FL2 transmitted to all facilities within larger geographic area on   Patient informed that his/her managed care company has contracts with or will negotiate with  certain facilities, including the following:   [employer] Medicare/[employer]     Patient/family informed of bed offers received:  06/01/2014 Patient chooses bed at [place] Physician recommends and patient chooses bed at    Patient to be transferred to [facility] on   06/09/2014 Patient to be transferred to facility by [facility] Patient and family notified of transfer on 06/09/2014 Name of family member notified:  patient's daughter, Jean Murphy  The following physician request were entered in Epic:   Additional Comments:    [name], [place] Social Worker cell #: [phone] '

## 2014-06-09 NOTE — Discharge Summary (Signed)
Physician Discharge Summary  Jean Murphy LPF:[id number] DOB: 02-05-1918 DOA: 05/30/2014  PCP: Antony Blackbird, MD  Admit date: 05/30/2014 Discharge date: 06/09/2014  Time spent: 45 minutes  Recommendations for Outpatient Follow-up:  1. Needs clindamycin till 06/11/14 for completion of Aspiration PNA Rx 2. Continue Vancomycin for another 10 days for Cdiff colitis 3.  SNF, [facility] for STR 4. Get cbc + diff, Bmet in 3 days 5. Monitor closely K and Magnesium as well 6. Monitor daily weighta at SNF and if gains >2 lbs 24 hours, inform MD at facility to get order to change LAsix dose  Discharge Diagnoses:  Principal Problem:   Acute on chronic diastolic heart failure Active Problems:   Fall   Elevated troponin   UTI (lower urinary tract infection)   Uncontrolled hypertension   UTI (urinary tract infection)   Discharge Condition: fair  Diet recommendation: dysphagia 3 , Heart healthy thin liquids  Filed Weights   06/07/14 0500 06/08/14 0500 06/09/14 0514  Weight: 60.6 kg (133 lb 9.6 oz) 60.8 kg (134 lb 0.6 oz) 60.7 kg (133 lb 13.1 oz)    History of present illness:  81 y/o ? known Diastolic CHF on home O2, Chronic Afib not on AC 2/2 to falls/risk catastrophic bleed, AAA, Htn, Prior CVA [undocumented] who was admitted 05/30/14 with fall without # but ? Pyelo and decompnesated chf and Htn urgency. Cardiology consulted Found to be clinically aspirating and subsequently was changed from Roccephin to Clindamycin on 1/6 unfortuantely had diarrhea and Cdiff found to be + P CXR from 1/6 atelectasis>PNA, and on CXR 06/05/14 PNA was entertained as a diagnosis so she was concurrently Rx for both Aspiration and Cdiff Her mentation declined over course of acute illness but started to get better She developed acute painful left great toe on 1/11 started on colchicine  Hospital Course:     acute on chronic diastolic heart failure Patient is presented with a fall and shortness of breath.  Physical exam with crackles noted on pulmonary exam on hospital day 1. Clinical improvement. Pro BNP is elevated on admission. Chest x-ray consistent with a mild CHF on admission. Cardiac enzymes slightly elevated. 2-D echo on 02/26/2014 with a EF of 65-70% with no wall motion abnormalities.. Moderate pulmonary hypertension. Strict I's and O's. Daily weights. I/O = -3.38 L Total Out on d/c IV Lasix  to PO 60 mg daily per Cardiology 1/10 Bmet/Mag ion 3-5 days at facility Needs daily weights  Podagra, ?Gout-she has a history of this so we will renally dose her colchicine 0.6 mg.improved  Multifactorial early sepsis secondary to suspect of pyelonephritis, urine culture -1/4 /Aspiration pneumonia/C. difficile colitis- initially on Rocephin  clindamycin 1/7 2/2 aspiration [ as allergies to penicillin] Developed C. difficile colitis 1/7 Will treat patient with about 10 days of clindamycin by mouth and double cover with vancomycin by mouth as well. No further loose stool on 1/10? Continue acidophilus as well  Toxic metabolic encephalopathy secondary to sepsis, question of Lewy body disease- secondary to above issues  Long discussion with daughter 1/7 states she is in a normal state of health plays cards and is fully functional at baseline Mental status slowly improving 1/9 but expect will take time to fully recover  profound hypokalemia-secondary to diarrhea ,  replacing with by mouth  Kdur 40 tid-discontinued on 1/11 as resolved  Fall PT/OT rec SNF-Reassess on Monday 06/08/13 again as mentation and stools are clearing  elevated troponin. It is elevated troponin ? secondary to right  ventricle a strain. Cardiology saw and felt 2/2 to demand ischemia  atrial fibrillation Patient with chronic falls, and a such not an anticoagulation candidate Continue Lopressor 50 bid and digoxin 0.125 for rate control.  hypertension Stable. Continue Lopressor 50 bid, lasix and  digoxin.  diabetes mellitus Hgb A1c = 5.9.  Discontinue SSI [she is 89-risks of hypoglycemia outweigh benefit of tight glycemic control]  history of abdominal aortic aneurysm Stable. Asymptomatic. Outpatient follow-up  Procedures:  Multiple  Consultations:  Cardiology  Discharge Exam: Filed Vitals:   06/09/14 1250  BP: 147/71  Pulse: 68  Temp: 97.8 F (36.6 C)  Resp: 16    General: eomi, confused, pleasant no distress Cardiovascular:  s1 s 2 irreg irreg Respiratory:  Clinically clear  Discharge Instructions   Discharge Instructions    Diet - low sodium heart healthy    Complete by:  As directed      Increase activity slowly    Complete by:  As directed           Current Discharge Medication List    START taking these medications   Details  acidophilus (RISAQUAD) CAPS capsule Take 1 capsule by mouth 2 (two) times daily.    clindamycin (CLEOCIN) 300 MG capsule Take 1 capsule (300 mg total) by mouth every 8 (eight) hours. Qty: 6 capsule, Refills: 0    colchicine 0.6 MG tablet Take 1 tablet (0.6 mg total) by mouth daily.    vancomycin (VANCOCIN) 50 mg/mL oral solution Take 2.5 mLs (125 mg total) by mouth 4 (four) times daily. Qty: 100 mL, Refills: 0      CONTINUE these medications which have NOT CHANGED   Details  acetaminophen (TYLENOL) 500 MG tablet Take 500 mg by mouth every 6 (six) hours as needed for mild pain or headache.     aspirin 81 MG tablet Take 81 mg by mouth daily.    calcitonin, salmon, (MIACALCIN/FORTICAL) 200 UNIT/ACT nasal spray Place 1 spray into alternate nostrils daily.    Difluprednate (DUREZOL) 0.05 % EMUL Place 1 drop into both eyes 2 (two) times daily.     digoxin (LANOXIN) 0.125 MG tablet Take by mouth daily.    fluticasone (FLONASE) 50 MCG/ACT nasal spray Place 1 spray into both nostrils daily.  Refills: 12    furosemide (LASIX) 20 MG tablet Take 20 mg by mouth 3 (three) times daily.     hydrALAZINE (APRESOLINE) 50 MG  tablet Take 50 mg by mouth every 8 (eight) hours.    metoprolol (LOPRESSOR) 50 MG tablet Take 50 mg by mouth 2 (two) times daily.    PARoxetine (PAXIL) 20 MG tablet Take 20 mg by mouth daily.      STOP taking these medications     docusate sodium (COLACE) 100 MG capsule      Multiple Vitamin (MULTIVITAMIN) tablet      Multiple Vitamins-Minerals (PRESERVISION AREDS 2 PO)      potassium chloride 20 MEQ/15ML (10%) solution        Allergies  Allergen Reactions  . Penicillins     unknown  . Shellfish Allergy     Hives        The results of significant diagnostics from this hospitalization (including imaging, microbiology, ancillary and laboratory) are listed below for reference.    Significant Diagnostic Studies: Dg Chest 2 View  06/05/2014   CLINICAL DATA:  Pneumococcal bronchitis.  EXAM: CHEST  2 VIEW  COMPARISON:  One-view chest 06/03/2014  FINDINGS: The heart is enlarged.  Interstitial disease is slightly improved. Aeration is improved at the right lung base. Mild bibasilar airspace disease persists. Bilateral pleural effusions persist. Atherosclerotic calcifications are noted at the aortic arch. The visualized soft tissues and bony thorax are unremarkable.  IMPRESSION: 1. Improving interstitial disease, likely reduced edema. 2. Persistent moderate bilateral pleural effusions, left greater than right. 3. Persistent bibasilar airspace disease. While this likely represents atelectasis, infection is also considered.   Electronically Signed   By: Lawrence Santiago M.D.   On: 06/05/2014 09:09   Dg Chest 2 View  05/30/2014   CLINICAL DATA:  Patient status post fall from assisted living at 5 this afternoon  EXAM: CHEST  2 VIEW  COMPARISON:  January 19, 2014  FINDINGS: The mediastinal contour is normal. The heart size is enlarged. The aorta is tortuous. There is central pulmonary edema. There are small bilateral pleural effusions. There is no focal pneumonia. Degenerative joint changes of  bilateral shoulders are noted.  IMPRESSION: Mild congestive heart failure.  Small bilateral pleural effusions.   Electronically Signed   By: Abelardo Diesel M.D.   On: 05/30/2014 21:34   Dg Lumbar Spine Complete  05/30/2014   CLINICAL DATA:  Status post fall and nursing home 5 o'clock today.  EXAM: LUMBAR SPINE - COMPLETE 4+ VIEW  COMPARISON:  None.  FINDINGS: There are degenerative joint changes noted throughout lumbar spine. There is scoliosis of spine. There are compression deformities of L2, L1, T12, T11, T10, more prominently involving L2. These compression deformities are probably chronic but evaluation is limited without old films for comparison.  IMPRESSION: Scoliosis and degenerative joint changes throughout the spine. There are compression deformities of T10 through L2, probably chronic but assessment of chronicity is limited without old films for comparison. If the patient has focal pain at L2, acute component is not excluded.   Electronically Signed   By: Abelardo Diesel M.D.   On: 05/30/2014 21:37   Ct Head Wo Contrast  05/30/2014   CLINICAL DATA:  Initial encounter for Fall today and found on floor beside bed about 5 p.m. today.  EXAM: CT HEAD WITHOUT CONTRAST  TECHNIQUE: Contiguous axial images were obtained from the base of the skull through the vertex without intravenous contrast.  COMPARISON:  None.  FINDINGS: There is no evidence for acute hemorrhage, hydrocephalus, mass lesion, or abnormal extra-axial fluid collection. No definite CT evidence for acute infarction. Diffuse loss of parenchymal volume is consistent with atrophy. Patchy low attenuation in the deep hemispheric and periventricular white matter is nonspecific, but likely reflects chronic microvascular ischemic demyelination. Old lacunar infarct identified in the left basal ganglia.  The visualized paranasal sinuses and mastoid air cells are clear.  IMPRESSION: No acute intracranial abnormality.  Atrophy with chronic small vessel white  matter ischemic disease.   Electronically Signed   By: Misty Stanley M.D.   On: 05/30/2014 21:15   Dg Chest Port 1 View  06/03/2014   CLINICAL DATA:  Recent diagnosis of pneumonia. New lower lung crackles.  EXAM: PORTABLE CHEST - 1 VIEW  COMPARISON:  PA and lateral chest 05/30/2014 and 01/20/2015.  FINDINGS: There is cardiomegaly and interstitial edema. Moderate bilateral pleural effusions are seen with associated basilar airspace disease. No pneumothorax.  IMPRESSION: Increased bilateral pleural effusions and basilar airspace disease which could be atelectasis or pneumonia. Atelectasis is favored.  Cardiomegaly and interstitial edema.   Electronically Signed   By: Inge Rise M.D.   On: 06/03/2014 15:55   Dg Swallowing Func-speech Pathology  06/05/2014   Macario Golds, CCC-SLP     06/05/2014  1:06 PM Objective Swallowing Evaluation: Modified Barium Swallowing Study   Patient Details  Name: Precilla Purnell MRN: [id number] Date of Birth: 10/21/1917  Today's Date: 06/05/2014 Time: 1210-1240 SLP Time Calculation (min) (ACUTE ONLY): 30 min  Past Medical History:  Past Medical History  Diagnosis Date  . CHF (congestive heart failure)   . Pleural effusion   . HTN (hypertension)   . Stroke   . Hyperlipidemia   . Diabetes mellitus without complication   . Compound fracture     left leg  . AAA (abdominal aortic aneurysm)   . Eczema   . Compression fracture     x 2    Past Surgical History:  Past Surgical History  Procedure Laterality Date  . Cholecystectomy    . Abdominal hysterectomy    . Knee surgery      right knee   . Corneal transplant     HPI:  81 y.o. female with history of chronic atrial fibrillation,  hypertension, CHF, abdominal aortic aneurysm and compression  fracture admitted after fall at ALF. Pt found to have mildly  elevated troponin,labs revealed leukocytosis and UA shows  possible UTI. CXR 1/6 revealed increased bilateral pleural  effusions and basilar airspace disease     Assessment / Plan /  Recommendation Clinical Impression  Dysphagia Diagnosis: Moderate oral phase dysphagia;Moderate  pharyngeal phase dysphagia  Clinical impression: Moderate oropharyngeal with suspected  component of esophageal dysphagia.  Pt with decreased oral  coordination resulting in lingual rocking, oral residuals and  premature spillage of barium into pharynx.  Pharyngeal swallow  was delayed but overall strong without residuals.    Liquid swallows trigger at pyriform sinus allowing laryngeal  penetration before the swallow as pyriform fills.  Pt with  reflexive cough with liquid penetration that largely cleared  trace penetration.  Secretions also appeared to be in pharynx  mixing with barium.     Tsp amount of thin with chin tuck prevented penetration prior to  swallow initiation.  Oral residuals prematurely spill into  pharynx without adequate sensation and pt required verbal cues to  swallow.   Esophagus appeared with Pt will be a chronic aspiration risk due  to her sensorimotor deficits.   Appearance of delayed clearance  of esophagus with barium filling from distal to mid region  without pt awareness, liquid swallow (THIN tsp) faciliated  clearance, radiologist not present to confirm findings.    Options include dys3/thin diet with aspiration precautions (thin  appeared to clear esophagus better) or to modify to nectar thick  liquids.  Will defer to MD-of note pt reports h/o frequently  coughing with intake for two years that has improved.       Treatment Recommendation  Therapy as outlined in treatment plan below    Diet Recommendation Dysphagia 3 (Mechanical Soft);Thin liquid   Liquid Administration via: Straw Medication Administration: Whole meds with puree Supervision: Patient able to self feed;Full supervision/cueing  for compensatory strategies;Staff to assist with self feeding Compensations: Slow rate;Small sips/bites;Follow solids with  liquid (intermittent dry swallow) Postural Changes and/or Swallow Maneuvers:  Seated upright 90  degrees;Upright 30-60 min after meal;Chin tuck    Other  Recommendations Oral Care Recommendations: Oral care BID   Follow Up Recommendations    follow up SLP for dysphagia management   Frequency and Duration min 2x/week  1 week     General Date of Onset: 06/05/14 HPI: 81 y.o. female  with history of chronic atrial fibrillation,  hypertension, CHF, abdominal aortic aneurysm and compression  fracture admitted after fall at ALF. Pt found to have mildly  elevated troponin,labs revealed leukocytosis and UA shows  possible UTI. CXR 1/6 revealed increased bilateral pleural  effusions and basilar airspace disease Type of Study: Modified Barium Swallowing Study Reason for Referral: Objectively evaluate swallowing function Previous Swallow Assessment:  (none found) Diet Prior to this Study: Dysphagia 2 (chopped);Honey-thick  liquids Temperature Spikes Noted: No Respiratory Status: Nasal cannula History of Recent Intubation: No Behavior/Cognition: Alert;Pleasant mood;Cooperative;Requires  cueing Oral Cavity - Dentition: Dentures, top (natural lower) Oral Motor / Sensory Function: Impaired - see Bedside swallow  eval Self-Feeding Abilities: Able to feed self;Needs set up Patient Positioning: Upright in chair Baseline Vocal Quality: Clear Volitional Cough: Weak Volitional Swallow: Able to elicit Anatomy: Within functional limits    Reason for Referral Objectively evaluate swallowing function   Oral Phase Oral Preparation/Oral Phase Oral Phase: Impaired Oral - Nectar Oral - Nectar Cup: Piecemeal swallowing;Delayed oral  transit;Lingual/palatal residue;Lingual pumping Oral - Thin Oral - Thin Teaspoon: Piecemeal swallowing;Delayed oral  transit;Lingual/palatal residue;Lingual pumping Oral - Thin Straw: Piecemeal swallowing;Delayed oral  transit;Lingual/palatal residue;Lingual pumping Oral - Solids Oral - Puree: Piecemeal swallowing;Delayed oral  transit;Lingual/palatal residue;Weak lingual manipulation Oral -  Regular: Piecemeal swallowing;Delayed oral  transit;Lingual/palatal residue;Weak lingual manipulation   Pharyngeal Phase Pharyngeal Phase Pharyngeal Phase: Impaired Pharyngeal - Nectar Pharyngeal - Nectar Teaspoon: Delayed swallow  initiation;Premature spillage to pyriform sinuses;Reduced tongue  base retraction Pharyngeal - Nectar Cup: Delayed swallow initiation;Premature  spillage to pyriform sinuses;Reduced tongue base  retraction;Penetration/Aspiration before swallow Penetration/Aspiration details (nectar cup): Material enters  airway, remains ABOVE vocal cords and not ejected out Pharyngeal - Thin Pharyngeal - Thin Teaspoon: Delayed swallow initiation;Premature  spillage to pyriform sinuses;Reduced tongue base  retraction;Penetration/Aspiration before swallow Penetration/Aspiration details (thin teaspoon): Material enters  airway, CONTACTS cords and not ejected out Pharyngeal - Thin Straw: Delayed swallow initiation;Premature  spillage to pyriform sinuses;Reduced tongue base  retraction;Penetration/Aspiration before swallow Penetration/Aspiration details (thin straw): Material enters  airway, CONTACTS cords and not ejected out Pharyngeal - Solids Pharyngeal - Puree: Delayed swallow initiation;Premature spillage  to valleculae;Reduced tongue base retraction Pharyngeal - Regular: Delayed swallow initiation;Premature  spillage to valleculae;Reduced tongue base retraction Pharyngeal Phase - Comment Pharyngeal Comment: trace laryngeal penetration  Cervical Esophageal Phase    GO    Cervical Esophageal Phase Cervical Esophageal Phase: Impaired Cervical Esophageal Phase - Comment Cervical Esophageal Comment: appearance of delayed clearance of  esophagus with barium filling from distal to mid region without  pt awareness, liquid swallow (thin tsp) faciliated clearance,  radiologist not present to confirm findings          Luanna Salk, [facility] SLP [phone]     Microbiology: Recent Results (from the past 240  hour(s))  MRSA PCR Screening     Status: None   Collection Time: 05/31/14  3:30 AM  Result Value Ref Range Status   MRSA by PCR NEGATIVE NEGATIVE Final    Comment:        The GeneXpert MRSA Assay (FDA approved for NASAL specimens only), is one component of a comprehensive MRSA colonization surveillance program. It is not intended to diagnose MRSA infection nor to guide or monitor treatment for MRSA infections.   Culture, blood (routine x 2)     Status: None   Collection Time: 05/31/14  9:43 AM  Result Value Ref Range Status   Specimen Description BLOOD RIGHT ARM  Final  Special Requests   Final    BOTTLES DRAWN AEROBIC AND ANAEROBIC 10CC BOTH BOTTLES   Culture   Final    NO GROWTH 5 DAYS Performed at [facility]    Report Status 06/06/2014 FINAL  Final  Culture, blood (routine x 2)     Status: None   Collection Time: 05/31/14 12:30 PM  Result Value Ref Range Status   Specimen Description BLOOD RIGHT ARM  Final   Special Requests   Final    BOTTLES DRAWN AEROBIC AND ANAEROBIC 10CC BOTH BOTTLES   Culture   Final    NO GROWTH 5 DAYS Performed at [facility]    Report Status 06/06/2014 FINAL  Final  Culture, Urine     Status: None   Collection Time: 06/01/14  3:16 PM  Result Value Ref Range Status   Specimen Description URINE, CATHETERIZED  Final   Special Requests NONE  Final   Colony Count NO GROWTH Performed at [facility]   Final   Culture NO GROWTH Performed at [facility]   Final   Report Status 06/03/2014 FINAL  Final  Clostridium Difficile by PCR     Status: Abnormal   Collection Time: 06/04/14  5:51 AM  Result Value Ref Range Status   C difficile by pcr POSITIVE (A) NEGATIVE Final    Comment: CRITICAL RESULT CALLED TO, READ BACK BY AND VERIFIED WITH: Norberta Keens RN 11:35 06/04/14 ([email or web address]) Performed at [place]: Basic Metabolic Panel:  Recent Labs Lab 06/03/14 0419 06/04/14 0412  06/05/14 0445 06/06/14 0440 06/07/14 0534 06/08/14 0408 06/09/14 0318  NA 133* 137 132*  --  135 139 139  K 4.0 2.7* 3.2*  --  4.4 5.1 4.7  CL 93* 93* 92*  --  99 101 99  CO2 30 35* 32  --  34* 32 33*  GLUCOSE 85 122* 104*  --  98 96 108*  BUN 22 23 29*  --  26* 20 19  CREATININE 0.76 0.79 0.90 0.83 0.75 0.63 0.70  CALCIUM 8.4 8.4 7.7*  --  8.1* 8.4 8.7  MG 1.9  --  1.6  --   --   --   --    Liver Function Tests:  Recent Labs Lab 06/05/14 0445  AST 12  ALT 9  ALKPHOS 55  BILITOT 0.8  PROT 5.2*  ALBUMIN 2.3*   No results for input(s): LIPASE, AMYLASE in the last 168 hours. No results for input(s): AMMONIA in the last 168 hours. CBC:  Recent Labs Lab 06/03/14 0630 06/05/14 0445 06/07/14 0534 06/08/14 0408 06/09/14 1230  WBC 19.0* 24.0* 16.0* 15.0* 15.3*  NEUTROABS  --   --  13.8* 12.4* 13.3*  HGB 13.9 14.0 13.8 13.8 14.1  HCT 44.0 43.6 42.8 42.3 43.2  MCV 99.8 97.5 99.3 98.1 97.5  PLT 238 308 352 317 389   Cardiac Enzymes: No results for input(s): CKTOTAL, CKMB, CKMBINDEX, TROPONINI in the last 168 hours. BNP: BNP (last 3 results) No results for input(s): PROBNP in the last 8760 hours. CBG:  Recent Labs Lab 06/05/14 0737 06/06/14 0804 06/07/14 0754 06/08/14 0752 06/09/14 0749  GLUCAP 95 101* 80 78 100*       Signed:  Aniston Christman, [facility] 06/09/2014, 1:40 PM

## 2014-06-09 NOTE — Progress Notes (Signed)
Report called to Quillian Quince at [facility]. PTAR has been called and arranged. Patient ready for discharge and awaiting transportation. Will continue to monitor.

## 2014-06-09 NOTE — Progress Notes (Signed)
Patient has a bed at [facility]. CSW has completed FL2 & will continue to follow and assist with discharge when ready.    [name], [facility] Clinical Social Worker cell #: [phone]

## 2014-06-10 ENCOUNTER — Non-Acute Institutional Stay (SKILLED_NURSING_FACILITY): Payer: Commercial Managed Care - HMO | Admitting: Internal Medicine

## 2014-06-10 ENCOUNTER — Encounter: Payer: Self-pay | Admitting: Internal Medicine

## 2014-06-10 DIAGNOSIS — A0472 Enterocolitis due to Clostridium difficile, not specified as recurrent: Secondary | ICD-10-CM

## 2014-06-10 DIAGNOSIS — I5033 Acute on chronic diastolic (congestive) heart failure: Secondary | ICD-10-CM

## 2014-06-10 DIAGNOSIS — I10 Essential (primary) hypertension: Secondary | ICD-10-CM

## 2014-06-10 DIAGNOSIS — A047 Enterocolitis due to Clostridium difficile: Secondary | ICD-10-CM

## 2014-06-10 DIAGNOSIS — I482 Chronic atrial fibrillation, unspecified: Secondary | ICD-10-CM

## 2014-06-10 DIAGNOSIS — D72829 Elevated white blood cell count, unspecified: Secondary | ICD-10-CM

## 2014-06-10 DIAGNOSIS — J690 Pneumonitis due to inhalation of food and vomit: Secondary | ICD-10-CM

## 2014-06-10 DIAGNOSIS — L8992 Pressure ulcer of unspecified site, stage 2: Secondary | ICD-10-CM

## 2014-06-10 DIAGNOSIS — M109 Gout, unspecified: Secondary | ICD-10-CM

## 2014-06-10 DIAGNOSIS — R5381 Other malaise: Secondary | ICD-10-CM

## 2014-06-10 DIAGNOSIS — I639 Cerebral infarction, unspecified: Secondary | ICD-10-CM

## 2014-06-10 NOTE — Progress Notes (Signed)
Patient ID: Jean Murphy, female   DOB: 1918/02/16, 81 y.o.   MRN: [id number]     [facility]   PCP: FULP, CAMMIE, MD  Code Status: DNR  Allergies  Allergen Reactions  . Penicillins     unknown  . Shellfish Allergy     Hives      Chief Complaint  Patient presents with  . New Admit To SNF     HPI:  81 year old patient is here for short term rehabilitation post hospital admission from 05/30/14-06/09/14 with chf exacerbation, hypertensive urgency, aspiration pneumonia, c.diff enteritis and acute gout flare. She is here for rehabilitation with her deconditioning. She is seen in her room today. She is on o2 by nasal canula. She feels weak and gets tired easily and mentions she has been working with therapy team. She does not have any more loose stool. Her appetite is picking up. Denies any dyspnea today.  She has history of CHF, HTN, AAA, old CVA among others  Review of Systems:  Constitutional: Negative for fever, chills, diaphoresis.  HENT: Negative for headache, congestion, nasal discharge Eyes: Negative for eye pain, blurred vision, double vision and discharge.  Respiratory: Negative for shortness of breath and wheezing.  has some cough and on o2 Cardiovascular: Negative for chest pain, palpitations, leg swelling.  Gastrointestinal: Negative for heartburn, nausea, vomiting, abdominal pain Genitourinary: Negative for dysuria, flank pain.  Musculoskeletal: Negative for back pain, falls Skin: Negative for itching, rash.  Neurological: Negative for dizziness, tingling, focal weakness Psychiatric/Behavioral: Negative for depression  Past Medical History  Diagnosis Date  . CHF (congestive heart failure)   . Pleural effusion   . HTN (hypertension)   . Stroke   . Hyperlipidemia   . Diabetes mellitus without complication   . Compound fracture     left leg  . AAA (abdominal aortic aneurysm)   . Eczema   . Compression fracture     x 2     Past Surgical History  Procedure Laterality Date  . Cholecystectomy    . Abdominal hysterectomy    . Knee surgery      right knee   . Corneal transplant     Social History:   reports that she has quit smoking. Her smoking use included Cigarettes. She smoked 0.00 packs per day for 20 years. She does not have any smokeless tobacco history on file. She reports that she does not drink alcohol or use illicit drugs.  Family History  Problem Relation Age of Onset  . Heart attack Mother 40  . Heart disease Mother     Medications: Patient's Medications  New Prescriptions   No medications on file  Previous Medications   ACETAMINOPHEN (TYLENOL) 500 MG TABLET    Take 500 mg by mouth every 6 (six) hours as needed for mild pain or headache.    ACIDOPHILUS (RISAQUAD) CAPS CAPSULE    Take 1 capsule by mouth 2 (two) times daily.   ASPIRIN 81 MG TABLET    Take 81 mg by mouth daily.   CALCITONIN, SALMON, (MIACALCIN/FORTICAL) 200 UNIT/ACT NASAL SPRAY    Place 1 spray into alternate nostrils daily.   CLINDAMYCIN (CLEOCIN) 300 MG CAPSULE    Take 1 capsule (300 mg total) by mouth every 8 (eight) hours.   COLCHICINE 0.6 MG TABLET    Take 1 tablet (0.6 mg total) by mouth daily.   DIFLUPREDNATE (DUREZOL) 0.05 % EMUL    Place 1 drop into both eyes  2 (two) times daily.    DIGOXIN (LANOXIN) 0.125 MG TABLET    Take by mouth daily.   FLUTICASONE (FLONASE) 50 MCG/ACT NASAL SPRAY    Place 1 spray into both nostrils daily.    FUROSEMIDE (LASIX) 20 MG TABLET    Take 20 mg by mouth 3 (three) times daily.    HYDRALAZINE (APRESOLINE) 50 MG TABLET    Take 50 mg by mouth every 8 (eight) hours.   METOPROLOL (LOPRESSOR) 50 MG TABLET    Take 50 mg by mouth 2 (two) times daily.   PAROXETINE (PAXIL) 20 MG TABLET    Take 20 mg by mouth daily.   VANCOMYCIN (VANCOCIN) 50 MG/ML ORAL SOLUTION    Take 2.5 mLs (125 mg total) by mouth 4 (four) times daily.  Modified Medications   No medications on file  Discontinued  Medications   No medications on file     Physical Exam: Filed Vitals:   06/10/14 1435  BP: 160/89  Pulse: 83  Temp: 96.9 F (36.1 C)  Resp: 18  Weight: 138 lb (62.596 kg)  SpO2: 98%    General- elderly female, thin built, in no acute distress but chronically ill appearing Head- normocephalic, atraumatic Throat- moist mucus membrane, dentition is poor Eyes- PERRLA, EOMI, no pallor, no icterus, no discharge, normal conjunctiva, normal sclera Neck- no cervical lymphadenopathy Cardiovascular- normal s1,s2, no murmurs, palpable dorsalis pedis, no leg edema Respiratory- bilateral poor air entry at bases but no wheeze, no rhonchi, no crackles, no use of accessory muscles, on o2 Abdomen- bowel sounds present, soft, non tender Musculoskeletal- able to move all 4 extremities, generalized weakness, normal range of motion Neurological- no focal deficit Skin- warm and dry, stage 2 sacral pressure ulcer Psychiatry- alert and oriented   Labs reviewed: Basic Metabolic Panel:  Recent Labs  06/02/14 0414 06/03/14 0419  06/05/14 0445  06/07/14 0534 06/08/14 0408 06/09/14 0318  NA 140 133*  < > 132*  --  135 139 139  K 3.4* 4.0  < > 3.2*  --  4.4 5.1 4.7  CL 95* 93*  < > 92*  --  99 101 99  CO2 40* 30  < > 32  --  34* 32 33*  GLUCOSE 86 85  < > 104*  --  98 96 108*  BUN 22 22  < > 29*  --  26* 20 19  CREATININE 0.72 0.76  < > 0.90  < > 0.75 0.63 0.70  CALCIUM 8.3* 8.4  < > 7.7*  --  8.1* 8.4 8.7  MG 1.7 1.9  --  1.6  --   --   --   --   < > = values in this interval not displayed. Liver Function Tests:  Recent Labs  05/31/14 0050 06/05/14 0445  AST 29 12  ALT 13 9  ALKPHOS 57 55  BILITOT 1.2 0.8  PROT 5.9* 5.2*  ALBUMIN 2.9* 2.3*   No results for input(s): LIPASE, AMYLASE in the last 8760 hours. No results for input(s): AMMONIA in the last 8760 hours. CBC:  Recent Labs  06/07/14 0534 06/08/14 0408 06/09/14 1230  WBC 16.0* 15.0* 15.3*  NEUTROABS 13.8* 12.4* 13.3*   HGB 13.8 13.8 14.1  HCT 42.8 42.3 43.2  MCV 99.3 98.1 97.5  PLT 352 317 389   Cardiac Enzymes:  Recent Labs  05/30/14 2040  05/31/14 0050 05/31/14 0547 05/31/14 1230  CKTOTAL 147  --   --   --   --  TROPONINI  --   < > 0.26* 0.21* 0.22*  < > = values in this interval not displayed. BNP: Invalid input(s): POCBNP CBG:  Recent Labs  06/07/14 0754 06/08/14 0752 06/09/14 0749  GLUCAP 80 78 100*     Assessment/Plan  Physical deconditioning Will have her work with physical therapy and occupational therapy team to help with gait training and muscle strengthening exercises.fall precautions. Skin care. Encourage to be out of bed.   acute on chronic diastolic heart failure euvolemic on exam. Continue lasix 20 mg tid and lopressor 50 mg bid with hydralazine tid for now- dosing adjusted, see below, check bmp. Monitor weight  C.diff colitis No loose stools, continue acidophilus and po vancomycin and complete 2 weeks course  Aspiration pneumonia Continue clindamycin 300 mg tid until 06/11/14 course, aspiration precuations. Continue mechanical soft diet  Uncontrolled HTN  Change hydralazine to 75 mg tid and continue lopressor and monitor bp  Stage 2 pressure ulcer Pressure ulcer prophylaxis, wound care  Leukocytosis Likely in setting of her pneumonia and c.diff colitis, afebrile and at her baseline. Check cbc with diff  Old cva Continue baby aspirin, bp med adjusted  Gout Denies further pain. On colchicine 0.6 mg daily  afib Rate controlled. Continue lopressor 50 mg bid and digoxin 0.125 mg daily with baby aspirin  Goals of care: short term rehabilitation   Labs/tests ordered: cbc with diff, cmp  Family/ staff Communication: reviewed care plan with patient and nursing supervisor    Blanchie Serve, MD  [place] [phone] (Monday-Friday 8 am - 5 pm) [phone] (afterhours)

## 2014-07-02 ENCOUNTER — Non-Acute Institutional Stay (SKILLED_NURSING_FACILITY): Payer: Commercial Managed Care - HMO | Admitting: Adult Health

## 2014-07-02 ENCOUNTER — Encounter: Payer: Self-pay | Admitting: Adult Health

## 2014-07-02 DIAGNOSIS — I5032 Chronic diastolic (congestive) heart failure: Secondary | ICD-10-CM | POA: Diagnosis not present

## 2014-07-02 DIAGNOSIS — F329 Major depressive disorder, single episode, unspecified: Secondary | ICD-10-CM | POA: Diagnosis not present

## 2014-07-02 DIAGNOSIS — J309 Allergic rhinitis, unspecified: Secondary | ICD-10-CM

## 2014-07-02 DIAGNOSIS — E43 Unspecified severe protein-calorie malnutrition: Secondary | ICD-10-CM | POA: Diagnosis not present

## 2014-07-02 DIAGNOSIS — I482 Chronic atrial fibrillation, unspecified: Secondary | ICD-10-CM

## 2014-07-02 DIAGNOSIS — R5381 Other malaise: Secondary | ICD-10-CM | POA: Diagnosis not present

## 2014-07-02 DIAGNOSIS — M1A00X0 Idiopathic chronic gout, unspecified site, without tophus (tophi): Secondary | ICD-10-CM

## 2014-07-02 DIAGNOSIS — Z8673 Personal history of transient ischemic attack (TIA), and cerebral infarction without residual deficits: Secondary | ICD-10-CM

## 2014-07-02 DIAGNOSIS — E876 Hypokalemia: Secondary | ICD-10-CM | POA: Diagnosis not present

## 2014-07-02 DIAGNOSIS — I10 Essential (primary) hypertension: Secondary | ICD-10-CM

## 2014-07-02 DIAGNOSIS — F32A Depression, unspecified: Secondary | ICD-10-CM
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
## 2014-07-08 ENCOUNTER — Non-Acute Institutional Stay (SKILLED_NURSING_FACILITY): Payer: Commercial Managed Care - HMO | Admitting: Adult Health

## 2014-07-08 ENCOUNTER — Encounter: Payer: Self-pay | Admitting: Adult Health

## 2014-07-08 DIAGNOSIS — I482 Chronic atrial fibrillation, unspecified: Secondary | ICD-10-CM

## 2014-07-08 DIAGNOSIS — K5900 Constipation, unspecified: Secondary | ICD-10-CM

## 2014-07-08 DIAGNOSIS — J309 Allergic rhinitis, unspecified: Secondary | ICD-10-CM

## 2014-07-08 DIAGNOSIS — M1A00X0 Idiopathic chronic gout, unspecified site, without tophus (tophi): Secondary | ICD-10-CM

## 2014-07-08 DIAGNOSIS — Z8673 Personal history of transient ischemic attack (TIA), and cerebral infarction without residual deficits: Secondary | ICD-10-CM

## 2014-07-08 DIAGNOSIS — I5032 Chronic diastolic (congestive) heart failure: Secondary | ICD-10-CM

## 2014-07-08 DIAGNOSIS — F329 Major depressive disorder, single episode, unspecified: Secondary | ICD-10-CM

## 2014-07-08 DIAGNOSIS — E876 Hypokalemia: Secondary | ICD-10-CM

## 2014-07-08 DIAGNOSIS — I10 Essential (primary) hypertension: Secondary | ICD-10-CM

## 2014-07-08 DIAGNOSIS — F32A Depression, unspecified: Secondary | ICD-10-CM

## 2014-07-08 DIAGNOSIS — E43 Unspecified severe protein-calorie malnutrition: Secondary | ICD-10-CM

## 2014-07-08 DIAGNOSIS — R5381 Other malaise: Secondary | ICD-10-CM

## 2014-07-08 NOTE — Progress Notes (Signed)
Patient ID: Jean Murphy, female   DOB: 26-Nov-1917, 81 y.o.   MRN: [id number]   07/08/2014  Facility:  [facility] Home Location:  [place] Room Number: 1101-1 LEVEL OF CARE:  SNF (31)   Chief Complaint  Patient presents with  . Discharge Note    Physical deconditioning, CHF, gout, atrial fibrillation, history of CVA, allergic rhinitis, depression, hypokalemia, hypertension and protein calorie malnutrition    HISTORY OF PRESENT ILLNESS:  This is a 81 year old female who is for discharge to ALF with Home health PT for gait and therapeutic exercises, OT for self care and Nursing for desease management. She has been admitted to [facility] on 06/09/14 from [facility]. She has been admitted to hospital due to chf exacerbation, hypertensive urgency, aspiration pneumonia, c.diff enteritis and acute gout flare. She was treated for UTI in the hospital with ceftriaxone. Her pneumonia  Has been resolved. She has PMH of chronic atrial fibrillation, hypertension and CHF. She was admitted to this facility for short-term rehabilitation after recent hospitalization.  She has completed SNF rehabilitation and therapy has been cleared  for discharge.  PAST MEDICAL HISTORY:  Past Medical History  Diagnosis Date  . CHF (congestive heart failure)   . Pleural effusion   . HTN (hypertension)   . Stroke   . Hyperlipidemia   . Diabetes mellitus without complication   . Compound fracture     left leg  . AAA (abdominal aortic aneurysm)   . Eczema   . Compression fracture     x 2     CURRENT MEDICATIONS: Reviewed per MAR/see medication list  Allergies  Allergen Reactions  . Penicillins     unknown  . Shellfish Allergy     Hives       REVIEW OF SYSTEMS:  GENERAL: no change in appetite, no fatigue, no weight changes, no fever, chills or weakness RESPIRATORY: no cough, SOB, DOE, wheezing, hemoptysis CARDIAC: no chest pain, or palpitations GI: no abdominal  pain, diarrhea, constipation, heart burn, nausea or vomiting  PHYSICAL EXAMINATION  GENERAL: no acute distress, normal body habitus NECK: supple, trachea midline, no neck masses, no thyroid tenderness, no thyromegaly LYMPHATICS: no LAN in the neck, no supraclavicular LAN RESPIRATORY: breathing is even & unlabored, BS CTAB CARDIAC: RRR, no murmur,no extra heart sounds, BLE edema 1+ GI: abdomen soft, normal BS, no masses, no tenderness, no hepatomegaly, no splenomegaly Vagina and vulva are normal;  no discharge is noted.   EXTREMITIES:  Able to move 4 extremities; walks with walker PSYCHIATRIC: the patient is alert & oriented to person, affect & behavior appropriate  LABS/RADIOLOGY: 07/03/14  WBC 6.6 hemoglobin 10.3 hematocrit 32.0 MCV 96.0 sodium 141 potassium 3.8 glucose 87 BUN 13 creatinine 0.73 calcium 8.7 07/01/13  urine culture shows 40,000 colonies/mL multiple bacterial morphotypes Presson, none predominant 06/24/14  WBC 7.9 hemoglobin 11.1 hematocrit 34.2 MCV 98.6 sodium 142 potassium 3.4 glucose 136 BUN 15 creatinine 0.7 calcium 8.5 GFR>60 06/22/14  WBC 10.5 hemoglobin 12.7 hematocrit 40.0 MCV 99.5 digoxin 1.2 06/15/14  sodium 140 potassium 3.3 glucose 80 total protein 4.9 albumin 2.3 ALP 50 AST 15 ALT 8 BUN 9 creatinine 0.7 calcium 8.2 GFR>60 WBC 11.4 hemoglobin 12.2  hematocrit 37.1 MCV 97.4 Labs reviewed: Basic Metabolic Panel:  Recent Labs  06/02/14 0414 06/03/14 0419  06/05/14 0445  06/07/14 0534 06/08/14 0408 06/09/14 0318  NA 140 133*  < > 132*  --  135 139 139  K 3.4*  4.0  < > 3.2*  --  4.4 5.1 4.7  CL 95* 93*  < > 92*  --  99 101 99  CO2 40* 30  < > 32  --  34* 32 33*  GLUCOSE 86 85  < > 104*  --  98 96 108*  BUN 22 22  < > 29*  --  26* 20 19  CREATININE 0.72 0.76  < > 0.90  < > 0.75 0.63 0.70  CALCIUM 8.3* 8.4  < > 7.7*  --  8.1* 8.4 8.7  MG 1.7 1.9  --  1.6  --   --   --   --   < > = values in this interval not displayed. Liver Function Tests:  Recent Labs   05/31/14 0050 06/05/14 0445  AST 29 12  ALT 13 9  ALKPHOS 57 55  BILITOT 1.2 0.8  PROT 5.9* 5.2*  ALBUMIN 2.9* 2.3*   CBC:  Recent Labs  06/07/14 0534 06/08/14 0408 06/09/14 1230  WBC 16.0* 15.0* 15.3*  NEUTROABS 13.8* 12.4* 13.3*  HGB 13.8 13.8 14.1  HCT 42.8 42.3 43.2  MCV 99.3 98.1 97.5  PLT 352 317 389   Cardiac Enzymes:  Recent Labs  05/30/14 2040  05/31/14 0050 05/31/14 0547 05/31/14 1230  CKTOTAL 147  --   --   --   --   TROPONINI  --   < > 0.26* 0.21* 0.22*  < > = values in this interval not displayed.  CBG:  Recent Labs  06/07/14 0754 06/08/14 0752 06/09/14 0749  GLUCAP 80 78 100*    No results found.  ASSESSMENT/PLAN:  Physical deconditioning - home health PT, OT and nursing Chronic diastolic heart failure - stable; continue Lasix 20 mg by mouth 3 times a day and digoxin 0.125 mg 1 tab by mouth daily Gout - no complaints of joint pain; continue Colchicine 0.6 mg 1 by mouth daily and allopurinol 100 mg 1 tab by mouth daily Chronic atrial fibrillation - rate controlled; continue digoxin 0.125 mg 1 tab by mouth daily and Lopressor 50 mg 1 tab by mouth twice a day History of CVA - stable; continue aspirin 81 mg by mouth daily Allergic rhinitis - continue Flonase 50 g 1 spray in each nostril daily Depression - mood is stable; continue Paxil 20 mg 1 tab by mouth daily Hypokalemia - K3.8; continue K Dur 20 meq ER tab by mouth daily Hypertension - well controlled; continue hydralazine 50 mg 1 1/2 tab = 75 mg by mouth 3 times a day Protein-calorie malnutrition, severe - continue supplementation Constipation - continue Colace 100 mg PO BID   I have filled out patient's discharge paperwork and written prescriptions.  Patient will receive home health PT, OT and Nursing.  Total discharge time: Greater than 30 minutes  Discharge time involved coordination of the discharge process with social worker, nursing staff and therapy department. Medical  justification for home health services verified.    [facility], NP [employer] [phone]

## 2014-07-13 ENCOUNTER — Encounter (HOSPITAL_COMMUNITY): Payer: Self-pay | Admitting: Family Medicine

## 2014-07-13 ENCOUNTER — Emergency Department (HOSPITAL_COMMUNITY)
Admission: EM | Admit: 2014-07-13 | Discharge: 2014-07-13 | Disposition: A | Discharge status: Home / Self Care | Payer: Commercial Managed Care - HMO | Attending: Emergency Medicine | Admitting: Emergency Medicine

## 2014-07-13 ENCOUNTER — Emergency Department (HOSPITAL_COMMUNITY): Payer: Commercial Managed Care - HMO

## 2014-07-13 DIAGNOSIS — S0083XA Contusion of other part of head, initial encounter: Secondary | ICD-10-CM | POA: Diagnosis not present

## 2014-07-13 DIAGNOSIS — S199XXA Unspecified injury of neck, initial encounter: Secondary | ICD-10-CM | POA: Insufficient documentation

## 2014-07-13 DIAGNOSIS — I480 Paroxysmal atrial fibrillation: Secondary | ICD-10-CM | POA: Insufficient documentation

## 2014-07-13 DIAGNOSIS — S8992XA Unspecified injury of left lower leg, initial encounter: Secondary | ICD-10-CM | POA: Diagnosis not present

## 2014-07-13 DIAGNOSIS — Z79899 Other long term (current) drug therapy: Secondary | ICD-10-CM | POA: Insufficient documentation

## 2014-07-13 DIAGNOSIS — R001 Bradycardia, unspecified: Secondary | ICD-10-CM

## 2014-07-13 DIAGNOSIS — Z7951 Long term (current) use of inhaled steroids: Secondary | ICD-10-CM | POA: Diagnosis not present

## 2014-07-13 DIAGNOSIS — M25562 Pain in left knee: Secondary | ICD-10-CM

## 2014-07-13 DIAGNOSIS — Z7982 Long term (current) use of aspirin: Secondary | ICD-10-CM | POA: Insufficient documentation

## 2014-07-13 DIAGNOSIS — Z880 Allergy status to penicillin: Secondary | ICD-10-CM | POA: Insufficient documentation

## 2014-07-13 DIAGNOSIS — W1812XA Fall from or off toilet with subsequent striking against object, initial encounter: Secondary | ICD-10-CM | POA: Diagnosis not present

## 2014-07-13 DIAGNOSIS — Y9389 Activity, other specified: Secondary | ICD-10-CM | POA: Insufficient documentation

## 2014-07-13 DIAGNOSIS — Y92091 Bathroom in other non-institutional residence as the place of occurrence of the external cause: Secondary | ICD-10-CM | POA: Diagnosis not present

## 2014-07-13 DIAGNOSIS — Y998 Other external cause status: Secondary | ICD-10-CM | POA: Diagnosis not present

## 2014-07-13 DIAGNOSIS — I10 Essential (primary) hypertension: Secondary | ICD-10-CM | POA: Insufficient documentation

## 2014-07-13 DIAGNOSIS — S0990XA Unspecified injury of head, initial encounter: Secondary | ICD-10-CM

## 2014-07-13 HISTORY — DX: Essential (primary) hypertension: I10

## 2014-07-13 HISTORY — DX: Cardiac arrhythmia, unspecified: I49.9

## 2014-07-13 LAB — I-STAT CHEM 8, ED
BUN: 17 mg/dL (ref 6–23)
CALCIUM ION: 1.09 mmol/L — AB (ref 1.13–1.30)
Chloride: 95 mmol/L — ABNORMAL LOW (ref 96–112)
Creatinine, Ser: 0.7 mg/dL (ref 0.50–1.10)
Glucose, Bld: 138 mg/dL — ABNORMAL HIGH (ref 70–99)
HCT: 36 % (ref 36.0–46.0)
Hemoglobin: 12.2 g/dL (ref 12.0–15.0)
Potassium: 3.4 mmol/L — ABNORMAL LOW (ref 3.5–5.1)
Sodium: 141 mmol/L (ref 135–145)
TCO2: 30 mmol/L (ref 0–100)

## 2014-07-13 LAB — DIGOXIN LEVEL: DIGOXIN LVL: 0.8 ng/mL (ref 0.8–2.0)

## 2014-07-13 MED ORDER — ACETAMINOPHEN 325 MG PO TABS
650.0000 mg | ORAL_TABLET | Freq: Once | ORAL | Status: AC
  Administered 2014-07-13: 650 mg via ORAL
  Filled 2014-07-13: qty 2

## 2014-07-13 NOTE — ED Notes (Signed)
Patient transported to CT 

## 2014-07-13 NOTE — ED Provider Notes (Addendum)
CSN: [id number]     Arrival date & time 07/13/14  1002 History   First MD Initiated Contact with Patient 07/13/14 1009     Chief Complaint  Patient presents with  . Fall     (Consider location/radiation/quality/duration/timing/severity/associated sxs/prior Treatment) HPI Comments: 81 year-old female from assisted-living presents with EMS after fall. Patient was rushing to the toilet and her walker jammed into the toilet bowl causing her to fall and hit her for head and left knee. Patient has hematoma and ecchymosis. Patient recalled all events, denies syncope, chest pain, shortness of breath. Patient has pain with palpation. Patient denies blood thinners except for aspirin. Patient feels well otherwise except for muscular skeletal injuries.  Patient is a 81 y.o. female presenting with fall. The history is provided by the patient.  Fall Associated symptoms include headaches. Pertinent negatives include no chest pain, no abdominal pain and no shortness of breath.    Past Medical History  Diagnosis Date  . Hypertension   . Irregular heart beat    History reviewed. No pertinent past surgical history. History reviewed. No pertinent family history. History  Substance Use Topics  . Smoking status: Never Smoker   . Smokeless tobacco: Not on file  . Alcohol Use: Not on file   OB History    No data available     Review of Systems  Constitutional: Negative for fever and chills.  HENT: Negative for congestion.   Eyes: Negative for visual disturbance.  Respiratory: Negative for shortness of breath.   Cardiovascular: Negative for chest pain.  Gastrointestinal: Negative for vomiting and abdominal pain.  Genitourinary: Negative for dysuria and flank pain.  Musculoskeletal: Positive for neck pain. Negative for back pain and neck stiffness.  Skin: Positive for color change and wound. Negative for rash.  Neurological: Positive for headaches. Negative for light-headedness.      Allergies   Penicillins  Home Medications   Prior to Admission medications   Medication Sig Start Date End Date Taking? Authorizing Provider  aspirin 81 MG chewable tablet Chew 81 mg by mouth daily.   Yes Historical Provider, MD  calcitonin, salmon, (MIACALCIN/FORTICAL) 200 UNIT/ACT nasal spray Place 1 spray into alternate nostrils daily.   Yes Historical Provider, MD  Difluprednate 0.05 % EMUL Apply 1 drop to eye daily.   Yes Historical Provider, MD  digoxin (LANOXIN) 0.125 MG tablet Take 0.125 mg by mouth daily.   Yes Historical Provider, MD  docusate sodium (COLACE) 100 MG capsule Take 100 mg by mouth 2 (two) times daily.   Yes Historical Provider, MD  FLORA-Q [facility]) CAPS capsule Take 1 capsule by mouth 2 (two) times daily.   Yes Historical Provider, MD  fluticasone (FLONASE) 50 MCG/ACT nasal spray Place 1 spray into both nostrils daily.   Yes Historical Provider, MD  furosemide (LASIX) 20 MG tablet Take 20 mg by mouth 3 (three) times daily.   Yes Historical Provider, MD  hydrALAZINE (APRESOLINE) 50 MG tablet Take 50 mg by mouth 3 (three) times daily.   Yes Historical Provider, MD  metoprolol succinate (TOPROL-XL) 50 MG 24 hr tablet Take 50 mg by mouth daily. Take with or immediately following a meal.   Yes Historical Provider, MD  Multiple Vitamins-Minerals (DECUBI-VITE) CAPS Take 1 capsule by mouth daily.   Yes Historical Provider, MD  PARoxetine (PAXIL) 20 MG tablet Take 20 mg by mouth daily.   Yes Historical Provider, MD  potassium chloride SA (K-DUR,KLOR-CON) 20 MEQ tablet Take 20 mEq by mouth daily.  Yes Historical Provider, MD   BP 141/78 mmHg  Pulse 57  Temp(Src) 97.6 F (36.4 C)  Resp 23  SpO2 98% Physical Exam  Constitutional: She is oriented to person, place, and time. She appears well-developed and well-nourished.  HENT:  Head: Normocephalic.  Patient has left frontal hematoma tenderness palpation  Eyes: Conjunctivae are normal. Right eye exhibits no discharge. Left eye  exhibits no discharge.  Neck: Normal range of motion. Neck supple. No tracheal deviation present.  Cardiovascular: An irregularly irregular rhythm present. Bradycardia present.   Pulmonary/Chest: Effort normal and breath sounds normal.  Abdominal: Soft. She exhibits no distension. There is no tenderness. There is no guarding.  Musculoskeletal: She exhibits tenderness. She exhibits no edema.  Patient has tenderness proximal cervical C-spine paraspinal and midline c-collar in place. Patient has tenderness left proximal tibia anterior knee, no effusion appreciated, good range of motion. Patient has no tenderness in hips bilateral with flexion extension and right knee with flexion extension. No midline thoracic or lumbar tenderness.  Neurological: She is alert and oriented to person, place, and time. No cranial nerve deficit.  Skin: Skin is warm. No rash noted.  Psychiatric: She has a normal mood and affect.  Nursing note and vitals reviewed.   ED Course  Procedures (including critical care time) Labs Review Labs Reviewed  I-STAT CHEM 8, ED - Abnormal; Notable for the following:    Potassium 3.4 (*)    Chloride 95 (*)    Glucose, Bld 138 (*)    Calcium, Ion 1.09 (*)    All other components within normal limits  DIGOXIN LEVEL    Imaging Review Ct Head Wo Contrast  07/13/2014   CLINICAL DATA:  [place] this morning with trauma to the left frontal region and neck sort mass.  EXAM: CT HEAD WITHOUT CONTRAST  CT CERVICAL SPINE WITHOUT CONTRAST  TECHNIQUE: Multidetector CT imaging of the head and cervical spine was performed following the standard protocol without intravenous contrast. Multiplanar CT image reconstructions of the cervical spine were also generated.  COMPARISON:  None.  FINDINGS: CT HEAD FINDINGS  The brain shows generalized atrophy. There chronic small-vessel ischemic changes affecting the cerebral hemispheric white matter. There is old lacunar infarction affecting the basal ganglia and  thalami. No large vessel territory infarction. No mass lesion, intracranial hemorrhage, hydrocephalus or extra-axial collection. Left frontal scalp hematoma without underlying skull fracture. No fluid in the sinuses, middle ears or mastoids. There is atherosclerotic calcification of the major vessels at the base of the brain.  CT CERVICAL SPINE FINDINGS  Alignment is normal. No fracture. There is degenerative change at the C1-2 articulation and of the left facet at C3-4. There is ordinary degenerative spondylosis at C3-4, C4-5, C5-6 and C6-7. No evidence of soft disc herniation. Other soft tissues of the neck are unremarkable. Lung apices are clear.  IMPRESSION: Head CT: No acute intracranial finding. Left frontal scalp hematoma. No skull fracture. Atrophy and chronic small vessel disease.  Cervical spine CT: No acute or traumatic finding. Chronic degenerative changes as outlined above.   Electronically Signed   By: Nelson Chimes M.D.   On: 07/13/2014 11:33   Ct Cervical Spine Wo Contrast  07/13/2014   CLINICAL DATA:  [place] this morning with trauma to the left frontal region and neck sort mass.  EXAM: CT HEAD WITHOUT CONTRAST  CT CERVICAL SPINE WITHOUT CONTRAST  TECHNIQUE: Multidetector CT imaging of the head and cervical spine was performed following the standard protocol without intravenous contrast. Multiplanar  CT image reconstructions of the cervical spine were also generated.  COMPARISON:  None.  FINDINGS: CT HEAD FINDINGS  The brain shows generalized atrophy. There chronic small-vessel ischemic changes affecting the cerebral hemispheric white matter. There is old lacunar infarction affecting the basal ganglia and thalami. No large vessel territory infarction. No mass lesion, intracranial hemorrhage, hydrocephalus or extra-axial collection. Left frontal scalp hematoma without underlying skull fracture. No fluid in the sinuses, middle ears or mastoids. There is atherosclerotic calcification of the major vessels  at the base of the brain.  CT CERVICAL SPINE FINDINGS  Alignment is normal. No fracture. There is degenerative change at the C1-2 articulation and of the left facet at C3-4. There is ordinary degenerative spondylosis at C3-4, C4-5, C5-6 and C6-7. No evidence of soft disc herniation. Other soft tissues of the neck are unremarkable. Lung apices are clear.  IMPRESSION: Head CT: No acute intracranial finding. Left frontal scalp hematoma. No skull fracture. Atrophy and chronic small vessel disease.  Cervical spine CT: No acute or traumatic finding. Chronic degenerative changes as outlined above.   Electronically Signed   By: Nelson Chimes M.D.   On: 07/13/2014 11:33   Dg Knee Complete 4 Views Left  07/13/2014   CLINICAL DATA:  Fall this morning. Knee pain. Trauma to the anterior aspect of the knee.  EXAM: LEFT KNEE - COMPLETE 4+ VIEW  COMPARISON:  None.  FINDINGS: Severe medial compartment osteoarthritis with marked joint space narrowing. Moderate patellofemoral osteoarthritis. Atherosclerosis is noted in the superficial femoral and popliteal arteries. No effusion. No fracture. Mild valgus deformity of the knee associated with medial compartment joint space collapse. Healed proximal fibular diaphysis fracture. Chondrocalcinosis of the lateral meniscus.  IMPRESSION: No acute osseous abnormality. Severe medial compartment and moderate patellofemoral compartment osteoarthritis.   Electronically Signed   By: Dereck Ligas M.D.   On: 07/13/2014 10:54     EKG Interpretation   Date/Time:  Monday July 13 2014 11:02:09 EST Ventricular Rate:  66 PR Interval:    QRS Duration: 85 QT Interval:  564 QTC Calculation: 591 R Axis:   -89 Text Interpretation:  Atrial fibrillation Left anterior fascicular block  Low voltage, precordial leads Borderline T abnormalities, inferior leads  Prolonged QT interval Confirmed by Tavious Griesinger  MD, Tyray Proch ([id number]) on 07/13/2014  11:48:58 AM      MDM   Final diagnoses:  Acute head  injury  Acute knee pain, left  Paroxysmal atrial fibrillation  Bradycardia   Patient presents after mechanical fall, recalled all events, obvious musculoskeletal injuries on exam. Plan for CT head, neck due to pain and age and x-ray left knee.    Patient improved on reassessment. No concerns except for injuries from the fall. Clarified with patient and family member she does recall events and definitely tripped and fell. Patient's EKG showed atrial fibrillation bradycardia prolonged QT multiple abnormalities. Discussed this with the patient has had a discussion with the cardiologist and with her age they decided not to pursue aggressive measures and baby aspirin a day. Patient will follow-up for reassessment with her cardiologist later this week and her primary doctor. Digoxin level normal. X-rays reviewed no acute fracture CT head results reviewed no acute bleeding. Results and differential diagnosis were discussed with the patient/parent/guardian. Close follow up outpatient was discussed, comfortable with the plan.   Medications  acetaminophen (TYLENOL) tablet 650 mg (650 mg Oral Given 07/13/14 1033)    Filed Vitals:   07/13/14 1004 07/13/14 1030 07/13/14 1130 07/13/14 1145  BP:  141/85 159/66 138/75 141/78  Pulse: 56 59 55 57  Temp: 97.6 F (36.4 C)     Resp: 16  13 23   SpO2: 98% 99% 99% 98%    Final diagnoses:  Acute head injury  Acute knee pain, left  Paroxysmal atrial fibrillation  Bradycardia           Mariea Clonts, MD 07/13/14 1243  Mariea Clonts, MD 07/13/14 1243

## 2014-07-13 NOTE — ED Notes (Signed)
Per EMS, pt fell off the toilet this am. Denies LOC or syncope. sts slipped. Pt hit head and has hematoma to her right forehead and bruising to left.

## 2014-07-13 NOTE — Discharge Instructions (Signed)
Call your heart Dr. to discuss her atrial fibrillation and recheck blood pressure and heart rate later this week. Take half of your metoprolol dose for the next few days and monitor your heart rate and blood pressure. Take baby aspirin a day for your atrial fibrillation to help prevent stroke.  If you were given medicines take as directed.  If you are on coumadin or contraceptives realize their levels and effectiveness is altered by many different medicines.  If you have any reaction (rash, tongues swelling, other) to the medicines stop taking and see a physician.   Please follow up as directed and return to the ER or see a physician for new or worsening symptoms.  Thank you. Filed Vitals:   07/13/14 1004 07/13/14 1030 07/13/14 1130 07/13/14 1145  BP: 141/85 159/66 138/75 141/78  Pulse: 56 59 55 57  Temp: 97.6 F (36.4 C)     Resp: 16  13 23   SpO2: 98% 99% 99% 98%

## 2014-07-13 NOTE — ED Notes (Signed)
ptar here, pt transported back to [employer], nad noted

## 2014-07-13 NOTE — ED Notes (Signed)
Patient transported to xr

## 2014-08-06 ENCOUNTER — Telehealth: Payer: Self-pay | Admitting: Cardiovascular Disease

## 2014-08-06 NOTE — Telephone Encounter (Signed)
New Message    Nurse from [facility] calling to see if its ok to decrease frequency of lasix  to twice a day with second dose given no later than 3 pm as patient has had some urinary incontience @ night .

## 2014-08-06 NOTE — Telephone Encounter (Signed)
Will forward to Dr. Acie Fredrickson for review and recommendations

## 2014-08-07 NOTE — Telephone Encounter (Signed)
Agree with the note changes.

## 2014-08-07 NOTE — Telephone Encounter (Signed)
[place]hea [place] CMA for Mrs. Owens Shark PA over at [place] at [facility]. Informed her that Dr. Acie Fredrickson is fine with their recommendations and changes. Jean Murphy is wanting to change Lasix to 40 mg in the morning and Lasix 20 mg around 3 pm. The total dose of the day would be 60 mg, which has not changed from previous order. Will forward to Dr. Acie Fredrickson, so he is aware.

## 2014-08-27 ENCOUNTER — Other Ambulatory Visit: Payer: Self-pay | Admitting: Family Medicine

## 2014-08-27 ENCOUNTER — Ambulatory Visit
Admission: RE | Admit: 2014-08-27 | Discharge: 2014-08-27 | Disposition: A | Discharge status: Home / Self Care | Payer: Commercial Managed Care - HMO | Source: Ambulatory Visit | Attending: Family Medicine | Admitting: Family Medicine

## 2014-08-27 DIAGNOSIS — Z09 Encounter for follow-up examination after completed treatment for conditions other than malignant neoplasm: Secondary | ICD-10-CM

## 2014-11-10 ENCOUNTER — Encounter: Payer: Self-pay | Admitting: Adult Health

## 2015-01-16 ENCOUNTER — Inpatient Hospital Stay (HOSPITAL_COMMUNITY): Payer: Commercial Managed Care - HMO | Admitting: Certified Registered Nurse Anesthetist

## 2015-01-16 ENCOUNTER — Emergency Department (HOSPITAL_COMMUNITY): Payer: Commercial Managed Care - HMO

## 2015-01-16 ENCOUNTER — Inpatient Hospital Stay (HOSPITAL_COMMUNITY)
Admission: EM | Admit: 2015-01-16 | Discharge: 2015-01-19 | DRG: 480 | Disposition: A | Discharge status: Skilled Nursing Facility | Payer: Commercial Managed Care - HMO | Attending: Internal Medicine | Admitting: Internal Medicine

## 2015-01-16 ENCOUNTER — Inpatient Hospital Stay (HOSPITAL_COMMUNITY): Payer: Commercial Managed Care - HMO

## 2015-01-16 ENCOUNTER — Encounter (HOSPITAL_COMMUNITY)
Admission: EM | Disposition: A | Discharge status: Skilled Nursing Facility | Payer: Self-pay | Source: Home / Self Care | Attending: Internal Medicine

## 2015-01-16 ENCOUNTER — Encounter (HOSPITAL_COMMUNITY): Payer: Self-pay | Admitting: *Deleted

## 2015-01-16 DIAGNOSIS — S72144A Nondisplaced intertrochanteric fracture of right femur, initial encounter for closed fracture: Secondary | ICD-10-CM

## 2015-01-16 DIAGNOSIS — S72009A Fracture of unspecified part of neck of unspecified femur, initial encounter for closed fracture: Secondary | ICD-10-CM | POA: Diagnosis present

## 2015-01-16 DIAGNOSIS — Z8249 Family history of ischemic heart disease and other diseases of the circulatory system: Secondary | ICD-10-CM | POA: Diagnosis not present

## 2015-01-16 DIAGNOSIS — Z8673 Personal history of transient ischemic attack (TIA), and cerebral infarction without residual deficits: Secondary | ICD-10-CM | POA: Diagnosis not present

## 2015-01-16 DIAGNOSIS — F32A Depression, unspecified: Secondary | ICD-10-CM | POA: Diagnosis present

## 2015-01-16 DIAGNOSIS — Z6824 Body mass index (BMI) 24.0-24.9, adult: Secondary | ICD-10-CM

## 2015-01-16 DIAGNOSIS — Z7982 Long term (current) use of aspirin: Secondary | ICD-10-CM | POA: Diagnosis not present

## 2015-01-16 DIAGNOSIS — Z515 Encounter for palliative care: Secondary | ICD-10-CM | POA: Diagnosis not present

## 2015-01-16 DIAGNOSIS — I5032 Chronic diastolic (congestive) heart failure: Secondary | ICD-10-CM | POA: Diagnosis present

## 2015-01-16 DIAGNOSIS — Z9889 Other specified postprocedural states: Secondary | ICD-10-CM

## 2015-01-16 DIAGNOSIS — D638 Anemia in other chronic diseases classified elsewhere: Secondary | ICD-10-CM | POA: Diagnosis not present

## 2015-01-16 DIAGNOSIS — E785 Hyperlipidemia, unspecified: Secondary | ICD-10-CM | POA: Diagnosis present

## 2015-01-16 DIAGNOSIS — S72141S Displaced intertrochanteric fracture of right femur, sequela: Secondary | ICD-10-CM | POA: Diagnosis not present

## 2015-01-16 DIAGNOSIS — Z880 Allergy status to penicillin: Secondary | ICD-10-CM

## 2015-01-16 DIAGNOSIS — S72001S Fracture of unspecified part of neck of right femur, sequela: Secondary | ICD-10-CM | POA: Diagnosis not present

## 2015-01-16 DIAGNOSIS — Z66 Do not resuscitate: Secondary | ICD-10-CM | POA: Diagnosis present

## 2015-01-16 DIAGNOSIS — Z79899 Other long term (current) drug therapy: Secondary | ICD-10-CM | POA: Diagnosis not present

## 2015-01-16 DIAGNOSIS — I482 Chronic atrial fibrillation: Secondary | ICD-10-CM | POA: Diagnosis not present

## 2015-01-16 DIAGNOSIS — W010XXA Fall on same level from slipping, tripping and stumbling without subsequent striking against object, initial encounter: Secondary | ICD-10-CM | POA: Diagnosis present

## 2015-01-16 DIAGNOSIS — W19XXXS Unspecified fall, sequela: Secondary | ICD-10-CM | POA: Diagnosis not present

## 2015-01-16 DIAGNOSIS — S72141A Displaced intertrochanteric fracture of right femur, initial encounter for closed fracture: Principal | ICD-10-CM | POA: Diagnosis present

## 2015-01-16 DIAGNOSIS — I272 Other secondary pulmonary hypertension: Secondary | ICD-10-CM | POA: Diagnosis present

## 2015-01-16 DIAGNOSIS — D62 Acute posthemorrhagic anemia: Secondary | ICD-10-CM | POA: Diagnosis not present

## 2015-01-16 DIAGNOSIS — I4891 Unspecified atrial fibrillation: Secondary | ICD-10-CM | POA: Diagnosis present

## 2015-01-16 DIAGNOSIS — Y92129 Unspecified place in nursing home as the place of occurrence of the external cause: Secondary | ICD-10-CM

## 2015-01-16 DIAGNOSIS — I10 Essential (primary) hypertension: Secondary | ICD-10-CM | POA: Diagnosis present

## 2015-01-16 DIAGNOSIS — E119 Type 2 diabetes mellitus without complications: Secondary | ICD-10-CM | POA: Diagnosis not present

## 2015-01-16 DIAGNOSIS — D72829 Elevated white blood cell count, unspecified: Secondary | ICD-10-CM | POA: Diagnosis present

## 2015-01-16 DIAGNOSIS — E43 Unspecified severe protein-calorie malnutrition: Secondary | ICD-10-CM | POA: Diagnosis not present

## 2015-01-16 DIAGNOSIS — Y9301 Activity, walking, marching and hiking: Secondary | ICD-10-CM | POA: Diagnosis not present

## 2015-01-16 DIAGNOSIS — F329 Major depressive disorder, single episode, unspecified: Secondary | ICD-10-CM | POA: Diagnosis not present

## 2015-01-16 DIAGNOSIS — Z91013 Allergy to seafood: Secondary | ICD-10-CM | POA: Diagnosis not present

## 2015-01-16 DIAGNOSIS — S72001A Fracture of unspecified part of neck of right femur, initial encounter for closed fracture: Secondary | ICD-10-CM | POA: Diagnosis present

## 2015-01-16 DIAGNOSIS — T501X5A Adverse effect of loop [high-ceiling] diuretics, initial encounter: Secondary | ICD-10-CM | POA: Diagnosis present

## 2015-01-16 DIAGNOSIS — C787 Secondary malignant neoplasm of liver and intrahepatic bile duct: Secondary | ICD-10-CM

## 2015-01-16 DIAGNOSIS — W19XXXA Unspecified fall, initial encounter: Secondary | ICD-10-CM | POA: Diagnosis present

## 2015-01-16 DIAGNOSIS — M25551 Pain in right hip: Secondary | ICD-10-CM | POA: Diagnosis present

## 2015-01-16 DIAGNOSIS — Z8781 Personal history of (healed) traumatic fracture: Secondary | ICD-10-CM

## 2015-01-16 DIAGNOSIS — E876 Hypokalemia: Secondary | ICD-10-CM | POA: Diagnosis present

## 2015-01-16 HISTORY — PX: FEMUR IM NAIL: SHX1597

## 2015-01-16 LAB — CBC WITH DIFFERENTIAL/PLATELET
BASOS PCT: 1 % (ref 0–1)
BASOS PCT: 0 % (ref 0–1)
Basophils Absolute: 0.1 10*3/uL (ref 0.0–0.1)
Basophils Absolute: 0 10*3/uL (ref 0.0–0.1)
EOS ABS: 0.4 10*3/uL (ref 0.0–0.7)
EOS ABS: 0.1 10*3/uL (ref 0.0–0.7)
EOS PCT: 1 % (ref 0–5)
Eosinophils Relative: 4 % (ref 0–5)
HCT: 33.3 % — ABNORMAL LOW (ref 36.0–46.0)
HEMATOCRIT: 34.1 % — AB (ref 36.0–46.0)
HEMOGLOBIN: 10.7 g/dL — AB (ref 12.0–15.0)
HEMOGLOBIN: 10.4 g/dL — AB (ref 12.0–15.0)
LYMPHS ABS: 0.8 10*3/uL (ref 0.7–4.0)
Lymphocytes Relative: 9 % — ABNORMAL LOW (ref 12–46)
Lymphocytes Relative: 7 % — ABNORMAL LOW (ref 12–46)
Lymphs Abs: 0.9 10*3/uL (ref 0.7–4.0)
MCH: 30.3 pg (ref 26.0–34.0)
MCH: 30.5 pg (ref 26.0–34.0)
MCHC: 31.4 g/dL (ref 30.0–36.0)
MCHC: 31.2 g/dL (ref 30.0–36.0)
MCV: 96.6 fL (ref 78.0–100.0)
MCV: 97.7 fL (ref 78.0–100.0)
MONOS PCT: 6 % (ref 3–12)
Monocytes Absolute: 0.7 10*3/uL (ref 0.1–1.0)
Monocytes Absolute: 0.8 10*3/uL (ref 0.1–1.0)
Monocytes Relative: 7 % (ref 3–12)
NEUTROS ABS: 7.9 10*3/uL — AB (ref 1.7–7.7)
NEUTROS PCT: 79 % — AB (ref 43–77)
NEUTROS PCT: 86 % — AB (ref 43–77)
Neutro Abs: 10.7 10*3/uL — ABNORMAL HIGH (ref 1.7–7.7)
PLATELETS: 214 10*3/uL (ref 150–400)
Platelets: 200 10*3/uL (ref 150–400)
RBC: 3.53 MIL/uL — ABNORMAL LOW (ref 3.87–5.11)
RBC: 3.41 MIL/uL — ABNORMAL LOW (ref 3.87–5.11)
RDW: 14.1 % (ref 11.5–15.5)
RDW: 14 % (ref 11.5–15.5)
WBC: 9.9 10*3/uL (ref 4.0–10.5)
WBC: 12.5 10*3/uL — ABNORMAL HIGH (ref 4.0–10.5)

## 2015-01-16 LAB — BASIC METABOLIC PANEL
ANION GAP: 6 (ref 5–15)
BUN: 19 mg/dL (ref 6–20)
CO2: 36 mmol/L — ABNORMAL HIGH (ref 22–32)
CREATININE: 0.68 mg/dL (ref 0.44–1.00)
Calcium: 8.7 mg/dL — ABNORMAL LOW (ref 8.9–10.3)
Chloride: 99 mmol/L — ABNORMAL LOW (ref 101–111)
GFR calc Af Amer: >60 mL/min (ref >60)
Glucose, Bld: 111 mg/dL — ABNORMAL HIGH (ref 65–99)
Potassium: 3.3 mmol/L — ABNORMAL LOW (ref 3.5–5.1)
SODIUM: 141 mmol/L (ref 135–145)

## 2015-01-16 LAB — COMPREHENSIVE METABOLIC PANEL
ALBUMIN: 3.2 g/dL — AB (ref 3.5–5.0)
ALT: 11 U/L — ABNORMAL LOW (ref 14–54)
ANION GAP: 9 (ref 5–15)
AST: 21 U/L (ref 15–41)
Alkaline Phosphatase: 48 U/L (ref 38–126)
BUN: 18 mg/dL (ref 6–20)
CALCIUM: 8.6 mg/dL — AB (ref 8.9–10.3)
CHLORIDE: 99 mmol/L — AB (ref 101–111)
CO2: 35 mmol/L — AB (ref 22–32)
Creatinine, Ser: 0.7 mg/dL (ref 0.44–1.00)
GFR calc non Af Amer: >60 mL/min (ref >60)
GLUCOSE: 114 mg/dL — AB (ref 65–99)
POTASSIUM: 3.4 mmol/L — AB (ref 3.5–5.1)
SODIUM: 143 mmol/L (ref 135–145)
Total Bilirubin: 0.8 mg/dL (ref 0.3–1.2)
Total Protein: 5.8 g/dL — ABNORMAL LOW (ref 6.5–8.1)

## 2015-01-16 LAB — PROTIME-INR
INR: 1.07 (ref 0.00–1.49)
PROTHROMBIN TIME: 14.1 s (ref 11.6–15.2)

## 2015-01-16 LAB — ABO/RH: ABO/RH(D): A POS

## 2015-01-16 SURGERY — INSERTION, INTRAMEDULLARY ROD, FEMUR, RETROGRADE
Anesthesia: General | Site: Hip | Laterality: Right

## 2015-01-16 MED ORDER — FENTANYL CITRATE (PF) 250 MCG/5ML IJ SOLN
INTRAMUSCULAR | Status: AC
  Filled 2015-01-16: qty 25

## 2015-01-16 MED ORDER — 0.9 % SODIUM CHLORIDE (POUR BTL) OPTIME
TOPICAL | Status: DC | PRN
  Administered 2015-01-16: 1000 mL

## 2015-01-16 MED ORDER — LACTATED RINGERS IV SOLN
INTRAVENOUS | Status: DC | PRN
  Administered 2015-01-16 (×2): via INTRAVENOUS

## 2015-01-16 MED ORDER — ONDANSETRON HCL 4 MG/2ML IJ SOLN
4.0000 mg | Freq: Three times a day (TID) | INTRAMUSCULAR | Status: AC | PRN
  Administered 2015-01-16: 4 mg via INTRAVENOUS

## 2015-01-16 MED ORDER — FUROSEMIDE 40 MG PO TABS
40.0000 mg | ORAL_TABLET | Freq: Every day | ORAL | Status: DC
  Administered 2015-01-17 – 2015-01-19 (×2): 40 mg via ORAL
  Filled 2015-01-16 (×5): qty 1

## 2015-01-16 MED ORDER — EPHEDRINE SULFATE 50 MG/ML IJ SOLN
INTRAMUSCULAR | Status: DC | PRN
  Administered 2015-01-16 (×2): 5 mg via INTRAVENOUS
  Administered 2015-01-16 (×2): 10 mg via INTRAVENOUS

## 2015-01-16 MED ORDER — GLYCOPYRROLATE 0.2 MG/ML IJ SOLN
INTRAMUSCULAR | Status: DC | PRN
  Administered 2015-01-16: 0.4 mg via INTRAVENOUS

## 2015-01-16 MED ORDER — ASPIRIN EC 325 MG PO TBEC
325.0000 mg | DELAYED_RELEASE_TABLET | Freq: Every day | ORAL | Status: DC
  Administered 2015-01-17 – 2015-01-19 (×3): 325 mg via ORAL
  Filled 2015-01-16 (×4): qty 1

## 2015-01-16 MED ORDER — CEFAZOLIN SODIUM-DEXTROSE 2-3 GM-% IV SOLR
INTRAVENOUS | Status: DC | PRN
Start: 2015-01-16 — End: 2015-01-16
  Administered 2015-01-16: 2 g via INTRAVENOUS

## 2015-01-16 MED ORDER — HYDRALAZINE HCL 50 MG PO TABS
50.0000 mg | ORAL_TABLET | Freq: Three times a day (TID) | ORAL | Status: DC
  Administered 2015-01-17 – 2015-01-19 (×6): 50 mg via ORAL
  Filled 2015-01-16 (×12): qty 1

## 2015-01-16 MED ORDER — PAROXETINE HCL 20 MG PO TABS
20.0000 mg | ORAL_TABLET | Freq: Every day | ORAL | Status: DC
  Administered 2015-01-17 – 2015-01-19 (×3): 20 mg via ORAL
  Filled 2015-01-16 (×4): qty 1

## 2015-01-16 MED ORDER — FLUTICASONE PROPIONATE 50 MCG/ACT NA SUSP
1.0000 | Freq: Every day | NASAL | Status: DC
  Administered 2015-01-17 – 2015-01-19 (×3): 1 via NASAL
  Filled 2015-01-16: qty 16

## 2015-01-16 MED ORDER — MORPHINE SULFATE (PF) 2 MG/ML IV SOLN
0.5000 mg | INTRAVENOUS | Status: DC | PRN
Start: 2015-01-16 — End: 2015-01-19
  Administered 2015-01-17 – 2015-01-18 (×3): 0.5 mg via INTRAVENOUS
  Filled 2015-01-16 (×3): qty 1

## 2015-01-16 MED ORDER — PROPOFOL 10 MG/ML IV BOLUS
INTRAVENOUS | Status: AC
  Filled 2015-01-16: qty 20

## 2015-01-16 MED ORDER — RISAQUAD PO CAPS
1.0000 | ORAL_CAPSULE | Freq: Two times a day (BID) | ORAL | Status: DC
  Administered 2015-01-16 – 2015-01-19 (×6): 1 via ORAL
  Filled 2015-01-16 (×8): qty 1

## 2015-01-16 MED ORDER — TRAMADOL HCL 50 MG PO TABS: 50.0000 mg | ORAL_TABLET | Freq: Four times a day (QID) | ORAL | Status: DC | PRN

## 2015-01-16 MED ORDER — FERROUS SULFATE 325 (65 FE) MG PO TABS
325.0000 mg | ORAL_TABLET | Freq: Every day | ORAL | Status: DC
  Administered 2015-01-17 – 2015-01-19 (×3): 325 mg via ORAL
  Filled 2015-01-16 (×4): qty 1

## 2015-01-16 MED ORDER — HYDROCODONE-ACETAMINOPHEN 5-325 MG PO TABS: 1.0000 | ORAL_TABLET | Freq: Four times a day (QID) | ORAL | Status: DC | PRN

## 2015-01-16 MED ORDER — FUROSEMIDE 20 MG PO TABS
20.0000 mg | ORAL_TABLET | Freq: Every day | ORAL | Status: DC
  Administered 2015-01-17 – 2015-01-18 (×2): 20 mg via ORAL
  Filled 2015-01-16 (×4): qty 1

## 2015-01-16 MED ORDER — CEFAZOLIN SODIUM-DEXTROSE 2-3 GM-% IV SOLR
2.0000 g | Freq: Four times a day (QID) | INTRAVENOUS | Status: AC
Start: 2015-01-16 — End: 2015-01-17
  Administered 2015-01-16 – 2015-01-17 (×2): 2 g via INTRAVENOUS
  Filled 2015-01-16 (×2): qty 50

## 2015-01-16 MED ORDER — FENTANYL CITRATE (PF) 100 MCG/2ML IJ SOLN
50.0000 ug | INTRAMUSCULAR | Status: AC | PRN
  Administered 2015-01-16 (×2): 50 ug via INTRAVENOUS
  Filled 2015-01-16 (×2): qty 2

## 2015-01-16 MED ORDER — SODIUM CHLORIDE 0.9 % IV SOLN
1000.0000 mL | INTRAVENOUS | Status: DC
  Administered 2015-01-16: 1000 mL via INTRAVENOUS

## 2015-01-16 MED ORDER — FLUTICASONE PROPIONATE 50 MCG/ACT NA SUSP: 1.0000 | Freq: Every day | NASAL | Status: DC

## 2015-01-16 MED ORDER — LIDOCAINE HCL (CARDIAC) 20 MG/ML IV SOLN
INTRAVENOUS | Status: DC | PRN
  Administered 2015-01-16: 50 mg via INTRAVENOUS

## 2015-01-16 MED ORDER — FLEET ENEMA 7-19 GM/118ML RE ENEM: 1.0000 | ENEMA | Freq: Once | RECTAL | Status: DC | PRN

## 2015-01-16 MED ORDER — POLYETHYLENE GLYCOL 3350 17 G PO PACK: 17.0000 g | PACK | Freq: Every day | ORAL | Status: DC | PRN

## 2015-01-16 MED ORDER — ACETAMINOPHEN 325 MG PO TABS: 650.0000 mg | ORAL_TABLET | Freq: Four times a day (QID) | ORAL | Status: DC | PRN

## 2015-01-16 MED ORDER — CALCITONIN (SALMON) 200 UNIT/ACT NA SOLN
1.0000 | Freq: Every day | NASAL | Status: DC
  Administered 2015-01-17 – 2015-01-19 (×3): 1 via NASAL
  Filled 2015-01-16: qty 3.7

## 2015-01-16 MED ORDER — ASPIRIN 81 MG PO CHEW: 81.0000 mg | CHEWABLE_TABLET | Freq: Every day | ORAL | Status: DC

## 2015-01-16 MED ORDER — POTASSIUM CHLORIDE CRYS ER 20 MEQ PO TBCR
20.0000 meq | EXTENDED_RELEASE_TABLET | Freq: Every day | ORAL | Status: DC
  Administered 2015-01-17 – 2015-01-19 (×3): 20 meq via ORAL
  Filled 2015-01-16 (×3): qty 1

## 2015-01-16 MED ORDER — LACTATED RINGERS IV SOLN: INTRAVENOUS | Status: DC

## 2015-01-16 MED ORDER — ALUM & MAG HYDROXIDE-SIMETH 200-200-20 MG/5ML PO SUSP: 30.0000 mL | ORAL | Status: DC | PRN

## 2015-01-16 MED ORDER — PROPOFOL 10 MG/ML IV BOLUS
INTRAVENOUS | Status: DC | PRN
  Administered 2015-01-16: 70 mg via INTRAVENOUS

## 2015-01-16 MED ORDER — ROCURONIUM BROMIDE 100 MG/10ML IV SOLN
INTRAVENOUS | Status: DC | PRN
  Administered 2015-01-16: 20 mg via INTRAVENOUS

## 2015-01-16 MED ORDER — PHENYLEPHRINE HCL 10 MG/ML IJ SOLN
INTRAMUSCULAR | Status: DC | PRN
Start: 2015-01-16 — End: 2015-01-16
  Administered 2015-01-16: 80 ug via INTRAVENOUS
  Administered 2015-01-16 (×2): 120 ug via INTRAVENOUS
  Administered 2015-01-16: 80 ug via INTRAVENOUS

## 2015-01-16 MED ORDER — DOCUSATE SODIUM 100 MG PO CAPS
100.0000 mg | ORAL_CAPSULE | Freq: Two times a day (BID) | ORAL | Status: DC
  Administered 2015-01-16 – 2015-01-19 (×6): 100 mg via ORAL
  Filled 2015-01-16 (×6): qty 1

## 2015-01-16 MED ORDER — HYDROMORPHONE HCL 1 MG/ML IJ SOLN: 0.2500 mg | INTRAMUSCULAR | Status: DC | PRN

## 2015-01-16 MED ORDER — FENTANYL CITRATE (PF) 100 MCG/2ML IJ SOLN
INTRAMUSCULAR | Status: DC | PRN
  Administered 2015-01-16: 50 ug via INTRAVENOUS

## 2015-01-16 MED ORDER — FUROSEMIDE 40 MG PO TABS
20.0000 mg | ORAL_TABLET | Freq: Two times a day (BID) | ORAL | Status: DC
  Filled 2015-01-16 (×2): qty 1

## 2015-01-16 MED ORDER — FAMOTIDINE 20 MG PO TABS
20.0000 mg | ORAL_TABLET | Freq: Every day | ORAL | Status: DC
  Administered 2015-01-17 – 2015-01-18 (×2): 20 mg via ORAL
  Filled 2015-01-16 (×4): qty 1

## 2015-01-16 MED ORDER — NEOSTIGMINE METHYLSULFATE 10 MG/10ML IV SOLN
INTRAVENOUS | Status: DC | PRN
  Administered 2015-01-16: 3 mg via INTRAVENOUS

## 2015-01-16 MED ORDER — DIGOXIN 125 MCG PO TABS
0.1250 mg | ORAL_TABLET | Freq: Every day | ORAL | Status: DC
  Administered 2015-01-17 – 2015-01-19 (×3): 0.125 mg via ORAL
  Filled 2015-01-16 (×4): qty 1

## 2015-01-16 MED ORDER — BISACODYL 5 MG PO TBEC: 5.0000 mg | DELAYED_RELEASE_TABLET | Freq: Every day | ORAL | Status: DC | PRN

## 2015-01-16 MED ORDER — ASPIRIN EC 325 MG PO TBEC: 325.0000 mg | DELAYED_RELEASE_TABLET | Freq: Every day | ORAL | Status: DC

## 2015-01-16 MED ORDER — METOPROLOL TARTRATE 25 MG PO TABS
25.0000 mg | ORAL_TABLET | Freq: Two times a day (BID) | ORAL | Status: DC
  Administered 2015-01-17 – 2015-01-19 (×4): 25 mg via ORAL
  Filled 2015-01-16 (×7): qty 1

## 2015-01-16 MED ORDER — SUCCINYLCHOLINE CHLORIDE 20 MG/ML IJ SOLN
INTRAMUSCULAR | Status: DC | PRN
  Administered 2015-01-16: 60 mg via INTRAVENOUS

## 2015-01-16 MED ORDER — CEFAZOLIN SODIUM-DEXTROSE 2-3 GM-% IV SOLR
INTRAVENOUS | Status: AC
  Filled 2015-01-16: qty 50

## 2015-01-16 MED ORDER — ACETAMINOPHEN 500 MG PO TABS: 500.0000 mg | ORAL_TABLET | Freq: Four times a day (QID) | ORAL | Status: DC | PRN

## 2015-01-16 MED ORDER — ACETAMINOPHEN 650 MG RE SUPP: 650.0000 mg | Freq: Four times a day (QID) | RECTAL | Status: DC | PRN

## 2015-01-16 SURGICAL SUPPLY — 39 items
BAG SPEC THK2 15X12 ZIP CLS (MISCELLANEOUS) ×1
BAG ZIPLOCK 12X15 (MISCELLANEOUS) ×3 IMPLANT
BIT DRILL 4.3MMS DISTAL GRDTED (BIT) ×1 IMPLANT
BLADE SURG 15 STRL LF DISP TIS (BLADE) ×1 IMPLANT
BLADE SURG 15 STRL SS (BLADE) ×3
DRAPE ORTHO SPLIT 77X108 STRL (DRAPES) ×6
DRAPE STERI IOBAN 125X83 (DRAPES) ×3 IMPLANT
DRAPE SURG ORHT 6 SPLT 77X108 (DRAPES) ×2 IMPLANT
DRAPE TABLE BACK 44X90 PK DISP (DRAPES) ×3 IMPLANT
DRILL 4.3MMS DISTAL GRADUATED (BIT) ×3
DRSG AQUACEL AG ADV 3.5X 4 (GAUZE/BANDAGES/DRESSINGS) ×9 IMPLANT
DRSG PAD ABDOMINAL 8X10 ST (GAUZE/BANDAGES/DRESSINGS) ×3 IMPLANT
DURAPREP 26ML APPLICATOR (WOUND CARE) ×3 IMPLANT
ELECT PENCIL ROCKER SW 15FT (MISCELLANEOUS) ×3 IMPLANT
ELECT REM PT RETURN 9FT ADLT (ELECTROSURGICAL) ×3
ELECTRODE REM PT RTRN 9FT ADLT (ELECTROSURGICAL) ×1 IMPLANT
GAUZE SPONGE 4X4 12PLY STRL (GAUZE/BANDAGES/DRESSINGS) ×6 IMPLANT
GLOVE BIO SURGEON STRL SZ8 (GLOVE) ×3 IMPLANT
GLOVE ECLIPSE 7.5 STRL STRAW (GLOVE) ×3 IMPLANT
GUIDEPIN 3.2X17.5 THRD DISP (PIN) ×6 IMPLANT
GUIDEWIRE BALL NOSE 100CM (WIRE) ×3 IMPLANT
HFN RH 130 DEG 11MM X 360MM (Orthopedic Implant) ×3 IMPLANT
KIT BASIN OR (CUSTOM PROCEDURE TRAY) ×3 IMPLANT
MANIFOLD NEPTUNE II (INSTRUMENTS) ×3 IMPLANT
NS IRRIG 1000ML POUR BTL (IV SOLUTION) ×3 IMPLANT
PACK GENERAL/GYN (CUSTOM PROCEDURE TRAY) ×3 IMPLANT
POSITIONER SURGICAL ARM (MISCELLANEOUS) ×3 IMPLANT
SCREW BONE CORTICAL 5.0X50 (Screw) ×3 IMPLANT
SCREW LAG 10.5MMX105MM HFN (Screw) ×3 IMPLANT
SET PAD KNEE POSITIONER (MISCELLANEOUS) IMPLANT
STAPLER VISISTAT 35W (STAPLE) ×3 IMPLANT
SUT VIC AB 0 CT1 27 (SUTURE) ×3
SUT VIC AB 0 CT1 27XBRD ANTBC (SUTURE) ×1 IMPLANT
SUT VIC AB 2-0 CT1 27 (SUTURE) ×6
SUT VIC AB 2-0 CT1 TAPERPNT 27 (SUTURE) ×2 IMPLANT
TOWEL OR 17X26 10 PK STRL BLUE (TOWEL DISPOSABLE) ×3 IMPLANT
TRAY FOLEY W/METER SILVER 14FR (SET/KITS/TRAYS/PACK) ×3 IMPLANT
TRAY FOLEY W/METER SILVER 16FR (SET/KITS/TRAYS/PACK) ×3 IMPLANT
WATER STERILE IRR 1500ML POUR (IV SOLUTION) IMPLANT

## 2015-01-16 NOTE — H&P (Signed)
[facility] History and Physical  Jean Murphy NWG:[id number] DOB: [birth year] DOA: 01/16/2015  Referring physician: ER physician: Dr. Noemi Chapel  PCP: Antony Blackbird, MD  Chief Complaint: fall  HPI:  81 year old female with past medical history of chronic diastolic CHF (2 D ECHO in 02/2014 with preserved EF), hypertension, atrial fibrillation not on AC due to risk of falls, depression, hospitalization in 05/2014 for pyelonephritis and aspiration pneumonia. She is from [facility]. Patient presented to [facility] ED status post mechanical fall. She was walking with walker and slipped on the right side. No prodromal symptoms prior to fall such as shortness of breath, palpitations, chest pain, lightheadedness. No reports of abdominal pain, nausea or vomiting. No fevers or chills. No reports of blood in stool urine.   In ED, BP was initially 180/103 but currently 163/69. Blood work notable for hemoglobin 10.7, potassium 3.3, normal creatinine. X ray study revealed right intertrochanteric hip fracture. Ortho plan is to do surgery today. I spoke with cardio on call who recommended pre-operative beta blockers.   Assessment & Plan    Principal Problem:   Fall / Hip fracture, right - Pt is status post mechanical fall and subsequent right intertrochanteric hip fracture - Appreciate orthopedic surgery consult and recommendations  - Plan for surgery today - Continue pain management efforts - I spoke with cardiology Dr. Aundra Dubin who recommended preoperative beta blockers otherwise she is higher risk for surgery considering her multiple comorbidities.   Active Problems:   Atrial fibrillation - CHADS vasc score at least 3 - Not on anticoagulation due to fall risk - Continue metoprolol, digoxin, aspirin    Essential hypertension - Resumed metoprolol, hydralazine    Chronic diastolic heart failure - 2-D echo on 02/26/2014 with a EF of 65-70% with no wall motion abnormalities.  Moderate pulmonary  hypertension. - Compensated - Continue digoxin, lasix, metoprolol    Depression - Resume Paxil    Hypokalemia - Due to lasix - Supplemented    Anemia of chronic disease - Likely from history of atrial fibrillation, aspirin - Hemoglobin stable - No current indications for transfusion     Leukocytosis - No evidence of acute infectious process - CXR with no evidence of pneumonia - Order placed for UA and urine culture     DVT prophylaxis:  - Prophylaxis after ortho surgery per ortho recommendations   Radiological Exams on Admission: Dg Chest 1 View 01/16/2015   Right greater than left pleural effusions and basilar atelectasis.  Cardiomegaly and mild interstitial edema.   Electronically Signed   By: Inge Rise M.D.   On: 01/16/2015 10:29   Dg Shoulder Right 01/16/2015  No acute bony pathology.   Electronically Signed   By: Marybelle Killings M.D.   On: 01/16/2015 10:29   Dg Knee 1-2 Views Right 01/16/2015   No acute abnormality.  Advanced osteoarthritis appears worst medially.   Electronically Signed   By: Inge Rise M.D.   On: 01/16/2015 10:27   Dg Hip Unilat With Pelvis 2-3 Views Right 01/16/2015  Comminuted right intertrochanteric femur fracture.   Electronically Signed   By: Marybelle Killings M.D.   On: 01/16/2015 10:29    EKG: I have personally reviewed EKG. EKG shows atrial fibrillation   Code Status: Full Family Communication: Plan of care discussed with the patient, telemetry  Disposition Plan: Admit for further evaluation  Leisa Lenz, MD  [facility] Pager [phone]  Time spent in minutes: 75 minutes  Review of Systems:  Constitutional: Negative for  fever, chills and malaise/fatigue. Negative for diaphoresis.  HENT: Negative for hearing loss, ear pain, nosebleeds, congestion, sore throat, neck pain, tinnitus and ear discharge.   Eyes: Negative for blurred vision, double vision, photophobia, pain, discharge and redness.  Respiratory: Negative for cough,  hemoptysis, sputum production, shortness of breath, wheezing and stridor.   Cardiovascular: Negative for chest pain, palpitations, orthopnea, claudication and leg swelling.  Gastrointestinal: Negative for nausea, vomiting and abdominal pain. Negative for heartburn, constipation, blood in stool and melena.  Genitourinary: Negative for dysuria, urgency, frequency, hematuria and flank pain.  Musculoskeletal: per HPI Skin: Negative for itching and rash.  Neurological: Negative for dizziness and weakness. Negative for tingling, tremors, sensory change, speech change, focal weakness, loss of consciousness and headaches.  Endo/Heme/Allergies: Negative for environmental allergies and polydipsia. Does not bruise/bleed easily.  Psychiatric/Behavioral: Negative for suicidal ideas. The patient is not nervous/anxious.      Past Medical History  Diagnosis Date  . CHF (congestive heart failure)   . Pleural effusion   . HTN (hypertension)   . Stroke   . Hyperlipidemia   . Diabetes mellitus without complication   . Compound fracture     left leg  . AAA (abdominal aortic aneurysm)   . Eczema   . Compression fracture     x 2   . Hypertension   . Irregular heart beat    Past Surgical History  Procedure Laterality Date  . Cholecystectomy    . Abdominal hysterectomy    . Knee surgery      right knee   . Corneal transplant     Social History:  reports that she has never smoked. She does not have any smokeless tobacco history on file. She reports that she does not drink alcohol or use illicit drugs.  Allergies  Allergen Reactions  . Penicillins     unknown  . Shellfish Allergy     Hives      Family History:  Family History  Problem Relation Age of Onset  . Heart attack Mother 26  . Heart disease Mother      Prior to Admission medications   Medication Sig Start Date End Date Taking? Authorizing Provider  acetaminophen (TYLENOL) 500 MG tablet Take 500 mg by mouth every 6 (six) hours as  needed for mild pain or headache.    Yes Historical Provider, MD  acidophilus (RISAQUAD) CAPS capsule Take 1 capsule by mouth 2 (two) times daily. 06/09/14  Yes Nita Sells, MD  aspirin 81 MG chewable tablet Chew 81 mg by mouth daily.   Yes Historical Provider, MD  calcitonin, salmon, (MIACALCIN/FORTICAL) 200 UNIT/ACT nasal spray Place 1 spray into alternate nostrils daily.   Yes Historical Provider, MD  digoxin (LANOXIN) 0.125 MG tablet Take by mouth daily.   Yes Historical Provider, MD  digoxin (LANOXIN) 0.125 MG tablet Take 0.125 mg by mouth daily.   Yes Historical Provider, MD  docusate sodium (COLACE) 100 MG capsule Take 100 mg by mouth 2 (two) times daily.   Yes Historical Provider, MD  famotidine (PEPCID) 20 MG tablet Take 20 mg by mouth at bedtime.   Yes Historical Provider, MD  fluticasone (FLONASE) 50 MCG/ACT nasal spray Place 1 spray into both nostrils daily.  04/07/14  Yes Historical Provider, MD  fluticasone (FLONASE) 50 MCG/ACT nasal spray Place 1 spray into both nostrils daily.   Yes Historical Provider, MD  furosemide (LASIX) 20 MG tablet Take 20-40 mg by mouth 2 (two) times daily. Take 40 mg in  the morning and 20mg  in the afternoon   Yes Historical Provider, MD  hydrALAZINE (APRESOLINE) 50 MG tablet Take 50 mg by mouth every 8 (eight) hours.   Yes Historical Provider, MD  metoprolol tartrate (LOPRESSOR) 25 MG tablet Take 25 mg by mouth 2 (two) times daily.   Yes Historical Provider, MD  Multiple Vitamins-Minerals (DECUBI-VITE) CAPS Take 1 capsule by mouth daily.   Yes Historical Provider, MD  PARoxetine (PAXIL) 20 MG tablet Take 20 mg by mouth daily.   Yes Historical Provider, MD  potassium chloride SA (K-DUR,KLOR-CON) 20 MEQ tablet Take 20 mEq by mouth daily.   Yes Historical Provider, MD   Physical Exam: Filed Vitals:   01/16/15 0848 01/16/15 1011  BP: 180/103 171/108  Pulse: 67 64  Temp: 98.1 F (36.7 C)   TempSrc: Oral Other (Comment)  Resp: 16 16  SpO2: 99% 97%     Physical Exam  Constitutional: Appears well-developed and well-nourished. No distress.  HENT: Normocephalic. No tonsillar erythema or exudates Eyes: Conjunctivae are normal. No scleral icterus.  Neck: Normal ROM. Neck supple. No JVD. No tracheal deviation. No thyromegaly.  CVS: irregular rhythm, rate controlled, (+) S1, S2  Pulmonary: Effort and breath sounds normal, no stridor, rhonchi, wheezes, rales.  Abdominal: Soft. BS +,  no distension, tenderness, rebound or guarding.  Musculoskeletal: No edema and no tenderness.  Lymphadenopathy: No lymphadenopathy noted, cervical, inguinal. Neuro: Alert. Normal reflexes. No focal neurologic deficits. Skin: Skin is warm and dry. No rash noted.  No erythema. No pallor.  Psychiatric: Normal mood and affect. Behavior, judgment, thought content normal.   Labs on Admission:  Basic Metabolic Panel:  Recent Labs Lab 01/16/15 0922  NA 141  K 3.3*  CL 99*  CO2 36*  GLUCOSE 111*  BUN 19  CREATININE 0.68  CALCIUM 8.7*   Liver Function Tests: No results for input(s): AST, ALT, ALKPHOS, BILITOT, PROT, ALBUMIN in the last 168 hours. No results for input(s): LIPASE, AMYLASE in the last 168 hours. No results for input(s): AMMONIA in the last 168 hours. CBC:  Recent Labs Lab 01/16/15 0922  WBC 9.9  NEUTROABS 7.9*  HGB 10.7*  HCT 34.1*  MCV 96.6  PLT 200   Cardiac Enzymes: No results for input(s): CKTOTAL, CKMB, CKMBINDEX, TROPONINI in the last 168 hours. BNP: Invalid input(s): POCBNP CBG: No results for input(s): GLUCAP in the last 168 hours.  If 7PM-7AM, please contact night-coverage [email or web address] Password [email or web address] 01/16/2015, 11:12 AM

## 2015-01-16 NOTE — Op Note (Signed)
Jean Murphy, Jean Murphy              ACCOUNT NO.:  [id number]  MEDICAL RECORD NO.:  [id number]  LOCATION:  40                         FACILITY:  [facility]  PHYSICIAN:  Alta Corning, M.D.   DATE OF BIRTH:  Jan 15, 1918  DATE OF PROCEDURE:  01/16/2015 DATE OF DISCHARGE:                              OPERATIVE REPORT   PREOPERATIVE DIAGNOSIS:  Comminuted intertrochanteric hip fracture, right.  POSTOPERATIVE DIAGNOSIS:  Comminuted intertrochanteric hip fracture, right.  PROCEDURE:1.  Open reduction and internal fixation of right intertrochanteric hip fracture with an intramedullary nail, size 11 x 360 mm with 105 mm proximal locking screw and a 50 mm distal derotational locking screw. 2.interpretation of multiple intraoperative fluoroscopic images  SURGEON:  Alta Corning, M.D.  ASSISTANT:  Gary Fleet, P.A.  ANESTHESIA:  General.  BRIEF HISTORY:  Mrs. Kozloski is a 81 year old female with a history of falling earlier today.  She suffered intertrochanteric hip fracture. She was evaluated by the Hospitalist Service, Dr. Charlies Silvers who had a brief discussion with Cardiology and felt that she was cleared for surgery and after I had that verbal discussion with her that she was cleared for surgery, she was taken to the operating room for fixation of her intertrochanteric hip fracture.  DESCRIPTION OF PROCEDURE:  The patient was taken to the operating room after adequate anesthesia was obtained with general anesthetic, the patient was placed supine on the operating table.  The right hip was then prepped and draped in usual sterile fashion after she was moved onto the [place] table.  Following this, fluoroscopic images were taken to assess the linear relationship of the femur and then a small incision was made just proximal to the right hip subcutaneous tissue down the level of the greater trochanter and a wire was advanced into the hip under AP and lateral fluoro.  Once this was done, this was  over-reamed with an introductory reamer and following that, the attention was turned towards placement of a guidewire which was placed down in the distal portion of the femur.  Once that was done, the femur was reamed to a level of 13 mm.  Good chatter was we got at 12.5 and 13 and then we had an 11 mm rod down the center of the femur and rotated into place.  Once this was done, the guidewire was advanced into the femur and clearly this was one of those high intertroch where we were not able to get enough off a hole and really the rod tended to more separate the fracture than create a hole for that rod to go down.  Seeing this, we went ahead and put the derotational wire and as well as a wire.  We really wanted to get into the posterior inferior bone which was the better quality bone and so we got the guidewire in there and then put a second derotational guidewire and once that was done, we advanced a screw up into that distal portion of the bone and to good quality bone into the posterior inferior portion of the femoral head and locked this in place.  We did a compression technique, but really we were not able to compress more than what  we were seeing with the comminuted area in the high intertrochanteric area.  Once this was compressed down, it was locked in place and a quarter turn was taken off to allow it to compress and at this point, the attention was turned distally where a freehand distal locking screw was taken, a 50 mm screw was placed at the distal interlocking area for derotational control. Once this was done, the wounds were irrigated, suctioned dry, closed in layers.  Sterile compressive dressing was applied and the patient was taken to recovery room, she was noted to be in satisfactory condition. The estimated blood loss for the procedure was minimal.     Alta Corning, M.D.     Corliss Skains  D:  01/16/2015  T:  01/16/2015  Job:  [id number]  cc:   Alta Corning,  M.D. Fax: [phone]

## 2015-01-16 NOTE — ED Notes (Signed)
Labs obtained. Extra gold tube sent to lab for save. Dark green tube in ED mini lab.

## 2015-01-16 NOTE — ED Provider Notes (Signed)
CSN: [id number]     Arrival date & time 01/16/15  2993 History   First MD Initiated Contact with Patient 01/16/15 0848     Chief Complaint  Patient presents with  . Fall     (Consider location/radiation/quality/duration/timing/severity/associated sxs/prior Treatment) HPI Comments: The patient is a 81 year old female She has hx of CHF, AAA, Htn and Stroke Lives in independent care facility and had fall today when she lost her balance walking - slipped with walker.  She fell on carpeted surface and struck her hip - she had acute onset of pain and couldn't get up - had obvious deformity of the R hip with leg rotation and shortening.  Has associated R shoulder pain as well.  Denies head injury and has no neck pain at this time.  Occurred just pta, sx constant, moderate, wosre with movement of leg.  Patient is a 81 y.o. female presenting with fall. The history is provided by the patient.  Fall    Past Medical History  Diagnosis Date  . CHF (congestive heart failure)   . Pleural effusion   . HTN (hypertension)   . Stroke   . Hyperlipidemia   . Diabetes mellitus without complication   . Compound fracture     left leg  . AAA (abdominal aortic aneurysm)   . Eczema   . Compression fracture     x 2   . Hypertension   . Irregular heart beat    Past Surgical History  Procedure Laterality Date  . Cholecystectomy    . Abdominal hysterectomy    . Knee surgery      right knee   . Corneal transplant     Family History  Problem Relation Age of Onset  . Heart attack Mother 65  . Heart disease Mother    Social History  Substance Use Topics  . Smoking status: Never Smoker   . Smokeless tobacco: None  . Alcohol Use: No   OB History    Gravida Para Term Preterm AB TAB SAB Ectopic Multiple Living   0 0 0 0 0 0 0 0       Review of Systems  All other systems reviewed and are negative.     Allergies  Penicillins and Shellfish allergy  Home Medications   Prior to Admission  medications   Medication Sig Start Date End Date Taking? Authorizing Provider  acetaminophen (TYLENOL) 500 MG tablet Take 500 mg by mouth every 6 (six) hours as needed for mild pain or headache.    Yes Historical Provider, MD  acidophilus (RISAQUAD) CAPS capsule Take 1 capsule by mouth 2 (two) times daily. 06/09/14  Yes Nita Sells, MD  aspirin 81 MG chewable tablet Chew 81 mg by mouth daily.   Yes Historical Provider, MD  calcitonin, salmon, (MIACALCIN/FORTICAL) 200 UNIT/ACT nasal spray Place 1 spray into alternate nostrils daily.   Yes Historical Provider, MD  digoxin (LANOXIN) 0.125 MG tablet Take by mouth daily.   Yes Historical Provider, MD  digoxin (LANOXIN) 0.125 MG tablet Take 0.125 mg by mouth daily.   Yes Historical Provider, MD  docusate sodium (COLACE) 100 MG capsule Take 100 mg by mouth 2 (two) times daily.   Yes Historical Provider, MD  famotidine (PEPCID) 20 MG tablet Take 20 mg by mouth at bedtime.   Yes Historical Provider, MD  fluticasone (FLONASE) 50 MCG/ACT nasal spray Place 1 spray into both nostrils daily.  04/07/14  Yes Historical Provider, MD  fluticasone (FLONASE) 50 MCG/ACT nasal spray  Place 1 spray into both nostrils daily.   Yes Historical Provider, MD  furosemide (LASIX) 20 MG tablet Take 20-40 mg by mouth 2 (two) times daily. Take 40 mg in the morning and 20mg  in the afternoon   Yes Historical Provider, MD  hydrALAZINE (APRESOLINE) 50 MG tablet Take 50 mg by mouth every 8 (eight) hours.   Yes Historical Provider, MD  loperamide (IMODIUM A-D) 2 MG tablet Take 2 mg by mouth 4 (four) times daily as needed for diarrhea or loose stools.   Yes Historical Provider, MD  Menthol-Zinc Oxide (CALMOSEPTINE) 0.44-20.6 % OINT Apply 1 application topically.   Yes Historical Provider, MD  metoprolol tartrate (LOPRESSOR) 25 MG tablet Take 25 mg by mouth 2 (two) times daily.   Yes Historical Provider, MD  Multiple Vitamins-Minerals (DECUBI-VITE) CAPS Take 1 capsule by mouth daily.    Yes Historical Provider, MD  Multiple Vitamins-Minerals (PRESERVISION AREDS PO) Take 1 tablet by mouth 2 (two) times daily.   Yes Historical Provider, MD  PARoxetine (PAXIL) 20 MG tablet Take 20 mg by mouth daily.   Yes Historical Provider, MD  PARoxetine (PAXIL-CR) 25 MG 24 hr tablet Take 25 mg by mouth daily.   Yes Historical Provider, MD  potassium chloride SA (K-DUR,KLOR-CON) 20 MEQ tablet Take 20 mEq by mouth daily.   Yes Historical Provider, MD   BP 163/80 mmHg  Pulse 62  Temp(Src) 98.6 F (37 C) (Oral)  Resp 16  SpO2 99% Physical Exam  Constitutional: She appears well-developed and well-nourished. No distress.  HENT:  Head: Normocephalic and atraumatic.  Mouth/Throat: Oropharynx is clear and moist. No oropharyngeal exudate.  Eyes: Conjunctivae and EOM are normal. Pupils are equal, round, and reactive to light. Right eye exhibits no discharge. Left eye exhibits no discharge. No scleral icterus.  Neck: Normal range of motion. Neck supple. No JVD present. No thyromegaly present.  Cardiovascular: Normal rate, regular rhythm, normal heart sounds and intact distal pulses.  Exam reveals no gallop and no friction rub.   No murmur heard. Normal pulses at the foot  Pulmonary/Chest: Effort normal and breath sounds normal. No respiratory distress. She has no wheezes. She has no rales.  Abdominal: Soft. Bowel sounds are normal. She exhibits no distension and no mass. There is no tenderness.  Musculoskeletal: She exhibits tenderness. She exhibits no edema.  Dec ROM of the R hip 2/2 pain / deformity - has leg shortened and externally rotated.  Lymphadenopathy:    She has no cervical adenopathy.  Neurological: She is alert. Coordination normal.  Normal sensation at the foot  Skin: Skin is warm and dry. No rash noted. No erythema.  Psychiatric: She has a normal mood and affect. Her behavior is normal.  Nursing note and vitals reviewed.   ED Course  Procedures (including critical care  time) Labs Review Labs Reviewed  BASIC METABOLIC PANEL - Abnormal; Notable for the following:    Potassium 3.3 (*)    Chloride 99 (*)    CO2 36 (*)    Glucose, Bld 111 (*)    Calcium 8.7 (*)    All other components within normal limits  CBC WITH DIFFERENTIAL/PLATELET - Abnormal; Notable for the following:    RBC 3.53 (*)    Hemoglobin 10.7 (*)    HCT 34.1 (*)    Neutrophils Relative % 79 (*)    Neutro Abs 7.9 (*)    Lymphocytes Relative 9 (*)    All other components within normal limits  CBC WITH DIFFERENTIAL/PLATELET -  Abnormal; Notable for the following:    WBC 12.5 (*)    RBC 3.41 (*)    Hemoglobin 10.4 (*)    HCT 33.3 (*)    Neutrophils Relative % 86 (*)    Neutro Abs 10.7 (*)    Lymphocytes Relative 7 (*)    All other components within normal limits  COMPREHENSIVE METABOLIC PANEL - Abnormal; Notable for the following:    Potassium 3.4 (*)    Chloride 99 (*)    CO2 35 (*)    Glucose, Bld 114 (*)    Calcium 8.6 (*)    Total Protein 5.8 (*)    Albumin 3.2 (*)    ALT 11 (*)    All other components within normal limits  URINE CULTURE  PROTIME-INR  URINALYSIS, ROUTINE W REFLEX MICROSCOPIC (NOT AT [facility])  TYPE AND SCREEN  ABO/RH    Imaging Review Dg Chest 1 View  01/16/2015   CLINICAL DATA:  Status post fall onto the right side today. Initial encounter.  EXAM: CHEST  1 VIEW  COMPARISON:  PA and lateral chest 08/27/2014 and 06/05/2014.  FINDINGS: There is marked cardiomegaly and mild interstitial edema. Hazy opacity over the right chest is most consistent with a layering pleural effusion. Smaller left pleural effusion is noted. The patient is rotated on the study. Extensive aortic atherosclerosis is seen. Chronic compression fracture in the lower thoracic spine is noted.  IMPRESSION: Right greater than left pleural effusions and basilar atelectasis.  Cardiomegaly and mild interstitial edema.   Electronically Signed   By: Inge Rise M.D.   On: 01/16/2015 10:29   Dg  Shoulder Right  01/16/2015   CLINICAL DATA:  Fall  EXAM: RIGHT SHOULDER - 2+ VIEW  COMPARISON:  None.  FINDINGS: Osteopenia.  No acute fracture.  No dislocation.  IMPRESSION: No acute bony pathology.   Electronically Signed   By: Marybelle Killings M.D.   On: 01/16/2015 10:29   Dg Knee 1-2 Views Right  01/16/2015   CLINICAL DATA:  Status post fall today. Right knee pain. Initial encounter.  EXAM: RIGHT KNEE - 1-2 VIEW  COMPARISON:  None.  FINDINGS: No acute bony or joint abnormality is identified. Advanced osteoarthritis about the knee appears worst in the medial compartment. Small joint effusion is noted. Atherosclerotic calcifications are seen.  IMPRESSION: No acute abnormality.  Advanced osteoarthritis appears worst medially.   Electronically Signed   By: Inge Rise M.D.   On: 01/16/2015 10:27   Dg Hip Unilat With Pelvis 2-3 Views Right  01/16/2015   CLINICAL DATA:  Fall  EXAM: DG HIP (WITH OR WITHOUT PELVIS) 2-3V RIGHT  COMPARISON:  None.  FINDINGS: Comminuted right intertrochanteric femur fracture with varus deformity is noted. Vascular calcifications. Osteopenia. Degenerative changes in the lumbar spine.  IMPRESSION: Comminuted right intertrochanteric femur fracture.   Electronically Signed   By: Marybelle Killings M.D.   On: 01/16/2015 10:29   I have personally reviewed and evaluated these images and lab results as part of my medical decision-making.  The patient has obvious intertrochanteric fracture of the right hip on x-ray  MDM   Final diagnoses:  Hip fracture, right, closed, initial encounter    Pt has obvious hip fracture - imaging ordered, pre op cxr, VS with htn.  Discussed with Dr. Berenice Primas,  He will see the patient in consultation for surgery within the next several hours after he finishes clinic. We'll discuss with the hospitalist for admission.  D/w Dr. Charlies Silvers - holding orders  requested   EKG Interpretation  Date/Time:  Saturday January 16 2015 11:27:39 EDT Ventricular Rate:   77 PR Interval:    QRS Duration: 84 QT Interval:  414 QTC Calculation: 468 R Axis:   -70 Text Interpretation:  Atrial fibrillation Left anterior fascicular block Abnormal R-wave progression, late transition Baseline wander in lead(s) II III aVF since last tracing no significant change Confirmed by Fuller Makin  MD, Zabian Swayne ([id number]) on 01/16/2015 3:19:10 PM        Meds given in ED:  Medications  0.9 %  sodium chloride infusion (1,000 mLs Intravenous New Bag/Given 01/16/15 0914)  ondansetron (ZOFRAN) injection 4 mg (not administered)  famotidine (PEPCID) tablet 20 mg (not administered)  metoprolol tartrate (LOPRESSOR) tablet 25 mg (not administered)  aspirin chewable tablet 81 mg (not administered)  calcitonin (salmon) (MIACALCIN/FORTICAL) nasal spray 1 spray (not administered)  digoxin (LANOXIN) tablet 0.125 mg (not administered)  docusate sodium (COLACE) capsule 100 mg (not administered)  fluticasone (FLONASE) 50 MCG/ACT nasal spray 1 spray (not administered)  PARoxetine (PAXIL) tablet 20 mg (not administered)  potassium chloride SA (K-DUR,KLOR-CON) CR tablet 20 mEq (not administered)  acidophilus (RISAQUAD) capsule 1 capsule (not administered)  acetaminophen (TYLENOL) tablet 500 mg (not administered)  hydrALAZINE (APRESOLINE) tablet 50 mg (not administered)  HYDROcodone-acetaminophen (NORCO/VICODIN) 5-325 MG per tablet 1-2 tablet (not administered)  morphine 2 MG/ML injection 0.5 mg (not administered)  furosemide (LASIX) tablet 20 mg (not administered)    And  furosemide (LASIX) tablet 40 mg (not administered)  fentaNYL (SUBLIMAZE) injection 50 mcg (50 mcg Intravenous Given 01/16/15 1305)        Noemi Chapel, MD 01/16/15 [phone]

## 2015-01-16 NOTE — Consult Note (Signed)
Reason for Consult:Right intertrochanteric hip fracture Referring Physician: Hospitalist  [facility] Jean Murphy is an 81 y.o. female.  HPI: The patient is a 81 year old female who fell earlier today.  She suffered a right intertrochanteric hip fracture.  She is admitted via the medicine service and we are consult for management of her right intertrochanteric hip fracture.  The patient denies previous history of injury to the right hip.  She is a Sales executive.  Past Medical History  Diagnosis Date  . CHF (congestive heart failure)   . Pleural effusion   . HTN (hypertension)   . Stroke   . Hyperlipidemia   . Diabetes mellitus without complication   . Compound fracture     left leg  . AAA (abdominal aortic aneurysm)   . Eczema   . Compression fracture     x 2   . Hypertension   . Irregular heart beat     Past Surgical History  Procedure Laterality Date  . Cholecystectomy    . Abdominal hysterectomy    . Knee surgery      right knee   . Corneal transplant      Family History  Problem Relation Age of Onset  . Heart attack Mother 6  . Heart disease Mother     Social History:  reports that she has never smoked. She does not have any smokeless tobacco history on file. She reports that she does not drink alcohol or use illicit drugs.  Allergies:  Allergies  Allergen Reactions  . Penicillins     unknown  . Shellfish Allergy     Hives      Medications: I have reviewed the patient's current medications.  Results for orders placed or performed during the hospital encounter of 01/16/15 (from the past 48 hour(s))  Basic metabolic panel     Status: Abnormal   Collection Time: 01/16/15  9:22 AM  Result Value Ref Range   Sodium 141 135 - 145 mmol/L   Potassium 3.3 (L) 3.5 - 5.1 mmol/L   Chloride 99 (L) 101 - 111 mmol/L   CO2 36 (H) 22 - 32 mmol/L   Glucose, Bld 111 (H) 65 - 99 mg/dL   BUN 19 6 - 20 mg/dL   Creatinine, Ser 0.68 0.44 - 1.00 mg/dL   Calcium 8.7 (L) 8.9 -  10.3 mg/dL   GFR calc non Af Amer >60 >60 mL/min   GFR calc Af Amer >60 >60 mL/min    Comment: (NOTE) The eGFR has been calculated using the CKD EPI equation. This calculation has not been validated in all clinical situations. eGFR's persistently <60 mL/min signify possible Chronic Kidney Disease.    Anion gap 6 5 - 15  CBC WITH DIFFERENTIAL     Status: Abnormal   Collection Time: 01/16/15  9:22 AM  Result Value Ref Range   WBC 9.9 4.0 - 10.5 K/uL   RBC 3.53 (L) 3.87 - 5.11 MIL/uL   Hemoglobin 10.7 (L) 12.0 - 15.0 g/dL   HCT 34.1 (L) 36.0 - 46.0 %   MCV 96.6 78.0 - 100.0 fL   MCH 30.3 26.0 - 34.0 pg   MCHC 31.4 30.0 - 36.0 g/dL   RDW 14.1 11.5 - 15.5 %   Platelets 200 150 - 400 K/uL   Neutrophils Relative % 79 (H) 43 - 77 %   Neutro Abs 7.9 (H) 1.7 - 7.7 K/uL   Lymphocytes Relative 9 (L) 12 - 46 %   Lymphs Abs 0.9 0.7 -  4.0 K/uL   Monocytes Relative 7 3 - 12 %   Monocytes Absolute 0.7 0.1 - 1.0 K/uL   Eosinophils Relative 4 0 - 5 %   Eosinophils Absolute 0.4 0.0 - 0.7 K/uL   Basophils Relative 1 0 - 1 %   Basophils Absolute 0.1 0.0 - 0.1 K/uL  Protime-INR     Status: None   Collection Time: 01/16/15  9:22 AM  Result Value Ref Range   Prothrombin Time 14.1 11.6 - 15.2 seconds   INR 1.07 0.00 - 1.49  Type and screen     Status: None   Collection Time: 01/16/15  9:22 AM  Result Value Ref Range   ABO/RH(D) A POS    Antibody Screen NEG    Sample Expiration 01/19/2015     Dg Chest 1 View  01/16/2015   CLINICAL DATA:  Status post fall onto the right side today. Initial encounter.  EXAM: CHEST  1 VIEW  COMPARISON:  PA and lateral chest 08/27/2014 and 06/05/2014.  FINDINGS: There is marked cardiomegaly and mild interstitial edema. Hazy opacity over the right chest is most consistent with a layering pleural effusion. Smaller left pleural effusion is noted. The patient is rotated on the study. Extensive aortic atherosclerosis is seen. Chronic compression fracture in the lower  thoracic spine is noted.  IMPRESSION: Right greater than left pleural effusions and basilar atelectasis.  Cardiomegaly and mild interstitial edema.   Electronically Signed   By: Inge Rise M.D.   On: 01/16/2015 10:29   Dg Shoulder Right  01/16/2015   CLINICAL DATA:  Fall  EXAM: RIGHT SHOULDER - 2+ VIEW  COMPARISON:  None.  FINDINGS: Osteopenia.  No acute fracture.  No dislocation.  IMPRESSION: No acute bony pathology.   Electronically Signed   By: Marybelle Killings M.D.   On: 01/16/2015 10:29   Dg Knee 1-2 Views Right  01/16/2015   CLINICAL DATA:  Status post fall today. Right knee pain. Initial encounter.  EXAM: RIGHT KNEE - 1-2 VIEW  COMPARISON:  None.  FINDINGS: No acute bony or joint abnormality is identified. Advanced osteoarthritis about the knee appears worst in the medial compartment. Small joint effusion is noted. Atherosclerotic calcifications are seen.  IMPRESSION: No acute abnormality.  Advanced osteoarthritis appears worst medially.   Electronically Signed   By: Inge Rise M.D.   On: 01/16/2015 10:27   Dg Hip Unilat With Pelvis 2-3 Views Right  01/16/2015   CLINICAL DATA:  Fall  EXAM: DG HIP (WITH OR WITHOUT PELVIS) 2-3V RIGHT  COMPARISON:  None.  FINDINGS: Comminuted right intertrochanteric femur fracture with varus deformity is noted. Vascular calcifications. Osteopenia. Degenerative changes in the lumbar spine.  IMPRESSION: Comminuted right intertrochanteric femur fracture.   Electronically Signed   By: Marybelle Killings M.D.   On: 01/16/2015 10:29    ROS  ROS: I have reviewed the patient's review of systems thoroughly and there are no positive responses as relates to the HPI. EXAM: Blood pressure 171/108, Murphy 64, temperature 98.1 F (36.7 C), temperature source Oral, resp. rate 16, SpO2 97 %. Physical Exam Well-developed well-nourished patient in no acute distress. Alert and oriented x3 HEENT:within normal limits Cardiac: Regular rate and rhythm Pulmonary: Lungs clear to  auscultation Abdomen: Soft and nontender.  Normal active bowel sounds  Musculoskeletal: Right hip: External rotation with mild shortening.  Pain all range of motion.  Neurovascular intact distally.)  Assessment/Plan: A 81 year old female with intertrochanteric hip fracture right side in a patient who is  a Hospital ambulator.//At this point intramedullary rod fixation will be the most appropriate course of action.I have had a prolonged discussion with the patient regarding the risk and benefits of the surgical procedure.  The patient understands the risks include but are not limited to bleeding infection and failure of the surgery to cure the problem and need for further surgery.  The patient understands there is a slight risk of death at the time of surgery.  The patient understands these risks along with the potential benefits and wishes to proceed with surgical intervention.  The patient will be followed in the office in the postoperative period.  Jean Murphy L 01/16/2015, 11:13 AM

## 2015-01-16 NOTE — ED Notes (Addendum)
Pt in from Assisted Living. Was sitting on her rolling walker and it slipped out from under her. She fell on her right side, c/o R leg pain from knee to ankle. EMS reports R leg shortening and rotation. Pt arrives to ED in c-collar due to c/o neck pain. Pt denies neck pain currently. Pt reports she may have "bumped" her head but not sure, no obvious injuries. Pt has not taken her morning meds this am. Hypertensive and rales per ems. Pt not on spine board or KED due to being unable to tolerate it on scene. No blood thinners per MAR.

## 2015-01-16 NOTE — Progress Notes (Signed)
Jean Murphy requested chaplain contact a Roman Catholic priest to provide [name]. She is from [facility] and does not have a single parish she is connected with. This is important to her that a priest comes soon. The [facility] priest on call for August is [place] ([phone]) at 1330 on 16 January 2015. A voice mail was left at the parish.  Jean Murphy cannot take anything by mouth, staff is ready to accommodate both priest and patient is any work around to taking the sacraments.  Page the chaplain if there is no response by evening on 20 August.  Ludy Messamore D. Arsenio Schnorr, DMin, MDiv, MA Chaplain

## 2015-01-16 NOTE — Anesthesia Postprocedure Evaluation (Signed)
  Anesthesia Post-op Note  Patient: Jean Murphy  Procedure(s) Performed: Procedure(s) (LRB): INTRAMEDULLARY (IM) RETROGRADE FEMORAL NAILING (Right)  Patient Location: PACU  Anesthesia Type: General  Level of Consciousness: awake and alert   Airway and Oxygen Therapy: Patient Spontanous Breathing  Post-op Pain: mild  Post-op Assessment: Post-op Vital signs reviewed, Patient's Cardiovascular Status Stable, Respiratory Function Stable, Patent Airway and No signs of Nausea or vomiting  Last Vitals:  Filed Vitals:   01/16/15 1800  BP: 152/83  Pulse:   Temp:   Resp: 17    Post-op Vital Signs: stable   Complications: No apparent anesthesia complications

## 2015-01-16 NOTE — Anesthesia Procedure Notes (Signed)
Procedure Name: Intubation Performed by: Demetruis Depaul J Pre-anesthesia Checklist: Patient identified, Emergency Drugs available, Suction available, Patient being monitored and Timeout performed Patient Re-evaluated:Patient Re-evaluated prior to inductionOxygen Delivery Method: Circle system utilized Preoxygenation: Pre-oxygenation with 100% oxygen Intubation Type: IV induction Ventilation: Mask ventilation without difficulty Laryngoscope Size: Mac and 3 Grade View: Grade I Tube type: Oral Tube size: 7.0 mm Number of attempts: 1 Airway Equipment and Method: Stylet Placement Confirmation: ETT inserted through vocal cords under direct vision,  positive ETCO2,  CO2 detector and breath sounds checked- equal and bilateral Secured at: 21 cm Tube secured with: Tape Dental Injury: Teeth and Oropharynx as per pre-operative assessment        

## 2015-01-16 NOTE — Brief Op Note (Signed)
01/16/2015  5:13 PM  PATIENT:  Jean Murphy  81 y.o. female  PRE-OPERATIVE DIAGNOSIS:  right intertrochanteric hip fracture  POST-OPERATIVE DIAGNOSIS:  RIGHT INTERTROCHANTERIC HIP FRACTURE   PROCEDURE:  Procedure(s): INTRAMEDULLARY (IM) RETROGRADE FEMORAL NAILING (Right)  SURGEON:  Surgeon(s) and Role:    * Dorna Leitz, MD - Primary  PHYSICIAN ASSISTANT:   ASSISTANTS: bethune   ANESTHESIA:   general  EBL:  Total I/O In: 1000 [I.V.:1000] Out: 200 [Blood:200]  BLOOD ADMINISTERED:none  DRAINS: none   LOCAL MEDICATIONS USED:  MARCAINE     SPECIMEN:  No Specimen  DISPOSITION OF SPECIMEN:  N/A  COUNTS:  YES  TOURNIQUET:  * No tourniquets in log *  DICTATION: .Other Dictation: Dictation Number [id number]  PLAN OF CARE: Admit to inpatient   PATIENT DISPOSITION:  PACU - hemodynamically stable.   Delay start of Pharmacological VTE agent (>24hrs) due to surgical blood loss or risk of bleeding: no

## 2015-01-16 NOTE — Transfer of Care (Signed)
Immediate Anesthesia Transfer of Care Note  Patient: Jean Murphy  Procedure(s) Performed: Procedure(s): INTRAMEDULLARY (IM) RETROGRADE FEMORAL NAILING (Right)  Patient Location: PACU  Anesthesia Type:General  Level of Consciousness: sedated, patient cooperative and responds to stimulation  Airway & Oxygen Therapy: Patient Spontanous Breathing and Patient connected to face mask oxygen  Post-op Assessment: Report given to RN and Post -op Vital signs reviewed and stable  Post vital signs: Reviewed and stable  Last Vitals:  Filed Vitals:   01/16/15 1230  BP: 163/80  Pulse: 62  Temp: 37 C  Resp: 16    Complications: No apparent anesthesia complications

## 2015-01-16 NOTE — Progress Notes (Signed)
PT Cancellation Note  Patient Details Name: Jean Murphy MRN: [id number] DOB: May 20, 1918   Cancelled Treatment:    Reason Eval/Treat Not Completed: Patient not medically ready. will sign off at this time. Please reorder after surgery. thanks.    Weston Anna, MPT Pager: [phone]

## 2015-01-16 NOTE — Anesthesia Preprocedure Evaluation (Addendum)
Anesthesia Evaluation  Patient identified by MRN, date of birth, ID band Patient awake    Reviewed: Allergy & Precautions, H&P , NPO status , Patient's Chart, lab work & pertinent test results  Airway Mallampati: II  TM Distance: >3 FB Neck ROM: full    Dental  (+) Dental Advisory Given, Edentulous Upper, Edentulous Lower   Pulmonary neg pulmonary ROS,  breath sounds clear to auscultation  Pulmonary exam normal       Cardiovascular Exercise Tolerance: Poor hypertension, Pt. on home beta blockers and Pt. on medications + Peripheral Vascular Disease and +CHF + dysrhythmias Atrial Fibrillation Rhythm:Irregular Rate:Normal  AAA. ECG AF. Diastolic CHF   Neuro/Psych Depression CVA, No Residual Symptoms negative neurological ROS  negative psych ROS   GI/Hepatic negative GI ROS, Neg liver ROS,   Endo/Other  diabetes, Well Controlled, Type 2Diet controlled DM  Renal/GU negative Renal ROS  negative genitourinary   Musculoskeletal   Abdominal   Peds  Hematology negative hematology ROS (+)   Anesthesia Other Findings   Reproductive/Obstetrics negative OB ROS                           Anesthesia Physical Anesthesia Plan  ASA: IV  Anesthesia Plan: General   Post-op Pain Management:    Induction: Intravenous  Airway Management Planned: Oral ETT  Additional Equipment:   Intra-op Plan:   Post-operative Plan: Extubation in OR  Informed Consent: I have reviewed the patients History and Physical, chart, labs and discussed the procedure including the risks, benefits and alternatives for the proposed anesthesia with the patient or authorized representative who has indicated his/her understanding and acceptance.   Dental Advisory Given  Plan Discussed with: CRNA and Surgeon  Anesthesia Plan Comments:         Anesthesia Quick Evaluation

## 2015-01-16 NOTE — Progress Notes (Signed)
I have reviewed the patient's x-rays and she has an intertrochanteric hip fracture on the right side.  I will plan for open reduction and internal fixation when the patient is cleared by internal medicine in the operating room is available which appears to be around 3:00 today.  I have written and pended a consult note for convenience and will sign this once I have had an opportunity to examine the patient which will be later today.

## 2015-01-16 NOTE — Progress Notes (Signed)
All 1200 and 1400 meds were held due to pre-op and patient being NPO.  MD made aware.

## 2015-01-16 NOTE — ED Notes (Signed)
Patient transported to X-ray 

## 2015-01-16 NOTE — Progress Notes (Signed)
Pt given top dentures

## 2015-01-17 DIAGNOSIS — S72141S Displaced intertrochanteric fracture of right femur, sequela: Secondary | ICD-10-CM

## 2015-01-17 DIAGNOSIS — D62 Acute posthemorrhagic anemia: Secondary | ICD-10-CM

## 2015-01-17 LAB — CBC
HEMATOCRIT: 24.5 % — AB (ref 36.0–46.0)
HEMOGLOBIN: 7.8 g/dL — AB (ref 12.0–15.0)
MCH: 30.8 pg (ref 26.0–34.0)
MCHC: 31.8 g/dL (ref 30.0–36.0)
MCV: 96.8 fL (ref 78.0–100.0)
Platelets: 186 10*3/uL (ref 150–400)
RBC: 2.53 MIL/uL — ABNORMAL LOW (ref 3.87–5.11)
RDW: 14.2 % (ref 11.5–15.5)
WBC: 11 10*3/uL — ABNORMAL HIGH (ref 4.0–10.5)

## 2015-01-17 LAB — BASIC METABOLIC PANEL
Anion gap: 8 (ref 5–15)
BUN: 19 mg/dL (ref 6–20)
CHLORIDE: 104 mmol/L (ref 101–111)
CO2: 31 mmol/L (ref 22–32)
CREATININE: 0.61 mg/dL (ref 0.44–1.00)
Calcium: 8.1 mg/dL — ABNORMAL LOW (ref 8.9–10.3)
GFR calc non Af Amer: >60 mL/min (ref >60)
GLUCOSE: 133 mg/dL — AB (ref 65–99)
Potassium: 3.6 mmol/L (ref 3.5–5.1)
Sodium: 143 mmol/L (ref 135–145)

## 2015-01-17 LAB — PREPARE RBC (CROSSMATCH)

## 2015-01-17 MED ORDER — BOOST / RESOURCE BREEZE PO LIQD
1.0000 | Freq: Three times a day (TID) | ORAL | Status: DC
  Administered 2015-01-17 – 2015-01-19 (×5): 1 via ORAL

## 2015-01-17 MED ORDER — SODIUM CHLORIDE 0.9 % IV SOLN
Freq: Once | INTRAVENOUS | Status: AC
  Administered 2015-01-17: 12:00:00 via INTRAVENOUS

## 2015-01-17 NOTE — Progress Notes (Signed)
OT Cancellation Note  Patient Details Name: Jean Murphy MRN: [id number] DOB: November 12, 1917   Cancelled Treatment:    Reason Eval/Treat Not Completed: Other (comment).  Pt is vomiting at this time.  Will check back tomorrow.  Darina Hartwell 01/17/2015, 10:49 AM  Lesle Chris, OTR/L [phone] 01/17/2015

## 2015-01-17 NOTE — Progress Notes (Signed)
Initial Nutrition Assessment  DOCUMENTATION CODES:   Severe malnutrition in context of chronic illness  INTERVENTION:   Provide Boost Breeze po TID, each supplement provides 250 kcal and 9 grams of protein  Diet advancement per MD Encourage PO intake RD to continue to monitor  NUTRITION DIAGNOSIS:   Malnutrition related to chronic illness as evidenced by severe depletion of body fat, severe depletion of muscle mass.  GOAL:   Patient will meet greater than or equal to 90% of their needs  MONITOR:   PO intake, Supplement acceptance, Diet advancement, Labs, Weight trends, Skin, I & O's  REASON FOR ASSESSMENT:   Consult Assessment of nutrition requirement/status  ASSESSMENT:   81 year old female with past medical history of chronic diastolic CHF (2 D ECHO in 02/2014 with preserved EF), hypertension, atrial fibrillation not on AC due to risk of falls, depression, hospitalization in 05/2014 for pyelonephritis and aspiration pneumonia. She is from SNF. Patient presented to [facility] ED status post mechanical fall.  S/p 8/20 Procedure(s) (LRB): INTRAMEDULLARY (IM) RETROGRADE FEMORAL NAILING (Right)  Pt in room with RN at bedside. Pt has eaten most of her CL tray. Pt states her appetite is good. RN weighed pt during visit. Pt's weight is up by 9 lb since February. Per OT note, pt began vomiting shortly after RD visit.  Pt would benefit from nutritional supplementation, RD to order Boost Breeze.  Nutrition-Focused physical exam completed. Findings are severe fat depletion, severe muscle depletion, and no edema.   Labs reviewed.  Diet Order:  Diet clear liquid Room service appropriate?: Yes; Fluid consistency:: Thin  Skin:  Reviewed, no issues  Last BM:  PTA  Height:   Ht Readings from Last 1 Encounters:  01/17/15 5\' 4"  (1.626 m)    Weight:   Wt Readings from Last 1 Encounters:  01/17/15 140 lb (63.504 kg)    Ideal Body Weight:  54.5 kg  BMI:  Body mass index is 24.02  kg/(m^2).  Estimated Nutritional Needs:   Kcal:  1400-1600  Protein:  75-85g  Fluid:  1.5L/day  EDUCATION NEEDS:   No education needs identified at this time  Clayton Bibles, MS, RD, LDN Pager: [phone] After Hours Pager: [phone]

## 2015-01-17 NOTE — Progress Notes (Signed)
Subjective: 1 Day Post-Op Procedure(s) (LRB): INTRAMEDULLARY (IM) RETROGRADE FEMORAL NAILING (Right) Patient reports pain as mild. The patient is in bed eating breakfast. She is mildly confused, this may be her baseline. States that she has no significant hip pain at rest.  Objective: Vital signs in last 24 hours: Temp:  [96.6 F (35.9 C)-98.6 F (37 C)] 97.4 F (36.3 C) (08/21 0612) Pulse Rate:  [62-95] 83 (08/21 0612) Resp:  [16-27] 18 (08/21 0612) BP: (117-171)/(48-108) 117/56 mmHg (08/21 0612) SpO2:  [94 %-100 %] 95 % (08/21 0612)  Intake/Output from previous day: 08/20 0701 - 08/21 0700 In: 2362 [I.V.:2362] Out: 1650 [Urine:1450; Blood:200] Intake/Output this shift:     Recent Labs  01/16/15 0922 01/16/15 1207 01/17/15 0549  HGB 10.7* 10.4* 7.8*    Recent Labs  01/16/15 1207 01/17/15 0549  WBC 12.5* 11.0*  RBC 3.41* 2.53*  HCT 33.3* 24.5*  PLT 214 186    Recent Labs  01/16/15 1207 01/17/15 0549  NA 143 143  K 3.4* 3.6  CL 99* 104  CO2 35* 31  BUN 18 19  CREATININE 0.70 0.61  GLUCOSE 114* 133*  CALCIUM 8.6* 8.1*    Recent Labs  01/16/15 0922  INR 1.07   Right leg/hip exam: Moderate swelling right thigh as would be expected. Dressings are clean and dry. She moves her foot actively. Her calf is soft and nontender. Intact pulses distally Dorsiflexion/Plantar flexion intact Compartment soft  Assessment/Plan: 1 Day Post-Op Procedure(s) (LRB): INTRAMEDULLARY (IM) RETROGRADE FEMORAL NAILING (Right) Acute blood loss anemia. Expected. Plan: Out of bed with physical therapy to chair 25% partial weightbearing on the right. Aspirin 325 mg enteric-coated 1 daily for DVT prophylaxis along with SCDs. At the patient's age would consider fairly aggressive treatment of her acute blood loss anemia with transfusion. I will leave this up to the hospitalists. We anticipate she will drop a little lower even tomorrow. Will need skilled nursing  facility.   Arden G 01/17/2015, 9:26 AM

## 2015-01-17 NOTE — Evaluation (Addendum)
Physical Therapy Evaluation Patient Details Name: Jean Murphy MRN: [id number] DOB: 1917/07/03 Today's Date: 01/17/2015   History of Present Illness  81 year old female with past medical history of chronic diastolic CHF (2 D ECHO in 02/2014 with preserved EF), hypertension, atrial fibrillation not on AC due to risk of falls, depression, hospitalization in 05/2014 for pyelonephritis and aspiration pneumonia. She is from SNF. Patient presented to [facility] ED with a fall.  Dx of R IT hip fx, s/p ORIF 01/16/15.   Clinical Impression  Pt admitted with above diagnosis. Pt currently with functional limitations due to the deficits listed below (see PT Problem List). +2 total assist for bed to recliner transfer. Pt would benefit from ST-SNF.  Noted congested sounding breaths and productive cough, RN notified.  Pt will benefit from skilled PT to increase their independence and safety with mobility to allow discharge to the venue listed below.       Follow Up Recommendations SNF;Supervision/Assistance - 24 hour    Equipment Recommendations  None recommended by PT    Recommendations for Other Services OT consult     Precautions / Restrictions Precautions Precautions: Fall Restrictions Weight Bearing Restrictions: Yes RLE Weight Bearing: Partial weight bearing RLE Partial Weight Bearing Percentage or Pounds: 25%      Mobility  Bed Mobility Overal bed mobility: +2 for physical assistance;Needs Assistance Bed Mobility: Supine to Sit     Supine to sit: +2 for physical assistance;Total assist     General bed mobility comments: Pt 20%, assist for advancing BLEs and raising trunk  Transfers Overall transfer level: Needs assistance Equipment used: Rolling walker (2 wheeled) Transfers: Sit to/from [employer] Transfers Sit to Stand: +2 physical assistance;Max assist Stand pivot transfers: +2 physical assistance;Max assist       General transfer comment: pt 30%, assist to rise and to  pivot with RW, max cues for PWB status  Ambulation/Gait                Stairs            Wheelchair Mobility    Modified Rankin (Stroke Patients Only)       Balance Overall balance assessment: Needs assistance Sitting-balance support: Feet supported;Bilateral upper extremity supported Sitting balance-Leahy Scale: Fair       Standing balance-Leahy Scale: Poor                               Pertinent Vitals/Pain Pain Assessment: Faces Faces Pain Scale: Hurts even more Pain Location: R hip with movement Pain Intervention(s): Limited activity within patient's tolerance;Monitored during session;Ice applied;Repositioned    Home Living Family/patient expects to be discharged to:: Skilled nursing facility                 Additional Comments: unsure if pt is from ALF or Ind Living-pt was unable to tell therapist    Prior Function Level of Independence: Needs assistance   Gait / Transfers Assistance Needed: supervision for ambulation with rollator bc "my balance isn't good"  ADL's / Homemaking Assistance Needed: assist for bathing        Hand Dominance   Dominant Hand: Right    Extremity/Trunk Assessment   Upper Extremity Assessment: Overall WFL for tasks assessed           Lower Extremity Assessment: RLE deficits/detail RLE Deficits / Details: R hip AAROM decreased 50% limited by pain, R knee extension -3/5, ankle WNL  Cervical / Trunk Assessment: Kyphotic  Communication   Communication: No difficulties  Cognition Arousal/Alertness: Awake/alert Behavior During Therapy: WFL for tasks assessed/performed Overall Cognitive Status: No family/caregiver present to determine baseline cognitive functioning (pt is pleasantly confused)                      General Comments      Exercises Total Joint Exercises Heel Slides: AAROM;Right;10 reps;Supine Hip ABduction/ADduction: AAROM;Right;10 reps;Supine Long Arc Quad:  AAROM;Right;10 reps;Seated      Assessment/Plan    PT Assessment Patient needs continued PT services  PT Diagnosis Difficulty walking;Acute pain   PT Problem List Decreased strength;Decreased range of motion;Decreased activity tolerance;Decreased balance;Pain;Decreased mobility;Decreased knowledge of use of DME;Decreased cognition  PT Treatment Interventions DME instruction;Gait training;Functional mobility training;Therapeutic activities;Patient/family education;Therapeutic exercise;Balance training   PT Goals (Current goals can be found in the Care Plan section) Acute Rehab PT Goals PT Goal Formulation: Patient unable to participate in goal setting Time For Goal Achievement: 01/31/15 Potential to Achieve Goals: Fair    Frequency Min 3X/week   Barriers to discharge        Co-evaluation               End of Session Equipment Utilized During Treatment: Gait belt Activity Tolerance: Patient tolerated treatment well Patient left: in chair;with call bell/phone within reach, chair alarm on Nurse Communication: Mobility status, productive cough         Time: 1206-1226 PT Time Calculation (min) (ACUTE ONLY): 20 min   Charges:   PT Evaluation $Initial PT Evaluation Tier I: 1 Procedure     PT G CodesPhilomena Doheny 01/17/2015, 12:47 PM [phone]

## 2015-01-17 NOTE — Progress Notes (Signed)
Spoke with daughter regarding blood transfusion.  Daughter in aggreement. Virginia Rochester, RN

## 2015-01-17 NOTE — Progress Notes (Addendum)
Patient ID: Jean Murphy, female   DOB: 12-08-1917, 81 y.o.   MRN: [id number] [facility] PROGRESS NOTE  REDITH DRACH OZH:[id number] DOB: [birth year] DOA: 01/16/2015 PCP: Antony Blackbird, MD  Brief narrative:    81 year old female with past medical history of chronic diastolic CHF (2 D ECHO in 02/2014 with preserved EF), hypertension, atrial fibrillation not on AC due to risk of falls, depression, hospitalization in 05/2014 for pyelonephritis and aspiration pneumonia. She is from SNF. Patient presented to [facility] ED status post mechanical fall. She was found to have right intertrochanteric hip fracture and underwent ORIF 01/16/15.   Anticipated discharge: 01/18/2015 to SNF versus home with PT depending on PT eval.    Assessment/Plan:    Principal Problem:  Fall / Hip fracture, right - Pt is status post mechanical fall and subsequent right intertrochanteric hip fracture - Status post ORIF 01/16/15 - Continue pain management efforts - PT eval once able to participate   Active Problems:  Atrial fibrillation - CHADS vasc score at least 3 - Not on anticoagulation due to fall risk - Continue metoprolol, digoxin, aspirin   Essential hypertension - Continue metoprolol, hydralazine   Chronic diastolic heart failure - 2-D echo on 02/26/2014 with a EF of 65-70% with no wall motion abnormalities. Moderate pulmonary hypertension. - Compensated - Continue digoxin, lasix, metoprolol   Depression - Continue Paxil - Stable, not feeling depressed    Hypokalemia - Due to lasix - Supplemented   Acute postoperative blood loss anemia - Likely postoperative after ORIF - Will give 1 units PRBC today - Follow up CBC tomorrow am    Leukocytosis - No evidence of acute infectious process - CXR with no evidence of pneumonia - No GU symptoms     DVT Prophylaxis  - Aspirin per ortho    Code Status: Full.  Family Communication:  plan of care discussed with the  patient Disposition Plan: to SNF likely in next 24 - 48 hours; likely by 01/18/2015  IV access:  Peripheral IV  Procedures and diagnostic studies:    Dg Chest 1 View 01/16/2015 Right greater than left pleural effusions and basilar atelectasis. Cardiomegaly and mild interstitial edema. Electronically Signed By: Inge Rise M.D. On: 01/16/2015 10:29   Dg Shoulder Right 01/16/2015 No acute bony pathology. Electronically Signed By: Marybelle Killings M.D. On: 01/16/2015 10:29   Dg Knee 1-2 Views Right 01/16/2015 No acute abnormality. Advanced osteoarthritis appears worst medially. Electronically Signed By: Inge Rise M.D. On: 01/16/2015 10:27   Dg Hip Unilat With Pelvis 2-3 Views Right 01/16/2015 Comminuted right intertrochanteric femur fracture. Electronically Signed By: Marybelle Killings M.D. On: 01/16/2015 10:29   Dg Femur Port, Min 2 Views Right 01/16/2015  ORIF of the comminuted intertrochanteric right femoral neck fracture.   Electronically Signed   By: Evangeline Dakin M.D.   On: 01/16/2015 17:23    Medical Consultants:  Orthopedic surgery  Other Consultants:  PT Nutrition  IAnti-Infectives:   None    Leisa Lenz, MD  [facility] Pager [phone]  Time spent in minutes: 25 minutes  If 7PM-7AM, please contact night-coverage [email or web address] Password [email or web address] 01/17/2015, 12:30 PM   LOS: 1 day    HPI/Subjective: No acute overnight events. Patient reports no pain this am.   Objective: Filed Vitals:   01/16/15 2136 01/17/15 0612 01/17/15 1106 01/17/15 1113  BP: 118/95 117/56    Pulse: 74 83    Temp: 97.8 F (36.6 C) 97.4 F (36.3 C)    TempSrc: Oral Oral  Resp: 18 18    Height:    5\' 4"  (1.626 m)  Weight:   63.504 kg (140 lb)   SpO2: 98% 95%      Intake/Output Summary (Last 24 hours) at 01/17/15 1230 Last data filed at 01/17/15 1059  Gross per 24 hour  Intake   2722 ml  Output   1650 ml  Net   1072 ml    Exam:   General:   Pt is alert, follows commands appropriately, not in acute distress  Cardiovascular: Regular rate and rhythm, S1/S2, no murmurs  Respiratory: Clear to auscultation bilaterally, no wheezing, no crackles, no rhonchi  Abdomen: Soft, non tender, non distended, bowel sounds present  Extremities: No edema, pulses palpable bilaterally  Neuro: Grossly nonfocal  Data Reviewed: Basic Metabolic Panel:  Recent Labs Lab 01/16/15 0922 01/16/15 1207 01/17/15 0549  NA 141 143 143  K 3.3* 3.4* 3.6  CL 99* 99* 104  CO2 36* 35* 31  GLUCOSE 111* 114* 133*  BUN 19 18 19   CREATININE 0.68 0.70 0.61  CALCIUM 8.7* 8.6* 8.1*   Liver Function Tests:  Recent Labs Lab 01/16/15 1207  AST 21  ALT 11*  ALKPHOS 48  BILITOT 0.8  PROT 5.8*  ALBUMIN 3.2*   No results for input(s): LIPASE, AMYLASE in the last 168 hours. No results for input(s): AMMONIA in the last 168 hours. CBC:  Recent Labs Lab 01/16/15 0922 01/16/15 1207 01/17/15 0549  WBC 9.9 12.5* 11.0*  NEUTROABS 7.9* 10.7*  --   HGB 10.7* 10.4* 7.8*  HCT 34.1* 33.3* 24.5*  MCV 96.6 97.7 96.8  PLT 200 214 186   Cardiac Enzymes: No results for input(s): CKTOTAL, CKMB, CKMBINDEX, TROPONINI in the last 168 hours. BNP: Invalid input(s): POCBNP CBG: No results for input(s): GLUCAP in the last 168 hours.  No results found for this or any previous visit (from the past 240 hour(s)).   Scheduled Meds: . sodium chloride   Intravenous Once  . acidophilus  1 capsule Oral BID  . aspirin EC  325 mg Oral Q breakfast  . calcitonin (salmon)  1 spray Alternating Nares Daily  . digoxin  0.125 mg Oral Daily  . docusate sodium  100 mg Oral BID  . famotidine  20 mg Oral QHS  . feeding supplement  1 Container Oral TID BM  . ferrous sulfate  325 mg Oral Q breakfast  . fluticasone  1 spray Each Nare Daily  . furosemide  20 mg Oral q1800   And  . furosemide  40 mg Oral Q breakfast  . hydrALAZINE  50 mg Oral 3 times per day  . metoprolol  tartrate  25 mg Oral BID  . PARoxetine  20 mg Oral Daily  . potassium chloride SA  20 mEq Oral Daily   Continuous Infusions: . sodium chloride 1,000 mL (01/17/15 0500)

## 2015-01-18 ENCOUNTER — Encounter (HOSPITAL_COMMUNITY): Payer: Self-pay | Admitting: Orthopedic Surgery

## 2015-01-18 LAB — BASIC METABOLIC PANEL
Anion gap: 8 (ref 5–15)
BUN: 15 mg/dL (ref 6–20)
CHLORIDE: 102 mmol/L (ref 101–111)
CO2: 32 mmol/L (ref 22–32)
CREATININE: 0.56 mg/dL (ref 0.44–1.00)
Calcium: 7.9 mg/dL — ABNORMAL LOW (ref 8.9–10.3)
GFR calc Af Amer: >60 mL/min (ref >60)
GFR calc non Af Amer: >60 mL/min (ref >60)
GLUCOSE: 121 mg/dL — AB (ref 65–99)
POTASSIUM: 3.2 mmol/L — AB (ref 3.5–5.1)
SODIUM: 142 mmol/L (ref 135–145)

## 2015-01-18 LAB — TYPE AND SCREEN
ABO/RH(D): A POS
Antibody Screen: NEGATIVE
Unit division: 0

## 2015-01-18 LAB — CBC WITH DIFFERENTIAL/PLATELET
Basophils Absolute: 0 10*3/uL (ref 0.0–0.1)
Basophils Relative: 0 % (ref 0–1)
EOS ABS: 0.1 10*3/uL (ref 0.0–0.7)
EOS PCT: 0 % (ref 0–5)
HCT: 25.6 % — ABNORMAL LOW (ref 36.0–46.0)
HEMOGLOBIN: 8.4 g/dL — AB (ref 12.0–15.0)
LYMPHS ABS: 1 10*3/uL (ref 0.7–4.0)
LYMPHS PCT: 8 % — AB (ref 12–46)
MCH: 31.6 pg (ref 26.0–34.0)
MCHC: 32.8 g/dL (ref 30.0–36.0)
MCV: 96.2 fL (ref 78.0–100.0)
MONOS PCT: 9 % (ref 3–12)
Monocytes Absolute: 1.1 10*3/uL — ABNORMAL HIGH (ref 0.1–1.0)
NEUTROS PCT: 83 % — AB (ref 43–77)
Neutro Abs: 9.8 10*3/uL — ABNORMAL HIGH (ref 1.7–7.7)
Platelets: 154 10*3/uL (ref 150–400)
RBC: 2.66 MIL/uL — AB (ref 3.87–5.11)
RDW: 14.8 % (ref 11.5–15.5)
WBC: 12 10*3/uL — ABNORMAL HIGH (ref 4.0–10.5)

## 2015-01-18 MED ORDER — FUROSEMIDE 10 MG/ML IJ SOLN
20.0000 mg | Freq: Once | INTRAMUSCULAR | Status: AC
  Administered 2015-01-18: 20 mg via INTRAVENOUS
  Filled 2015-01-18: qty 2

## 2015-01-18 MED ORDER — POTASSIUM CHLORIDE CRYS ER 20 MEQ PO TBCR
40.0000 meq | EXTENDED_RELEASE_TABLET | Freq: Once | ORAL | Status: AC
  Administered 2015-01-18: 40 meq via ORAL
  Filled 2015-01-18: qty 2

## 2015-01-18 MED ORDER — IPRATROPIUM-ALBUTEROL 0.5-2.5 (3) MG/3ML IN SOLN
3.0000 mL | Freq: Four times a day (QID) | RESPIRATORY_TRACT | Status: DC | PRN
  Administered 2015-01-18: 3 mL via RESPIRATORY_TRACT
  Filled 2015-01-18: qty 3

## 2015-01-18 NOTE — Progress Notes (Addendum)
Patient ID: Jean Murphy, female   DOB: 04-17-1918, 81 y.o.   MRN: [id number] [facility] PROGRESS NOTE  PAYTIN RAMAKRISHNAN ONG:[id number] DOB: [birth year] DOA: 01/16/2015 PCP: Antony Blackbird, MD  Brief narrative:    81 year old female with past medical history of chronic diastolic CHF (2 D ECHO in 02/2014 with preserved EF), hypertension, atrial fibrillation not on AC due to risk of falls, depression, hospitalization in 05/2014 for pyelonephritis and aspiration pneumonia. She is from SNF. Patient presented to [facility] ED status post mechanical fall. She was found to have right intertrochanteric hip fracture and underwent ORIF 01/16/15.   Anticipated discharge: To SNF by 01/19/2015.   Assessment/Plan:    Principal Problem:  Fall / Hip fracture, right - Pt is status post mechanical fall and subsequent right intertrochanteric hip fracture - Status post ORIF 01/16/15 - Patient reports no pain this morning. - Discharge to skilled nursing facility likely in next 24 hours.   Active Problems:  Atrial fibrillation - CHADS vasc score at least 3 - Not on anticoagulation due to fall risk - Continue metoprolol, digoxin, aspirin   Essential hypertension - Continue metoprolol, hydralazine   Chronic diastolic heart failure - 2-D echo on 02/26/2014 with a EF of 65-70% with no wall motion abnormalities. Moderate pulmonary hypertension. - Compensated - Continue digoxin and metoprolol - We will give Lasix 20 mg IV 2 doses today.   Depression - Continue Paxil - Stable   Hypokalemia - Secondary to Lasix. Supplemented.   Acute postoperative blood loss anemia - Likely postoperative after ORIF - Hemoglobin as low as 7.8 after surgery. - Patient is status post 1 unit of PRBC on 01/17/2015 - Hemoglobin this morning 8.4   Leukocytosis - No evidence of acute infectious process - CXR with no evidence of pneumonia    DVT Prophylaxis  - Aspirin per ortho    Code Status: Full.  Family  Communication:  plan of care discussed with the patient Disposition Plan: to SNF likely by 01/19/2015; will get palliative care consult to address goals of care   IV access:  Peripheral IV  Procedures and diagnostic studies:    Dg Chest 1 View 01/16/2015 Right greater than left pleural effusions and basilar atelectasis. Cardiomegaly and mild interstitial edema. Electronically Signed By: Inge Rise M.D. On: 01/16/2015 10:29   Dg Shoulder Right 01/16/2015 No acute bony pathology. Electronically Signed By: Marybelle Killings M.D. On: 01/16/2015 10:29   Dg Knee 1-2 Views Right 01/16/2015 No acute abnormality. Advanced osteoarthritis appears worst medially. Electronically Signed By: Inge Rise M.D. On: 01/16/2015 10:27   Dg Hip Unilat With Pelvis 2-3 Views Right 01/16/2015 Comminuted right intertrochanteric femur fracture. Electronically Signed By: Marybelle Killings M.D. On: 01/16/2015 10:29   Dg Femur Port, Min 2 Views Right 01/16/2015  ORIF of the comminuted intertrochanteric right femoral neck fracture.   Electronically Signed   By: Evangeline Dakin M.D.   On: 01/16/2015 17:23    Medical Consultants:  Orthopedic surgery Palliative care   Other Consultants:  PT Nutrition  IAnti-Infectives:   None    Leisa Lenz, MD  [facility] Pager [phone]  Time spent in minutes: 25 minutes  If 7PM-7AM, please contact night-coverage [email or web address] Password [facility] 01/18/2015, 12:40 PM   LOS: 2 days    HPI/Subjective: No acute overnight events. Patient reports no respiratory distress.   Objective: Filed Vitals:   01/17/15 1610 01/17/15 2128 01/18/15 0153 01/18/15 0522  BP: 152/57 142/72 132/69 156/67  Pulse: 88 91 86 100  Temp: 98.4  F (36.9 C) 98.1 F (36.7 C) 98.6 F (37 C) 98.5 F (36.9 C)  TempSrc: Oral Oral Oral Oral  Resp: 18 18 18 18   Height:      Weight:      SpO2: 98% 100% 93% 92%    Intake/Output Summary (Last 24 hours) at 01/18/15  1240 Last data filed at 01/18/15 0514  Gross per 24 hour  Intake 1189.2 ml  Output   1550 ml  Net -360.8 ml    Exam:   General:  Pt is alert,  not in acute distress  Cardiovascular: Regular rate and rhythm, S1/S2 (+)  Respiratory: diminished breath sound but no wheezing   Abdomen: Soft, non tender, non distended, bowel sounds present  Extremities: No leg swelling, pulses palpable   Neuro: Nonfocal  Data Reviewed: Basic Metabolic Panel:  Recent Labs Lab 01/16/15 0922 01/16/15 1207 01/17/15 0549 01/18/15 0830  NA 141 143 143 142  K 3.3* 3.4* 3.6 3.2*  CL 99* 99* 104 102  CO2 36* 35* 31 32  GLUCOSE 111* 114* 133* 121*  BUN 19 18 19 15   CREATININE 0.68 0.70 0.61 0.56  CALCIUM 8.7* 8.6* 8.1* 7.9*   Liver Function Tests:  Recent Labs Lab 01/16/15 1207  AST 21  ALT 11*  ALKPHOS 48  BILITOT 0.8  PROT 5.8*  ALBUMIN 3.2*   No results for input(s): LIPASE, AMYLASE in the last 168 hours. No results for input(s): AMMONIA in the last 168 hours. CBC:  Recent Labs Lab 01/16/15 0922 01/16/15 1207 01/17/15 0549 01/18/15 0830  WBC 9.9 12.5* 11.0* 12.0*  NEUTROABS 7.9* 10.7*  --  9.8*  HGB 10.7* 10.4* 7.8* 8.4*  HCT 34.1* 33.3* 24.5* 25.6*  MCV 96.6 97.7 96.8 96.2  PLT 200 214 186 154   Cardiac Enzymes: No results for input(s): CKTOTAL, CKMB, CKMBINDEX, TROPONINI in the last 168 hours. BNP: Invalid input(s): POCBNP CBG: No results for input(s): GLUCAP in the last 168 hours.  No results found for this or any previous visit (from the past 240 hour(s)).   Scheduled Meds: . acidophilus  1 capsule Oral BID  . aspirin EC  325 mg Oral Q breakfast  . calcitonin (salmon)  1 spray Alternating Nares Daily  . digoxin  0.125 mg Oral Daily  . docusate sodium  100 mg Oral BID  . famotidine  20 mg Oral QHS  . feeding supplement  1 Container Oral TID BM  . ferrous sulfate  325 mg Oral Q breakfast  . fluticasone  1 spray Each Nare Daily  . furosemide  20 mg Oral  q1800   And  . furosemide  40 mg Oral Q breakfast  . hydrALAZINE  50 mg Oral 3 times per day  . metoprolol tartrate  25 mg Oral BID  . PARoxetine  20 mg Oral Daily  . potassium chloride SA  20 mEq Oral Daily  . potassium chloride  40 mEq Oral Once   Continuous Infusions:

## 2015-01-18 NOTE — Progress Notes (Signed)
Subjective: 2 Days Post-Op Procedure(s) (LRB): INTRAMEDULLARY (IM) RETROGRADE FEMORAL NAILING (Right) Patient reports pain as moderate.    Objective: Vital signs in last 24 hours: Temp:  [97.6 F (36.4 C)-99.6 F (37.6 C)] 98.5 F (36.9 C) (08/22 0522) Pulse Rate:  [83-100] 100 (08/22 0522) Resp:  [18-20] 18 (08/22 0522) BP: (111-156)/(51-72) 156/67 mmHg (08/22 0522) SpO2:  [92 %-100 %] 92 % (08/22 0522) Weight:  [140 lb (63.504 kg)] 140 lb (63.504 kg) (08/21 1106)  Intake/Output from previous day: 08/21 0701 - 08/22 0700 In: 1549.2 [P.O.:480; I.V.:734.2; Blood:335] Out: 1550 [Urine:1550] Intake/Output this shift:     Recent Labs  01/16/15 0922 01/16/15 1207 01/17/15 0549  HGB 10.7* 10.4* 7.8*    Recent Labs  01/16/15 1207 01/17/15 0549  WBC 12.5* 11.0*  RBC 3.41* 2.53*  HCT 33.3* 24.5*  PLT 214 186    Recent Labs  01/16/15 1207 01/17/15 0549  NA 143 143  K 3.4* 3.6  CL 99* 104  CO2 35* 31  BUN 18 19  CREATININE 0.70 0.61  GLUCOSE 114* 133*  CALCIUM 8.6* 8.1*    Recent Labs  01/16/15 0922  INR 1.07    Neurologically intact ABD soft Neurovascular intact Sensation intact distally Intact pulses distally Dorsiflexion/Plantar flexion intact No cellulitis present Compartment soft  Assessment/Plan: 2 Days Post-Op Procedure(s) (LRB): INTRAMEDULLARY (IM) RETROGRADE FEMORAL NAILING (Right) Advance diet Up with therapy Discharge to SNF  The patient has acute blood loss anemia with no CBC ordered today after 1 unit transfusion yesterday I have ordered a CBC today to make sure she has adequate hemoglobin.  Soo Steelman L 01/18/2015, 8:22 AM

## 2015-01-18 NOTE — Clinical Social Work Note (Signed)
Clinical Social Work Assessment  Patient Details  Name: Jean Murphy MRN: [id number] Date of Birth: September 10, 1917  Date of referral:  01/18/15               Reason for consult:  Discharge Planning                Permission sought to share information with:  Family Supports Permission granted to share information::  Yes, Verbal Permission Granted  Name::     Music therapist::     Relationship::  dtr  Contact Information:     Housing/Transportation Living arrangements for the past 2 months:  [place] of Information:  Adult Children Patient Interpreter Needed:  None Criminal Activity/Legal Involvement Pertinent to Current Situation/Hospitalization:  No - Comment as needed Significant Relationships:  Adult Children Lives with:  Facility Resident Do you feel safe going back to the place where you live?  No Need for family participation in patient care:  Yes (Comment)  Care giving concerns:  From ALF but will need SNF placement due to fall.   Social Worker assessment / plan:  CSW received referral in order to assist with DC planning. CSW reviewed chart and met with patient at bedside. CSW introduced myself and explained role. Patient drowsy and unable to fully participate so CSW contacted dtr.   Patient has been living at [facility] since March 2016. Patient fell and broke her hip and needs SNF placement now. Patient has been to [facility] in the past and dtr prefers placement at [place] again. CSW left SNF list in room with CSW contact information and encouraged dtr to have alternative options in case [place] is unavailable.  CSW completed FL2 and faxed out.  Employment status:  Retired Nurse, adult PT Recommendations:  [place] / Referral to community resources:  [place]  Patient/Family's Response to care:  Patient unable to participate in assessment. Dtr engaged and  appreciative of call.  Patient/Family's Understanding of and Emotional Response to Diagnosis, Current Treatment, and Prognosis:  Dtr reports she is concerned about LT plans. Patient has gone to SNF in the past and been able to return to ALF but is unsure if she will be able to ambulate on her own again. Dtr reports she will wait to see how patient progresses and make final decision re: ALF vs SNF in a few weeks.  Emotional Assessment Appearance:  Appears stated age Attitude/Demeanor/Rapport:  Lethargic Affect (typically observed):  Flat Orientation:  Oriented to Self Alcohol / Substance use:  Not Applicable Psych involvement (Current and /or in the community):  No (Comment)  Discharge Needs  Concerns to be addressed:  No discharge needs identified Readmission within the last 30 days:  No Current discharge risk:  None Barriers to Discharge:  No Barriers Identified   Boone Master, [place] 01/18/2015, 3:43 PM [phone]

## 2015-01-18 NOTE — Care Management Note (Signed)
Case Management Note  Patient Details  Name: Jean Murphy MRN: [id number] Date of Birth: 08-13-1917  Subjective/Objective:            Open reduction and internal fixation of right intertrochanteric hip fracture with an intramedullary nail        Action/Plan: Discharge planning  Expected Discharge Date:                  Expected Discharge Plan:  [place]  In-House Referral:  Clinical Social Work  Discharge planning Services  CM Consult  Post Acute Care Choice:  NA Choice offered to:  NA  DME Arranged:  N/A DME Agency:  NA  HH Arranged:  NA HH Agency:  NA  Status of Service:  Completed, signed off  Medicare Important Message Given:    Date Medicare IM Given:    Medicare IM give by:    Date Additional Medicare IM Given:    Additional Medicare Important Message give by:     If discussed at [place] of Stay Meetings, dates discussed:    Additional Comments:  Guadalupe Maple, RN 01/18/2015, 8:34 AM

## 2015-01-18 NOTE — Progress Notes (Addendum)
Pt lung sound congested, MD notified, telephone orders placed. D/C IVF and 20 mg Lasix Injection, hold scheduled PO lasix. Kizzie Ide, RN

## 2015-01-18 NOTE — Progress Notes (Signed)
Physical Therapy Treatment Patient Details Name: Jean Murphy MRN: [id number] DOB: 1917/07/15 Today's Date: 01/18/2015    History of Present Illness 81 year old female with past medical history of chronic diastolic CHF (2 D ECHO in 02/2014 with preserved EF), hypertension, atrial fibrillation not on AC due to risk of falls, depression, hospitalization in 05/2014 for pyelonephritis and aspiration pneumonia. She is from [place]. Patient presented to [facility] ED with a fall.  Dx of R IT hip fx, s/p ORIF 01/16/15.     PT Comments    Progressing slowly with mobility. Continues to require +2 assist. Recommend SNF.   Follow Up Recommendations  SNF     Equipment Recommendations  None recommended by PT    Recommendations for Other Services OT consult     Precautions / Restrictions Precautions Precautions: Fall Restrictions Weight Bearing Restrictions: Yes RLE Weight Bearing: Partial weight bearing RLE Partial Weight Bearing Percentage or Pounds: 25%    Mobility  Bed Mobility Overal bed mobility: Needs Assistance Bed Mobility: Supine to Sit;Sit to Supine     Supine to sit: Max assist;+2 for physical assistance;+2 for safety/equipment;HOB elevated Sit to supine: Max assist;+2 for physical assistance;+2 for safety/equipment;HOB elevated   General bed mobility comments: Assist for trunk and bil LEs. Utilized bedpad for scooting, positioning. Multimodal cues for participation, technique.   Transfers                 General transfer comment: did not perform today-pt fatigued, requesting to return to bed  Ambulation/Gait                 Stairs            Wheelchair Mobility    Modified Rankin (Stroke Patients Only)       Balance   Sitting-balance support: Bilateral upper extremity supported;Feet supported Sitting balance-Leahy Scale: Poor Sitting balance - Comments: Initially, repeated LOB in posterior direction. Eventually progressed to Min  guard assist for static sitting balance Postural control: Posterior lean                          Cognition Arousal/Alertness: Awake/alert Behavior During Therapy: WFL for tasks assessed/performed Overall Cognitive Status: No family/caregiver present to determine baseline cognitive functioning                      Exercises General Exercises - Lower Extremity Ankle Circles/Pumps: AROM;Both;10 reps;Supine Long Arc Quad: AROM;AAROM;10 reps;Left;Right;5 reps Heel Slides: AAROM;Both;5 reps;Supine Hip ABduction/ADduction: AAROM;Left;5 reps;Supine    General Comments        Pertinent Vitals/Pain Pain Assessment: Faces Pain Score: 4  Faces Pain Scale: Hurts even more Pain Location: R hip/LE with movement Pain Descriptors / Indicators: Grimacing;Guarding;Sore Pain Intervention(s): Monitored during session;Limited activity within patient's tolerance;Repositioned    Home Living Family/patient expects to be discharged to:: Skilled nursing facility                    Prior Function            PT Goals (current goals can now be found in the care plan section) Acute Rehab PT Goals Patient Stated Goal: did not state Progress towards PT goals: Progressing toward goals    Frequency  Min 3X/week    PT Plan Current plan remains appropriate    Co-evaluation             End of Session   Activity Tolerance: Patient limited  by fatigue;Patient limited by pain Patient left: in bed;with call bell/phone within reach;with bed alarm set     Time: 1420-1433 PT Time Calculation (min) (ACUTE ONLY): 13 min  Charges:  $Therapeutic Activity: 8-22 mins                    G Codes:      Weston Anna, MPT Pager: [phone]

## 2015-01-18 NOTE — Clinical Social Work Placement (Signed)
   CLINICAL SOCIAL WORK PLACEMENT  NOTE  Date:  01/18/2015  Patient Details  Name: Jean Murphy MRN: [id number] Date of Birth: 1917/07/07  Clinical Social Work is seeking post-discharge placement for this patient at the [place] level of care (*CSW will initial, date and re-position this form in  chart as items are completed):  Yes   Patient/family provided with [facility] Work Department's list of facilities offering this level of care within the geographic area requested by the patient (or if unable, by the patient's family).  Yes   Patient/family informed of their freedom to choose among providers that offer the needed level of care, that participate in Medicare, Medicaid or managed care program needed by the patient, have an available bed and are willing to accept the patient.  Yes   Patient/family informed of [facility]'s ownership interest in [facility] and [facility], as well as of the fact that they are under no obligation to receive care at these facilities.  PASRR submitted to EDS on       PASRR number received on       Existing PASRR number confirmed on 01/18/15     FL2 transmitted to all facilities in geographic area requested by pt/family on 01/18/15     FL2 transmitted to all facilities within larger geographic area on       Patient informed that his/her managed care company has contracts with or will negotiate with certain facilities, including the following:            Patient/family informed of bed offers received.  Patient chooses bed at       Physician recommends and patient chooses bed at      Patient to be transferred to   on  .  Patient to be transferred to facility by       Patient family notified on   of transfer.  Name of family member notified:        PHYSICIAN       Additional Comment:    _______________________________________________ Boone Master, [place] 01/18/2015, 3:53 PM

## 2015-01-18 NOTE — Evaluation (Signed)
Occupational Therapy Evaluation Patient Details Name: Jean Murphy MRN: [id number] DOB: [birth year] Today's Date: 01/18/2015    History of Present Illness 81 year old female with past medical history of chronic diastolic CHF (2 D ECHO in 02/2014 with preserved EF), hypertension, atrial fibrillation not on AC due to risk of falls, depression, hospitalization in 05/2014 for pyelonephritis and aspiration pneumonia. She is from [place]. Patient presented to [facility] ED with a fall.  Dx of R IT hip fx, s/p ORIF 01/16/15.    Clinical Impression   Pt admitted with fall. Pt currently with functional limitations due to the deficits listed below (see OT Problem List).  Pt will benefit from skilled OT to increase their safety and independence with ADL and functional mobility for ADL to facilitate discharge to venue listed below.      Follow Up Recommendations  SNF          Precautions / Restrictions Restrictions Weight Bearing Restrictions: Yes RLE Weight Bearing: Partial weight bearing RLE Partial Weight Bearing Percentage or Pounds: 25%      Mobility Bed Mobility   Bed Mobility: Supine to Sit;Sit to Supine     Supine to sit: Max assist Sit to supine: Max assist   General bed mobility comments: pt did A but seemed very tired.  Pt returned back to supine  Transfers                 General transfer comment: did not perform         ADL Overall ADL's : Needs assistance/impaired Eating/Feeding: Minimal assistance;Bed level   Grooming: Bed level;Minimal assistance                                 General ADL Comments: Pt very sleepy- but did wake briefly.  Pt able to perform simple grooming task bed level.               Pertinent Vitals/Pain Pain Assessment: Faces Pain Score: 4  Pain Location: r hip with movement Pain Descriptors / Indicators: Sore;Grimacing Pain Intervention(s): Limited activity within patient's tolerance;Repositioned         Extremity/Trunk Assessment Upper Extremity Assessment Upper Extremity Assessment: Generalized weakness           Communication     Cognition Arousal/Alertness: Lethargic Behavior During Therapy: WFL for tasks assessed/performed Overall Cognitive Status: No family/caregiver present to determine baseline cognitive functioning (pt is pleasantly confused)                     General Comments   no family present- pt very fatigued            Home Living Family/patient expects to be discharged to:: Skilled nursing facility                                             OT Diagnosis: Generalized weakness   OT Problem List: Decreased strength;Decreased activity tolerance;Impaired balance (sitting and/or standing);Decreased knowledge of precautions;Decreased knowledge of use of DME or AE   OT Treatment/Interventions: Self-care/ADL training;Patient/family education    OT Goals(Current goals can be found in the care plan section) Acute Rehab OT Goals Patient Stated Goal: did not state Time For Goal Achievement: 02/01/15  OT Frequency: Min 2X/week   Barriers to D/C:  End of Session Nurse Communication: Mobility status  Activity Tolerance: Patient limited by fatigue Patient left: in bed;with call bell/phone within reach     Charges:  OT General Charges $OT Visit: 1 Procedure OT Evaluation $Initial OT Evaluation Tier I: 1 Procedure G-Codes:    Betsy Pries 2015/02/17, 12:57 PM

## 2015-01-19 ENCOUNTER — Encounter (HOSPITAL_COMMUNITY): Payer: Self-pay | Admitting: Orthopedic Surgery

## 2015-01-19 DIAGNOSIS — I482 Chronic atrial fibrillation: Secondary | ICD-10-CM

## 2015-01-19 DIAGNOSIS — D638 Anemia in other chronic diseases classified elsewhere: Secondary | ICD-10-CM

## 2015-01-19 DIAGNOSIS — W19XXXS Unspecified fall, sequela: Secondary | ICD-10-CM

## 2015-01-19 DIAGNOSIS — S72001S Fracture of unspecified part of neck of right femur, sequela: Secondary | ICD-10-CM

## 2015-01-19 DIAGNOSIS — Z515 Encounter for palliative care: Secondary | ICD-10-CM

## 2015-01-19 MED ORDER — FERROUS SULFATE 325 (65 FE) MG PO TABS: 325.0000 mg | ORAL_TABLET | Freq: Every day | ORAL | Status: AC

## 2015-01-19 MED ORDER — BOOST / RESOURCE BREEZE PO LIQD: 1.0000 | Freq: Three times a day (TID) | ORAL | Status: DC

## 2015-01-19 NOTE — Progress Notes (Signed)
Clinical Social Work  Patient to DC to [facility]. Patient and dtr aware and prefer [facility] for transportation. RN to call report. DC packet prepared with DNR, DC summary, FL2 and hard scripts included. [employer] auth #: [id number].  [facility] request #: [id number].  CSW is signing off but available if needed.  [place], [place] [phone]

## 2015-01-19 NOTE — Plan of Care (Signed)
Problem: Phase I Progression Outcomes Goal: OOB as tolerated unless otherwise ordered Outcome: Completed/Met Date Met:  01/19/15 Has been up with PT

## 2015-01-19 NOTE — Progress Notes (Signed)
Subjective: 3 Days Post-Op Procedure(s) (LRB): INTRAMEDULLARY (IM) RETROGRADE FEMORAL NAILING (Right) Patient reports pain as mild.    Objective: Vital signs in last 24 hours: Temp:  [98.5 F (36.9 C)-99.4 F (37.4 C)] 99.4 F (37.4 C) (08/23 0500) Pulse Rate:  [56-84] 56 (08/23 0500) Resp:  [16-18] 18 (08/23 0500) BP: (119-150)/(50-67) 150/67 mmHg (08/23 0500) SpO2:  [89 %-100 %] 99 % (08/23 0500)  Intake/Output from previous day: 08/22 0701 - 08/23 0700 In: -  Out: 1100 [Urine:1100] Intake/Output this shift:     Recent Labs  01/16/15 0922 01/16/15 1207 01/17/15 0549 01/18/15 0830  HGB 10.7* 10.4* 7.8* 8.4*    Recent Labs  01/17/15 0549 01/18/15 0830  WBC 11.0* 12.0*  RBC 2.53* 2.66*  HCT 24.5* 25.6*  PLT 186 154    Recent Labs  01/17/15 0549 01/18/15 0830  NA 143 142  K 3.6 3.2*  CL 104 102  CO2 31 32  BUN 19 15  CREATININE 0.61 0.56  GLUCOSE 133* 121*  CALCIUM 8.1* 7.9*    Recent Labs  01/16/15 0922  INR 1.07   right hip/leg exam:  Neurovascular intact Sensation intact distally Intact pulses distally Dorsiflexion/Plantar flexion intact Incision: dressing C/D/I Compartment soft  Assessment/Plan: 3 Days Post-Op Procedure(s) (LRB): INTRAMEDULLARY (IM) RETROGRADE FEMORAL NAILING (Right) Acute blood loss anemia. Stable. Plan: 25% weightbearing on right lower extremity. Tramadol 50 mg one every 6-8 hours as needed for pain. Aspirin 325 mg enteric-coated 1 daily 1 month for DVT prophylaxis. Up with therapy Discharge to SNF when medically stable. Hopefully today. Follow-up with Dr. Berenice Primas in 2 weeks.  [place] G 01/19/2015, 8:47 AM

## 2015-01-19 NOTE — Progress Notes (Signed)
Report called to Eyvonne Mechanic, nurse at [facility].

## 2015-01-19 NOTE — Consult Note (Signed)
Consultation Note Date: 01/19/2015   Patient Name: Jean Murphy  DOB: December 05, 1917  MRN: [id number]  Age / Sex: 81 y.o., female   PCP: Antony Blackbird, MD Referring Physician: No att. providers found  Reason for Consultation: Establishing goals of care  Palliative Care Assessment and Plan Summary of Established Goals of Care and Medical Treatment Preferences   Clinical Assessment/Narrative: 81 year old female with past medical history of chronic diastolic CHF (2 D ECHO in 02/2014 with preserved EF), hypertension, atrial fibrillation not on AC due to risk of falls, depression, hospitalization in 05/2014 for pyelonephritis and aspiration pneumonia. She is from SNF. Patient presented to [facility] ED status post mechanical fall. She was found to have right intertrochanteric hip fracture and underwent ORIF 01/16/15. Palliative was consulted for goals of care.  I called and spoke with her daughter, Jean Murphy, about her mother's clinical course and care. She reports that her mother has always been someone who is okay with people helping to take care of and pamper her.  She reports that her mother has been a DO NOT RESUSCITATE for years and I found a living will from [place] in chart that stated such. I completed a durable DO NOT RESUSCITATE to send with her to nursing facility per daughter's request.  She reports that she has been pleased with care her mother has received here at the hospital, however she remains concerned about course moving forward. She knows that her mother has a high risk of declining after having a hip fracture.  We discussed with this may look like, and I notified her of the road signs of cognition, functional status, nutrition which may point to her mother's overall course. Based upon the goals we talked about I told her that I thought a reasonable plan of action would be to continue with current care and see how she does with rehabilitation. If she continues to improve, I noted that this  would be something to celebrate, however we also discussed if she continued to decline this may the point to consider enrolling her in hospice services to allow her to remain where she is at and have care brought to her.  ntacts/Participants in Discussion: Primary Decision Maker:  her daughter Jean Murphy: yes  A a copy is on the chart    Code Status/Advance Care Planning  DO NOT RESUSCITATE. Durable DO NOT RESUSCITATE completed    Symptom Management: recommend continue scheduled Tylenol   Prognosis: Unable to determine  Discharge Planning:  [place] for rehab with Palliative care service follow-up       Chief Complaint/History of Present Illness:   81 year old female status post fracture after fall  Primary Diagnoses  Present on Admission:  . Hip fracture, right . (Resolved) Hip fracture . Atrial fibrillation . Fall . Essential hypertension . Depression . Hypokalemia . Anemia of chronic disease . Leukocytosis  Palliative Review of Systems: She denies any complaints today. I have reviewed the medical record, interviewed the patient and family, and examined the patient. The following aspects are pertinent.  Past Medical History  Diagnosis Date  . CHF (congestive heart failure)   . Pleural effusion   . HTN (hypertension)   . Stroke   . Hyperlipidemia   . Diabetes mellitus without complication   . Compound fracture     left leg  . AAA (abdominal aortic aneurysm)   . Eczema   . Compression fracture     x 2   . Hypertension   .  Irregular heart beat    Social History   Social History  . Marital Status: Widowed    Spouse Name: N/A  . Number of Children: N/A  . Years of Education: N/A   Social History Main Topics  . Smoking status: Never Smoker   . Smokeless tobacco: None  . Alcohol Use: No  . Drug Use: No  . Sexual Activity: Not Asked   Other Topics Concern  . None   Social History Narrative   ** Merged History Encounter **         Family History  Problem Relation Age of Onset  . Heart attack Mother 53  . Heart disease Mother    Scheduled Meds: Continuous Infusions: PRN Meds:. Medications Prior to Admission:  Prior to Admission medications   Medication Sig Start Date End Date Taking? Authorizing Provider  acetaminophen (TYLENOL) 500 MG tablet Take 500 mg by mouth every 6 (six) hours as needed for mild pain or headache.    Yes Historical Provider, MD  acidophilus (RISAQUAD) CAPS capsule Take 1 capsule by mouth 2 (two) times daily. 06/09/14  Yes Nita Sells, MD  calcitonin, salmon, (MIACALCIN/FORTICAL) 200 UNIT/ACT nasal spray Place 1 spray into alternate nostrils daily.   Yes Historical Provider, MD  digoxin (LANOXIN) 0.125 MG tablet Take 0.125 mg by mouth daily.   Yes Historical Provider, MD  docusate sodium (COLACE) 100 MG capsule Take 100 mg by mouth 2 (two) times daily.   Yes Historical Provider, MD  famotidine (PEPCID) 20 MG tablet Take 20 mg by mouth at bedtime.   Yes Historical Provider, MD  fluticasone (FLONASE) 50 MCG/ACT nasal spray Place 1 spray into both nostrils daily.  04/07/14  Yes Historical Provider, MD  furosemide (LASIX) 20 MG tablet Take 20-40 mg by mouth 2 (two) times daily. Take 40 mg in the morning and 20mg  in the afternoon   Yes Historical Provider, MD  hydrALAZINE (APRESOLINE) 50 MG tablet Take 50 mg by mouth every 8 (eight) hours.   Yes Historical Provider, MD  loperamide (IMODIUM A-D) 2 MG tablet Take 2 mg by mouth 4 (four) times daily as needed for diarrhea or loose stools.   Yes Historical Provider, MD  Menthol-Zinc Oxide (CALMOSEPTINE) 0.44-20.6 % OINT Apply 1 application topically.   Yes Historical Provider, MD  metoprolol tartrate (LOPRESSOR) 25 MG tablet Take 25 mg by mouth 2 (two) times daily.   Yes Historical Provider, MD  Multiple Vitamins-Minerals (PRESERVISION AREDS PO) Take 1 tablet by mouth 2 (two) times daily.   Yes Historical Provider, MD  PARoxetine (PAXIL) 20 MG  tablet Take 20 mg by mouth daily.   Yes Historical Provider, MD  potassium chloride SA (K-DUR,KLOR-CON) 20 MEQ tablet Take 20 mEq by mouth daily.   Yes Historical Provider, MD  aspirin EC 325 MG tablet Take 1 tablet (325 mg total) by mouth daily after breakfast. Take x 1 month post op to decrease risk of blood clots. 01/16/15   Gary Fleet, PA-C  feeding supplement (BOOST / RESOURCE BREEZE) LIQD Take 1 Container by mouth 3 (three) times daily between meals. 01/19/15   Robbie Lis, MD  ferrous sulfate 325 (65 FE) MG tablet Take 1 tablet (325 mg total) by mouth daily with breakfast. 01/19/15   Robbie Lis, MD  traMADol (ULTRAM) 50 MG tablet Take 1 tablet (50 mg total) by mouth every 6 (six) hours as needed for moderate pain. 01/16/15   Gary Fleet, PA-C   Allergies  Allergen Reactions  .  Penicillins     unknown  . Shellfish Allergy     Hives     CBC:    Component Value Date/Time   WBC 12.0* 01/18/2015 0830   HGB 8.4* 01/18/2015 0830   HCT 25.6* 01/18/2015 0830   PLT 154 01/18/2015 0830   MCV 96.2 01/18/2015 0830   NEUTROABS 9.8* 01/18/2015 0830   LYMPHSABS 1.0 01/18/2015 0830   MONOABS 1.1* 01/18/2015 0830   EOSABS 0.1 01/18/2015 0830   BASOSABS 0.0 01/18/2015 0830   Comprehensive Metabolic Panel:    Component Value Date/Time   NA 142 01/18/2015 0830   K 3.2* 01/18/2015 0830   CL 102 01/18/2015 0830   CO2 32 01/18/2015 0830   BUN 15 01/18/2015 0830   CREATININE 0.56 01/18/2015 0830   GLUCOSE 121* 01/18/2015 0830   CALCIUM 7.9* 01/18/2015 0830   AST 21 01/16/2015 1207   ALT 11* 01/16/2015 1207   ALKPHOS 48 01/16/2015 1207   BILITOT 0.8 01/16/2015 1207   PROT 5.8* 01/16/2015 1207   ALBUMIN 3.2* 01/16/2015 1207    Physical Exam: Vital Signs: BP 125/64 mmHg  Pulse 68  Temp(Src) 97.3 F (36.3 C) (Oral)  Resp 16  Ht 5\' 4"  (1.626 m)  Wt 63.504 kg (140 lb)  BMI 24.02 kg/m2  SpO2 97% SpO2: SpO2: 97 % O2 Device: O2 Device: Nasal Cannula O2 Flow Rate: O2 Flow Rate  (L/min): 2 L/min Intake/output summary:  Intake/Output Summary (Last 24 hours) at 01/19/15 2011 Last data filed at 01/19/15 1300  Gross per 24 hour  Intake    360 ml  Output      0 ml  Net    360 ml   LBM:   Baseline Weight: Weight: 63.504 kg (140 lb) Most recent weight: Weight: 63.504 kg (140 lb)  Exam Findings:  General: Alert, awake, in no acute distress.  HEENT: No bruits, no goiter, no JVD Heart: Regular rate and rhythm. No murmur appreciated. Lungs: Good air movement, clear Abdomen: Soft, nontender, nondistended, positive bowel sounds.  Ext: No significant edema Surgical site right knee c/d/i Skin: Warm and dry Neuro: Grossly intact, nonfocal.         Palliative Performance Scale: 30              Additional Data Reviewed: Recent Labs     01/17/15  0549  01/18/15  0830  WBC  11.0*  12.0*  HGB  7.8*  8.4*  PLT  186  154  NA  143  142  BUN  19  15  CREATININE  0.61  0.56     Time In: 100 Time Out: 150  Time Total: 55 Greater than 50%  of this time was spent counseling and coordinating care related to the above assessment and plan.  Signed by: Micheline Rough, MD  Micheline Rough, MD  01/19/2015, 8:11 PM  Please contact Palliative Medicine Team phone at [phone] for questions and concerns.

## 2015-01-19 NOTE — Discharge Summary (Addendum)
Physician Discharge Summary  Jean Murphy JJO:[id number] DOB: 12-16-1917 DOA: 01/16/2015  PCP: Antony Blackbird, MD  Admit date: 01/16/2015 Discharge date: 01/19/2015  Recommendations for Outpatient Follow-up:  1. Patient will continue aspirin as prescribed for DVT prophylaxis.  Discharge Diagnoses:  Principal Problem:   Fall Active Problems:   Hip fracture, right   Atrial fibrillation   Essential hypertension   Depression   Hypokalemia   Anemia of chronic disease   Leukocytosis   Postoperative anemia due to acute blood loss    Discharge Condition: stable   Diet recommendation: as tolerated   History of present illness:  81 year old female with past medical history of chronic diastolic CHF (2 D ECHO in 02/2014 with preserved EF), hypertension, atrial fibrillation not on AC due to risk of falls, depression, hospitalization in 05/2014 for pyelonephritis and aspiration pneumonia. She is from SNF. Patient presented to [facility] ED status post mechanical fall. She was found to have right intertrochanteric hip fracture and underwent ORIF 01/16/15.   Hospital Course:    Assessment/Plan:    Principal Problem:  Fall / Hip fracture, right - Pt is status post mechanical fall and subsequent right intertrochanteric hip fracture - Status post ORIF 01/16/15 - Continue pain management efforts with tramadol prescribed per or throat. - Continue aspirin on discharge for DVT prophylaxis - Per physical therapy evaluation recommendation is for skilled nursing facility placement. Patient aware and agreeable to discharge to skilled nursing facility, [facility].   Active Problems:  Atrial fibrillation - CHADS vasc score at least 3 - Not on anticoagulation due to fall risk - Continue metoprolol, digoxin, aspirin on discharge   Essential hypertension - Continue metoprolol, hydralazine  - Blood pressure controlled  Chronic diastolic heart failure - 2-D echo on 02/26/2014 with a EF of  65-70% with no wall motion abnormalities. Moderate pulmonary hypertension. - Compensated - Continue digoxin and metoprolol - Continue Lasix as per prior home regimen - Continue potassium supplementation as prescribed, 20 meq daily   Depression - Continue Paxil - Stable - Patient does not appear to be depressed   Hypokalemia - Secondary to Lasix. Supplemented.   Acute postoperative blood loss anemia - Likely postoperative after ORIF - Hemoglobin as low as 7.8 after surgery. - Patient has received 1 unit of PRBC transfusion on 01/17/2015 Posterior fusion hemoglobin is 8.4-    Leukocytosis - No evidence of acute infectious process - CXR with no evidence of pneumonia - No urinary complaints    Severe protein calorie malnutrition - In the context of acute and chronic illness - Nutrition consulted     DVT Prophylaxis  - Aspirin per ortho    Code Status: Full.  Family Communication: plan of care discussed with the patient; family not at the bedside this am  IV access:  Peripheral IV  Procedures and diagnostic studies:   Dg Chest 1 View 01/16/2015 Right greater than left pleural effusions and basilar atelectasis. Cardiomegaly and mild interstitial edema. Electronically Signed By: Inge Rise M.D. On: 01/16/2015 10:29   Dg Shoulder Right 01/16/2015 No acute bony pathology. Electronically Signed By: Marybelle Killings M.D. On: 01/16/2015 10:29   Dg Knee 1-2 Views Right 01/16/2015 No acute abnormality. Advanced osteoarthritis appears worst medially. Electronically Signed By: Inge Rise M.D. On: 01/16/2015 10:27   Dg Hip Unilat With Pelvis 2-3 Views Right 01/16/2015 Comminuted right intertrochanteric femur fracture. Electronically Signed By: Marybelle Killings M.D. On: 01/16/2015 10:29   Dg Femur Port, Min 2 Views Right 01/16/2015 ORIF of the comminuted  intertrochanteric right femoral neck fracture. Electronically Signed By: Evangeline Dakin M.D. On: 01/16/2015 17:23    Medical Consultants:  Orthopedic surgery Palliative care   Other Consultants:  PT Nutrition  IAnti-Infectives:   None    Signed:  Leisa Lenz, MD  [facility] 01/19/2015, 10:40 AM  Pager #: [phone]  Time spent in minutes: more than 30 minutes   Discharge Exam: Filed Vitals:   01/19/15 1035  BP:   Pulse: 77  Temp:   Resp:    Filed Vitals:   01/18/15 2158 01/19/15 0200 01/19/15 0500 01/19/15 1035  BP: 119/50 119/59 150/67   Pulse: 84 83 56 77  Temp: 98.5 F (36.9 C) 98.8 F (37.1 C) 99.4 F (37.4 C)   TempSrc: Oral Oral Oral   Resp: 16 18 18    Height:      Weight:      SpO2: 93% 97% 99%     General: Pt is alert, follows commands appropriately, not in acute distress Cardiovascular: Regular rate and rhythm, S1/S2 appreciated  Respiratory: Clear to auscultation bilaterally, no wheezing, no crackles, no rhonchi Abdominal: Soft, non tender, non distended, bowel sounds +, no guarding Extremities: no cyanosis, pulses palpable bilaterally DP and PT Neuro: Grossly nonfocal  Discharge Instructions  Discharge Instructions    Call MD for:  difficulty breathing, headache or visual disturbances    Complete by:  As directed      Call MD for:  persistant nausea and vomiting    Complete by:  As directed      Call MD for:  severe uncontrolled pain    Complete by:  As directed      Diet - low sodium heart healthy    Complete by:  As directed      Increase activity slowly    Complete by:  As directed      Partial weight bearing    Complete by:  As directed   % Body Weight:  25%  Laterality:  right  Extremity:  Lower            Medication List    STOP taking these medications        aspirin 81 MG chewable tablet  Replaced by:  aspirin EC 325 MG tablet      TAKE these medications        acetaminophen 500 MG tablet  Commonly known as:  TYLENOL  Take 500 mg by mouth every 6 (six) hours as  needed for mild pain or headache.     acidophilus Caps capsule  Take 1 capsule by mouth 2 (two) times daily.     aspirin EC 325 MG tablet  Take 1 tablet (325 mg total) by mouth daily after breakfast. Take x 1 month post op to decrease risk of blood clots.     calcitonin (salmon) 200 UNIT/ACT nasal spray  Commonly known as:  MIACALCIN/FORTICAL  Place 1 spray into alternate nostrils daily.     CALMOSEPTINE 0.44-20.6 % Oint  Generic drug:  Menthol-Zinc Oxide  Apply 1 application topically.     digoxin 0.125 MG tablet  Commonly known as:  LANOXIN  Take 0.125 mg by mouth daily.     docusate sodium 100 MG capsule  Commonly known as:  COLACE  Take 100 mg by mouth 2 (two) times daily.     famotidine 20 MG tablet  Commonly known as:  PEPCID  Take 20 mg by mouth at bedtime.     feeding supplement Liqd  Take 1 Container by mouth 3 (three) times daily between meals.     ferrous sulfate 325 (65 FE) MG tablet  Take 1 tablet (325 mg total) by mouth daily with breakfast.     fluticasone 50 MCG/ACT nasal spray  Commonly known as:  FLONASE  Place 1 spray into both nostrils daily.     furosemide 20 MG tablet  Commonly known as:  LASIX  Take 20-40 mg by mouth 2 (two) times daily. Take 40 mg in the morning and 20mg  in the afternoon     hydrALAZINE 50 MG tablet  Commonly known as:  APRESOLINE  Take 50 mg by mouth every 8 (eight) hours.     loperamide 2 MG tablet  Commonly known as:  IMODIUM A-D  Take 2 mg by mouth 4 (four) times daily as needed for diarrhea or loose stools.     metoprolol tartrate 25 MG tablet  Commonly known as:  LOPRESSOR  Take 25 mg by mouth 2 (two) times daily.     PARoxetine 20 MG tablet  Commonly known as:  PAXIL  Take 20 mg by mouth daily.     potassium chloride SA 20 MEQ tablet  Commonly known as:  K-DUR,KLOR-CON  Take 20 mEq by mouth daily.     PRESERVISION AREDS PO  Take 1 tablet by mouth 2 (two) times daily.     traMADol 50 MG tablet  Commonly  known as:  ULTRAM  Take 1 tablet (50 mg total) by mouth every 6 (six) hours as needed for moderate pain.           Follow-up Information    Follow up with GRAVES,JOHN L, MD. Schedule an appointment as soon as possible for a visit in 2 weeks.   Specialty:  Orthopedic Surgery   Contact information:   [place] [address] [phone]       Follow up with FULP, CAMMIE, MD. Schedule an appointment as soon as possible for a visit in 1 week.   Specialty:  Family Medicine   Why:  Follow up appt after recent hospitalization   Contact information:   [address] [place] [address] [phone]        The results of significant diagnostics from this hospitalization (including imaging, microbiology, ancillary and laboratory) are listed below for reference.    Significant Diagnostic Studies: Dg Chest 1 View  01/16/2015   CLINICAL DATA:  Status post fall onto the right side today. Initial encounter.  EXAM: CHEST  1 VIEW  COMPARISON:  PA and lateral chest 08/27/2014 and 06/05/2014.  FINDINGS: There is marked cardiomegaly and mild interstitial edema. Hazy opacity over the right chest is most consistent with a layering pleural effusion. Smaller left pleural effusion is noted. The patient is rotated on the study. Extensive aortic atherosclerosis is seen. Chronic compression fracture in the lower thoracic spine is noted.  IMPRESSION: Right greater than left pleural effusions and basilar atelectasis.  Cardiomegaly and mild interstitial edema.   Electronically Signed   By: Inge Rise M.D.   On: 01/16/2015 10:29   Dg Shoulder Right  01/16/2015   CLINICAL DATA:  Fall  EXAM: RIGHT SHOULDER - 2+ VIEW  COMPARISON:  None.  FINDINGS: Osteopenia.  No acute fracture.  No dislocation.  IMPRESSION: No acute bony pathology.   Electronically Signed   By: Marybelle Killings M.D.   On: 01/16/2015 10:29   Dg Knee 1-2 Views Right  01/16/2015   CLINICAL DATA:  Status  post fall today. Right knee  pain. Initial encounter.  EXAM: RIGHT KNEE - 1-2 VIEW  COMPARISON:  None.  FINDINGS: No acute bony or joint abnormality is identified. Advanced osteoarthritis about the knee appears worst in the medial compartment. Small joint effusion is noted. Atherosclerotic calcifications are seen.  IMPRESSION: No acute abnormality.  Advanced osteoarthritis appears worst medially.   Electronically Signed   By: Inge Rise M.D.   On: 01/16/2015 10:27   Dg C-arm 1-60 Min-no Report  01/16/2015   CLINICAL DATA: FRACTURED RIGHT HIP   C-ARM 1-60 MINUTES  Fluoroscopy was utilized by the requesting physician.  No radiographic  interpretation.    Dg Hip Unilat With Pelvis 2-3 Views Right  01/16/2015   CLINICAL DATA:  Fall  EXAM: DG HIP (WITH OR WITHOUT PELVIS) 2-3V RIGHT  COMPARISON:  None.  FINDINGS: Comminuted right intertrochanteric femur fracture with varus deformity is noted. Vascular calcifications. Osteopenia. Degenerative changes in the lumbar spine.  IMPRESSION: Comminuted right intertrochanteric femur fracture.   Electronically Signed   By: Marybelle Killings M.D.   On: 01/16/2015 10:29   Dg Femur Port, Min 2 Views Right  01/16/2015   CLINICAL DATA:  ORIF of a comminuted intertrochanteric right femoral neck fracture.  EXAM: OPERATIVE RIGHT HIP (WITH PELVIS IF PERFORMED) 2 VIEWS 1706 through 1717 hr:  TECHNIQUE: Fluoroscopic spot image(s) were submitted for interpretation post-operatively.  COMPARISON:  Preoperative right hip x-rays earlier same date 1003 hr.  FINDINGS: Four spot images from the C-arm fluoroscopic device, AP and lateral views of the right hip to include the distal femur were obtained. Patient has undergone ORIF of the comminuted intertrochanteric right femoral neck fracture with an intramedullary nail and compression screw. Alignment is near anatomic. The lesser trochanter is again noted is a free fragment.  The radiologic technologist documented 1 min 14 sec fluoroscopy time.  IMPRESSION: ORIF of the  comminuted intertrochanteric right femoral neck fracture.   Electronically Signed   By: Evangeline Dakin M.D.   On: 01/16/2015 17:23    Microbiology: No results found for this or any previous visit (from the past 240 hour(s)).   Labs: Basic Metabolic Panel:  Recent Labs Lab 01/16/15 0922 01/16/15 1207 01/17/15 0549 01/18/15 0830  NA 141 143 143 142  K 3.3* 3.4* 3.6 3.2*  CL 99* 99* 104 102  CO2 36* 35* 31 32  GLUCOSE 111* 114* 133* 121*  BUN 19 18 19 15   CREATININE 0.68 0.70 0.61 0.56  CALCIUM 8.7* 8.6* 8.1* 7.9*   Liver Function Tests:  Recent Labs Lab 01/16/15 1207  AST 21  ALT 11*  ALKPHOS 48  BILITOT 0.8  PROT 5.8*  ALBUMIN 3.2*   No results for input(s): LIPASE, AMYLASE in the last 168 hours. No results for input(s): AMMONIA in the last 168 hours. CBC:  Recent Labs Lab 01/16/15 0922 01/16/15 1207 01/17/15 0549 01/18/15 0830  WBC 9.9 12.5* 11.0* 12.0*  NEUTROABS 7.9* 10.7*  --  9.8*  HGB 10.7* 10.4* 7.8* 8.4*  HCT 34.1* 33.3* 24.5* 25.6*  MCV 96.6 97.7 96.8 96.2  PLT 200 214 186 154   Cardiac Enzymes: No results for input(s): CKTOTAL, CKMB, CKMBINDEX, TROPONINI in the last 168 hours. BNP: BNP (last 3 results)  Recent Labs  05/30/14 2210 06/03/14 0630  BNP 691.9* 681.1*    ProBNP (last 3 results) No results for input(s): PROBNP in the last 8760 hours.  CBG: No results for input(s): GLUCAP in the last 168 hours.

## 2015-01-19 NOTE — Clinical Social Work Placement (Signed)
   CLINICAL SOCIAL WORK PLACEMENT  NOTE  Date:  01/19/2015  Patient Details  Name: Jean Murphy MRN: [id number] Date of Birth: [birth year]  Clinical Social Work is seeking post-discharge placement for this patient at the [place] level of care (*CSW will initial, date and re-position this form in  chart as items are completed):  Yes   Patient/family provided with [facility] Work Department's list of facilities offering this level of care within the geographic area requested by the patient (or if unable, by the patient's family).  Yes   Patient/family informed of their freedom to choose among providers that offer the needed level of care, that participate in Medicare, Medicaid or managed care program needed by the patient, have an available bed and are willing to accept the patient.  Yes   Patient/family informed of [facility]'s ownership interest in [facility] and [facility], as well as of the fact that they are under no obligation to receive care at these facilities.  PASRR submitted to EDS on       PASRR number received on       Existing PASRR number confirmed on 01/18/15     FL2 transmitted to all facilities in geographic area requested by pt/family on 01/18/15     FL2 transmitted to all facilities within larger geographic area on       Patient informed that his/her managed care company has contracts with or will negotiate with certain facilities, including the following:        Yes   Patient/family informed of bed offers received.  Patient chooses bed at [facility]     Physician recommends and patient chooses bed at      Patient to be transferred to [facility] on 01/19/15.  Patient to be transferred to facility by [facility]     Patient family notified on 01/19/15 of transfer.  Name of family member notified:  Dtr via phone     PHYSICIAN       Additional Comment:     _______________________________________________ Boone Master, [place] 01/19/2015, 1:59 PM

## 2015-01-19 NOTE — Clinical Documentation Improvement (Signed)
Hospitalist  Please consider documenting as the RD has assessed the patient:   Document Severity - Severe(third degree), Moderate (second degree), Mild (first degree)  Other condition  Unable to clinically determine  Document any associated diagnoses/conditions  Supporting Information:  Initial Nutrition Assessment by Clayton Bibles, RD at 2015/02/08 11:14 AM DOCUMENTATION CODES:  Severe malnutrition in context of chronic illness   INTERVENTION:  Provide Boost Breeze po TID, each supplement provides 250 kcal and 9 grams of protein  Diet advancement per MD  Encourage PO intake  RD to continue to monitor   NUTRITION DIAGNOSIS:  Malnutrition related to chronic illness as evidenced by severe depletion of body fat, severe depletion of muscle mass.   Please exercise your independent, professional judgment when responding. A specific answer is not anticipated or expected.  Thank You, Alessandra Grout, RN, BSN, CCDS,Clinical Documentation Specialist:  [phone]  [phone]=[facility] [place]- Health Information Management

## 2015-01-19 NOTE — Discharge Instructions (Signed)
Hip Fracture °A hip fracture is a fracture of the upper part of your thigh bone (femur).  °CAUSES °A hip fracture is caused by a direct blow to the side of your hip. This is usually the result of a fall but can occur in other circumstances, such as an automobile accident. °RISK FACTORS °There is an increased risk of hip fractures in people with: °· An unsteady walking pattern (gait) and those with conditions that contribute to poor balance, such as Parkinson's disease or dementia. °· Osteopenia and osteoporosis. °· Cancer that spreads to the leg bones. °· Certain metabolic diseases. °SYMPTOMS  °Symptoms of hip fracture include: °· Pain over the injured hip. °· Inability to put weight on the leg in which the fracture occurred (although, some patients are able to walk after a hip fracture). °· Toes and foot of the affected leg point outward when you lie down. °DIAGNOSIS °A physical exam can determine if a hip fracture is likely to have occurred. X-ray exams are needed to confirm the fracture and to look for other injuries. The X-ray exam can help to determine the type of hip fracture. Rarely, the fracture is not visible on an X-ray image and a CT scan or MRI will have to be done. °TREATMENT  °The treatment for a fracture is usually surgery. This means using a screw, nail, or rod to hold the bones in place.  °HOME CARE INSTRUCTIONS °Take all medicines as directed by your health care provider. °SEEK MEDICAL CARE IF: °Pain continues, even after taking pain medicine. °MAKE SURE YOU: °· Understand these instructions.   °· Will watch your condition. °· Will get help right away if you are not doing well or get worse. °Document Released: 05/15/2005 Document Revised: 05/20/2013 Document Reviewed: 12/25/2012 °[employer]® Patient Information ©2015 [employer]. This information is not intended to replace advice given to you by your health care provider. Make sure you discuss any questions you have with your health care  provider. ° °

## 2015-01-19 NOTE — Care Management Important Message (Signed)
Important Message  Patient Details  Name: Jean Murphy MRN: [id number] Date of Birth: 09-Mar-1918   Medicare Important Message Given:  [facility] notification given    Camillo Flaming 01/19/2015, 12:24 [place] Message  Patient Details  Name: Jean Murphy MRN: [id number] Date of Birth: [birth year]   Medicare Important Message Given:  Yes-second notification given    Camillo Flaming 01/19/2015, 12:24 PM

## 2015-01-20 ENCOUNTER — Encounter: Payer: Self-pay | Admitting: Adult Health

## 2015-01-20 ENCOUNTER — Non-Acute Institutional Stay (SKILLED_NURSING_FACILITY): Payer: Commercial Managed Care - HMO | Admitting: Adult Health

## 2015-01-20 DIAGNOSIS — D72829 Elevated white blood cell count, unspecified: Secondary | ICD-10-CM | POA: Diagnosis not present

## 2015-01-20 DIAGNOSIS — J309 Allergic rhinitis, unspecified: Secondary | ICD-10-CM

## 2015-01-20 DIAGNOSIS — D62 Acute posthemorrhagic anemia: Secondary | ICD-10-CM | POA: Diagnosis not present

## 2015-01-20 DIAGNOSIS — I5032 Chronic diastolic (congestive) heart failure: Secondary | ICD-10-CM

## 2015-01-20 DIAGNOSIS — F329 Major depressive disorder, single episode, unspecified: Secondary | ICD-10-CM | POA: Diagnosis not present

## 2015-01-20 DIAGNOSIS — K5900 Constipation, unspecified: Secondary | ICD-10-CM | POA: Diagnosis not present

## 2015-01-20 DIAGNOSIS — E876 Hypokalemia: Secondary | ICD-10-CM | POA: Diagnosis not present

## 2015-01-20 DIAGNOSIS — I10 Essential (primary) hypertension: Secondary | ICD-10-CM

## 2015-01-20 DIAGNOSIS — S72001S Fracture of unspecified part of neck of right femur, sequela: Secondary | ICD-10-CM | POA: Diagnosis not present

## 2015-01-20 DIAGNOSIS — F32A Depression, unspecified: Secondary | ICD-10-CM

## 2015-01-20 DIAGNOSIS — I482 Chronic atrial fibrillation, unspecified: Secondary | ICD-10-CM

## 2015-01-20 NOTE — Progress Notes (Signed)
Patient ID: Jean Murphy, female   DOB: 06/08/1917, 81 y.o.   MRN: [id number]    DATE:  01/20/2015 MRN:  [id number]  BIRTHDAY: 02-May-1918  Facility:  Nursing Home Location:  [place] Room Number: 938-1  LEVEL OF CARE:  SNF [phone])  Contact Information    Name Relation Home Work [place] Daughter [phone]  [place] Daughter [phone]       Chief Complaint  Patient presents with  . Hospitalization Follow-up   HISTORY OF PRESENT ILLNESS:  This is a 81 year old female who has been admitted to [facility] on 01/19/15 from [facility]. She has PMH of chronic diastolic CHF, hypertension, atrial fibrillation not on anticoagulation due to risk for falls and depression. She had a fall and sustained a right intertrochanteric hip fracture. She had ORIF on 01/16/15.  She has been admitted for a short-term rehabilitation.  PAST MEDICAL HISTORY:  Past Medical History  Diagnosis Date  . CHF (congestive heart failure)   . Pleural effusion   . HTN (hypertension)   . Stroke   . Hyperlipidemia   . Diabetes mellitus without complication   . Compound fracture     left leg  . AAA (abdominal aortic aneurysm)   . Eczema   . Compression fracture     x 2   . Hypertension   . Irregular heart beat      CURRENT MEDICATIONS: Reviewed  Patient's Medications  New Prescriptions   No medications on file  Previous Medications   ACETAMINOPHEN (TYLENOL) 500 MG TABLET    Take 500 mg by mouth every 6 (six) hours as needed for mild pain or headache.    ACIDOPHILUS (RISAQUAD) CAPS CAPSULE    Take 1 capsule by mouth 2 (two) times daily.   ASPIRIN EC 325 MG TABLET    Take 1 tablet (325 mg total) by mouth daily after breakfast. Take x 1 month post op to decrease risk of blood clots.   CALCITONIN, SALMON, (MIACALCIN/FORTICAL) 200 UNIT/ACT NASAL SPRAY    Place 1 spray into alternate nostrils  daily.   DIGOXIN (LANOXIN) 0.125 MG TABLET    Take 0.125 mg by mouth daily.   DOCUSATE SODIUM (COLACE) 100 MG CAPSULE    Take 100 mg by mouth 2 (two) times daily.   FAMOTIDINE (PEPCID) 20 MG TABLET    Take 20 mg by mouth at bedtime.   FEEDING SUPPLEMENT (BOOST / RESOURCE BREEZE) LIQD    Take 1 Container by mouth 3 (three) times daily between meals.   FERROUS SULFATE 325 (65 FE) MG TABLET    Take 1 tablet (325 mg total) by mouth daily with breakfast.   FLUTICASONE (FLONASE) 50 MCG/ACT NASAL SPRAY    Place 1 spray into both nostrils daily.    FUROSEMIDE (LASIX) 20 MG TABLET    Take 20-40 mg by mouth 2 (two) times daily. Take 40 mg in the morning and 20mg  in the afternoon   HYDRALAZINE (APRESOLINE) 50 MG TABLET    Take 50 mg by mouth every 8 (eight) hours.   LOPERAMIDE (IMODIUM A-D) 2 MG TABLET    Take 2 mg by mouth 4 (four) times daily as needed for diarrhea or loose stools.   MENTHOL-ZINC OXIDE (CALMOSEPTINE) 0.44-20.6 % OINT    Apply 1 application topically.   METOPROLOL TARTRATE (LOPRESSOR) 25 MG TABLET    Take 25 mg by mouth 2 (two) times daily.  MULTIPLE VITAMINS-MINERALS (PRESERVISION AREDS PO)    Take 1 tablet by mouth 2 (two) times daily.   PAROXETINE (PAXIL) 20 MG TABLET    Take 20 mg by mouth daily.   POTASSIUM CHLORIDE SA (K-DUR,KLOR-CON) 20 MEQ TABLET    Take 20 mEq by mouth daily.   TRAMADOL (ULTRAM) 50 MG TABLET    Take 1 tablet (50 mg total) by mouth every 6 (six) hours as needed for moderate pain.  Modified Medications   No medications on file  Discontinued Medications   No medications on file     Allergies  Allergen Reactions  . Penicillins     unknown  . Shellfish Allergy     Hives       REVIEW OF SYSTEMS:  GENERAL: no change in appetite, no fatigue, no weight changes, no fever, chills or weakness EYES: Denies change in vision, dry eyes, eye pain, itching or discharge EARS: Denies change in hearing, ringing in ears, or earache NOSE: Denies nasal congestion or  epistaxis MOUTH and THROAT: Denies oral discomfort, gingival pain or bleeding, pain from teeth or hoarseness   RESPIRATORY: no cough, SOB, DOE, wheezing, hemoptysis CARDIAC: no chest pain, edema or palpitations GI: no abdominal pain, diarrhea, constipation, heart burn, nausea or vomiting GU: Denies dysuria, frequency, hematuria, incontinence, or discharge PSYCHIATRIC: Denies feeling of depression or anxiety. No report of hallucinations, insomnia, paranoia, or agitation   PHYSICAL EXAMINATION  GENERAL APPEARANCE: Well nourished. In no acute distress. Normal body habitus SKIN:  Right upper and lower hip surgical site is covered with aquacel dressing, dry and no erythema HEAD: Normal in size and contour. No evidence of trauma EYES: Lids open and close normally. No blepharitis, entropion or ectropion. PERRL. Conjunctivae are clear and sclerae are white. Lenses are without opacity EARS: Pinnae are normal. Patient hears normal voice tunes of the examiner MOUTH and THROAT: Lips are without lesions. Oral mucosa is moist and without lesions. Tongue is normal in shape, size, and color and without lesions NECK: supple, trachea midline, no neck masses, no thyroid tenderness, no thyromegaly LYMPHATICS: no LAN in the neck, no supraclavicular LAN RESPIRATORY: breathing is even & unlabored, BS CTAB CARDIAC: RRR, no murmur,no extra heart sounds, no edema GI: abdomen soft, normal BS, no masses, no tenderness, no hepatomegaly, no splenomegaly EXTREMITIES:  Able to move x 4 extremities PSYCHIATRIC: Alert and oriented X 3. Affect and behavior are appropriate  LABS/RADIOLOGY: Labs reviewed: Basic Metabolic Panel:  Recent Labs  06/02/14 0414 06/03/14 0419  06/05/14 0445  01/16/15 1207 01/17/15 0549 01/18/15 0830  NA 140 133*  < > 132*  < > 143 143 142  K 3.4* 4.0  < > 3.2*  < > 3.4* 3.6 3.2*  CL 95* 93*  < > 92*  < > 99* 104 102  CO2 40* 30  < > 32  < > 35* 31 32  GLUCOSE 86 85  < > 104*  < > 114*  133* 121*  BUN 22 22  < > 29*  < > 18 19 15   CREATININE 0.72 0.76  < > 0.90  < > 0.70 0.61 0.56  CALCIUM 8.3* 8.4  < > 7.7*  < > 8.6* 8.1* 7.9*  MG 1.7 1.9  --  1.6  --   --   --   --   < > = values in this interval not displayed. Liver Function Tests:  Recent Labs  05/31/14 0050 06/05/14 0445 01/16/15 1207  AST 29 12 21  ALT 13 9 11*  ALKPHOS 57 55 48  BILITOT 1.2 0.8 0.8  PROT 5.9* 5.2* 5.8*  ALBUMIN 2.9* 2.3* 3.2*   CBC:  Recent Labs  01/16/15 0922 01/16/15 1207 01/17/15 0549 01/18/15 0830  WBC 9.9 12.5* 11.0* 12.0*  NEUTROABS 7.9* 10.7*  --  9.8*  HGB 10.7* 10.4* 7.8* 8.4*  HCT 34.1* 33.3* 24.5* 25.6*  MCV 96.6 97.7 96.8 96.2  PLT 200 214 186 154   Cardiac Enzymes:  Recent Labs  05/30/14 2040  05/31/14 0050 05/31/14 0547 05/31/14 1230  CKTOTAL 147  --   --   --   --   TROPONINI  --   < > 0.26* 0.21* 0.22*  < > = values in this interval not displayed.  CBG:  Recent Labs  06/07/14 0754 06/08/14 0752 06/09/14 0749  GLUCAP 80 78 100*      Dg Chest 1 View  01/16/2015   CLINICAL DATA:  Status post fall onto the right side today. Initial encounter.  EXAM: CHEST  1 VIEW  COMPARISON:  PA and lateral chest 08/27/2014 and 06/05/2014.  FINDINGS: There is marked cardiomegaly and mild interstitial edema. Hazy opacity over the right chest is most consistent with a layering pleural effusion. Smaller left pleural effusion is noted. The patient is rotated on the study. Extensive aortic atherosclerosis is seen. Chronic compression fracture in the lower thoracic spine is noted.  IMPRESSION: Right greater than left pleural effusions and basilar atelectasis.  Cardiomegaly and mild interstitial edema.   Electronically Signed   By: Inge Rise M.D.   On: 01/16/2015 10:29   Dg Shoulder Right  01/16/2015   CLINICAL DATA:  Fall  EXAM: RIGHT SHOULDER - 2+ VIEW  COMPARISON:  None.  FINDINGS: Osteopenia.  No acute fracture.  No dislocation.  IMPRESSION: No acute bony  pathology.   Electronically Signed   By: Marybelle Killings M.D.   On: 01/16/2015 10:29   Dg Knee 1-2 Views Right  01/16/2015   CLINICAL DATA:  Status post fall today. Right knee pain. Initial encounter.  EXAM: RIGHT KNEE - 1-2 VIEW  COMPARISON:  None.  FINDINGS: No acute bony or joint abnormality is identified. Advanced osteoarthritis about the knee appears worst in the medial compartment. Small joint effusion is noted. Atherosclerotic calcifications are seen.  IMPRESSION: No acute abnormality.  Advanced osteoarthritis appears worst medially.   Electronically Signed   By: Inge Rise M.D.   On: 01/16/2015 10:27   Dg C-arm 1-60 Min-no Report  01/16/2015   CLINICAL DATA: FRACTURED RIGHT HIP   C-ARM 1-60 MINUTES  Fluoroscopy was utilized by the requesting physician.  No radiographic  interpretation.    Dg Hip Unilat With Pelvis 2-3 Views Right  01/16/2015   CLINICAL DATA:  Fall  EXAM: DG HIP (WITH OR WITHOUT PELVIS) 2-3V RIGHT  COMPARISON:  None.  FINDINGS: Comminuted right intertrochanteric femur fracture with varus deformity is noted. Vascular calcifications. Osteopenia. Degenerative changes in the lumbar spine.  IMPRESSION: Comminuted right intertrochanteric femur fracture.   Electronically Signed   By: Marybelle Killings M.D.   On: 01/16/2015 10:29   Dg Femur Port, Min 2 Views Right  01/16/2015   CLINICAL DATA:  ORIF of a comminuted intertrochanteric right femoral neck fracture.  EXAM: OPERATIVE RIGHT HIP (WITH PELVIS IF PERFORMED) 2 VIEWS 1706 through 1717 hr:  TECHNIQUE: Fluoroscopic spot image(s) were submitted for interpretation post-operatively.  COMPARISON:  Preoperative right hip x-rays earlier same date 1003 hr.  FINDINGS: Four spot images  from the C-arm fluoroscopic device, AP and lateral views of the right hip to include the distal femur were obtained. Patient has undergone ORIF of the comminuted intertrochanteric right femoral neck fracture with an intramedullary nail and compression screw.  Alignment is near anatomic. The lesser trochanter is again noted is a free fragment.  The radiologic technologist documented 1 min 14 sec fluoroscopy time.  IMPRESSION: ORIF of the comminuted intertrochanteric right femoral neck fracture.   Electronically Signed   By: Evangeline Dakin M.D.   On: 01/16/2015 17:23    ASSESSMENT/PLAN:   Right intertrochanteric hip fracture S/P Orif - for rehabilitation; continue Tramadol 50 mg 1 tab PO Q 6 hours for pain; ASA 325 mg 1 tab PO Q D for DVT prophylaxis; RLE partial weight bearing; follow-up with Dr. Berenice Primas, orthopedic surgeon, in 2 weeks  Atrial fibrillation - not on anticoagulation due to fall risk; continue Metoprolol, Digoxin and ASA  Hypertension - continue Metoprolol and Hydralazine  Chronic diastolic CHF - continue Digoxin, Metoprolol, Lasix and Kcl; check BMP  Depression - mood is stable; continue Paxil 20 mg 1 tab PO daily  Hypokalemia - K 3.2; continue KCL supplement  Anemia, acute blood loss - S/P transfusion 1 unit PRBC  Leukocytosis - wbc 12.0; check CBC  Constipation -  Continue Colace 100 mg 1 capsule PO BID  Allergic rhinitis:  Continue Flonase 50 mcg/ACT 1 spray into both nostril daily    Goals of care:  Short-term rehabilitation     [facility], NP [facility] [phone]

## 2015-01-21 ENCOUNTER — Non-Acute Institutional Stay: Payer: Commercial Managed Care - HMO | Admitting: Internal Medicine

## 2015-01-21 DIAGNOSIS — I482 Chronic atrial fibrillation, unspecified: Secondary | ICD-10-CM

## 2015-01-21 DIAGNOSIS — I5032 Chronic diastolic (congestive) heart failure: Secondary | ICD-10-CM | POA: Diagnosis not present

## 2015-01-21 DIAGNOSIS — K5900 Constipation, unspecified: Secondary | ICD-10-CM

## 2015-01-21 DIAGNOSIS — D72829 Elevated white blood cell count, unspecified: Secondary | ICD-10-CM | POA: Diagnosis not present

## 2015-01-21 DIAGNOSIS — S72141S Displaced intertrochanteric fracture of right femur, sequela: Secondary | ICD-10-CM | POA: Diagnosis not present

## 2015-01-21 DIAGNOSIS — D62 Acute posthemorrhagic anemia: Secondary | ICD-10-CM | POA: Diagnosis not present

## 2015-01-21 DIAGNOSIS — R2681 Unsteadiness on feet: Secondary | ICD-10-CM

## 2015-01-21 DIAGNOSIS — I10 Essential (primary) hypertension: Secondary | ICD-10-CM | POA: Diagnosis not present

## 2015-01-21 NOTE — Progress Notes (Signed)
Patient ID: Jean Murphy, female   DOB: 09-09-1917, 81 y.o.   MRN: [id number]     [facility]   PCP: FULP, CAMMIE, MD  Code Status: DNR  Allergies  Allergen Reactions  . Penicillins     unknown  . Shellfish Allergy     Hives      Chief Complaint  Patient presents with  . New Admit To SNF     HPI:  81 y.o. patient is here for short term rehabilitation post hospital admission from 01/16/15-823/16 post fall with right intertrochanteric hip fracture. She underwent ORIF on 01/16/15. She is seen in her room today. She denies any concerns. Her pain is under control with current regimen. She feels tired and would like to take a nap. she has PMH of chronic diastolic CHF, hypertension, atrial fibrillation not on anticoagulation due to fall risk.    Review of Systems:  Constitutional: Negative for fever, chills, diaphoresis.  HENT: Negative for headache, congestion, nasal discharge Respiratory: Negative for shortness of breath and wheezing.  positive for cough, on o2 Cardiovascular: Negative for chest pain, palpitations, leg swelling.  Gastrointestinal: Negative for heartburn, nausea, vomiting, abdominal pain Genitourinary: Negative for dysuria  Musculoskeletal: Negative for back pain, falls Skin: Negative for itching, rash.  Neurological: Negative for dizziness, tingling, focal weakness Psychiatric/Behavioral: Negative for depression   Past Medical History  Diagnosis Date  . CHF (congestive heart failure)   . Pleural effusion   . HTN (hypertension)   . Stroke   . Hyperlipidemia   . Diabetes mellitus without complication   . Compound fracture     left leg  . AAA (abdominal aortic aneurysm)   . Eczema   . Compression fracture     x 2   . Hypertension   . Irregular heart beat    Past Surgical History  Procedure Laterality Date  . Cholecystectomy    . Abdominal hysterectomy    . Knee surgery      right knee   . Corneal transplant     . Femur im nail Right 01/16/2015    Procedure: INTRAMEDULLARY (IM) RETROGRADE FEMORAL NAILING;  Surgeon: Dorna Leitz, MD;  Location: [facility];  Service: Orthopedics;  Laterality: Right;   Social History:   reports that she has never smoked. She does not have any smokeless tobacco history on file. She reports that she does not drink alcohol or use illicit drugs.  Family History  Problem Relation Age of Onset  . Heart attack Mother 83  . Heart disease Mother     Medications:   Medication List       This list is accurate as of: 01/21/15  6:31 PM.  Always use your most recent med list.               acetaminophen 500 MG tablet  Commonly known as:  TYLENOL  Take 500 mg by mouth every 6 (six) hours as needed for mild pain or headache.     acidophilus Caps capsule  Take 1 capsule by mouth 2 (two) times daily.     aspirin EC 325 MG tablet  Take 1 tablet (325 mg total) by mouth daily after breakfast. Take x 1 month post op to decrease risk of blood clots.     calcitonin (salmon) 200 UNIT/ACT nasal spray  Commonly known as:  MIACALCIN/FORTICAL  Place 1 spray into alternate nostrils daily.     CALMOSEPTINE 0.44-20.6 % Oint  Generic drug:  Menthol-Zinc  Oxide  Apply 1 application topically.     digoxin 0.125 MG tablet  Commonly known as:  LANOXIN  Take 0.125 mg by mouth daily.     docusate sodium 100 MG capsule  Commonly known as:  COLACE  Take 100 mg by mouth 2 (two) times daily.     famotidine 20 MG tablet  Commonly known as:  PEPCID  Take 20 mg by mouth at bedtime.     feeding supplement Liqd  Take 1 Container by mouth 3 (three) times daily between meals.     ferrous sulfate 325 (65 FE) MG tablet  Take 1 tablet (325 mg total) by mouth daily with breakfast.     fluticasone 50 MCG/ACT nasal spray  Commonly known as:  FLONASE  Place 1 spray into both nostrils daily.     furosemide 20 MG tablet  Commonly known as:  LASIX  Take 20-40 mg by mouth 2 (two) times daily.  Take 40 mg in the morning and 20mg  in the afternoon     hydrALAZINE 50 MG tablet  Commonly known as:  APRESOLINE  Take 50 mg by mouth every 8 (eight) hours.     loperamide 2 MG tablet  Commonly known as:  IMODIUM A-D  Take 2 mg by mouth 4 (four) times daily as needed for diarrhea or loose stools.     metoprolol tartrate 25 MG tablet  Commonly known as:  LOPRESSOR  Take 25 mg by mouth 2 (two) times daily.     PARoxetine 20 MG tablet  Commonly known as:  PAXIL  Take 20 mg by mouth daily.     potassium chloride SA 20 MEQ tablet  Commonly known as:  K-DUR,KLOR-CON  Take 20 mEq by mouth daily.     PRESERVISION AREDS PO  Take 1 tablet by mouth 2 (two) times daily.     traMADol 50 MG tablet  Commonly known as:  ULTRAM  Take 1 tablet (50 mg total) by mouth every 6 (six) hours as needed for moderate pain.         Physical Exam: Filed Vitals:   01/21/15 1831  BP: 135/64  Pulse: 72  Temp: 97.5 F (36.4 C)  Resp: 18  SpO2: 96%    General- elderly female, frail and thin built, in no acute distress Head- normocephalic, atraumatic Throat- moist mucus membrane  Eyes- PERRLA, EOMI, no pallor, no icterus, no discharge, normal conjunctiva, normal sclera Neck- no cervical lymphadenopathy Cardiovascular- normal s1,s2, no murmurs Respiratory- bilateral clear to auscultation, no wheeze, no rhonchi, no crackles, no use of accessory muscles, on o2 Abdomen- bowel sounds present, soft, non tender Musculoskeletal- able to move all 4 extremities, generalized weakness, right leg ROM limited, on wheelchair Neurological- no focal deficit, alert and oriented to person, place and time Skin- warm and dry, right hip aquacel dressing in place Psychiatry- normal mood and affect    Labs reviewed: Basic Metabolic Panel:  Recent Labs  06/02/14 0414 06/03/14 0419  06/05/14 0445  01/16/15 1207 01/17/15 0549 01/18/15 0830  NA 140 133*  < > 132*  < > 143 143 142  K 3.4* 4.0  < > 3.2*  < >  3.4* 3.6 3.2*  CL 95* 93*  < > 92*  < > 99* 104 102  CO2 40* 30  < > 32  < > 35* 31 32  GLUCOSE 86 85  < > 104*  < > 114* 133* 121*  BUN 22 22  < > 29*  < >  18 19 15   CREATININE 0.72 0.76  < > 0.90  < > 0.70 0.61 0.56  CALCIUM 8.3* 8.4  < > 7.7*  < > 8.6* 8.1* 7.9*  MG 1.7 1.9  --  1.6  --   --   --   --   < > = values in this interval not displayed. Liver Function Tests:  Recent Labs  05/31/14 0050 06/05/14 0445 01/16/15 1207  AST 29 12 21   ALT 13 9 11*  ALKPHOS 57 55 48  BILITOT 1.2 0.8 0.8  PROT 5.9* 5.2* 5.8*  ALBUMIN 2.9* 2.3* 3.2*   No results for input(s): LIPASE, AMYLASE in the last 8760 hours. No results for input(s): AMMONIA in the last 8760 hours. CBC:  Recent Labs  01/16/15 0922 01/16/15 1207 01/17/15 0549 01/18/15 0830  WBC 9.9 12.5* 11.0* 12.0*  NEUTROABS 7.9* 10.7*  --  9.8*  HGB 10.7* 10.4* 7.8* 8.4*  HCT 34.1* 33.3* 24.5* 25.6*  MCV 96.6 97.7 96.8 96.2  PLT 200 214 186 154   Cardiac Enzymes:  Recent Labs  05/30/14 2040  05/31/14 0050 05/31/14 0547 05/31/14 1230  CKTOTAL 147  --   --   --   --   TROPONINI  --   < > 0.26* 0.21* 0.22*  < > = values in this interval not displayed. BNP: Invalid input(s): POCBNP CBG:  Recent Labs  06/07/14 0754 06/08/14 0752 06/09/14 0749  GLUCAP 80 78 100*    Radiological Exams: Dg Chest 1 View  01/16/2015   CLINICAL DATA:  Status post fall onto the right side today. Initial encounter.  EXAM: CHEST  1 VIEW  COMPARISON:  PA and lateral chest 08/27/2014 and 06/05/2014.  FINDINGS: There is marked cardiomegaly and mild interstitial edema. Hazy opacity over the right chest is most consistent with a layering pleural effusion. Smaller left pleural effusion is noted. The patient is rotated on the study. Extensive aortic atherosclerosis is seen. Chronic compression fracture in the lower thoracic spine is noted.  IMPRESSION: Right greater than left pleural effusions and basilar atelectasis.  Cardiomegaly and mild  interstitial edema.   Electronically Signed   By: Inge Rise M.D.   On: 01/16/2015 10:29   Dg Shoulder Right  01/16/2015   CLINICAL DATA:  Fall  EXAM: RIGHT SHOULDER - 2+ VIEW  COMPARISON:  None.  FINDINGS: Osteopenia.  No acute fracture.  No dislocation.  IMPRESSION: No acute bony pathology.   Electronically Signed   By: Marybelle Killings M.D.   On: 01/16/2015 10:29   Dg Knee 1-2 Views Right  01/16/2015   CLINICAL DATA:  Status post fall today. Right knee pain. Initial encounter.  EXAM: RIGHT KNEE - 1-2 VIEW  COMPARISON:  None.  FINDINGS: No acute bony or joint abnormality is identified. Advanced osteoarthritis about the knee appears worst in the medial compartment. Small joint effusion is noted. Atherosclerotic calcifications are seen.  IMPRESSION: No acute abnormality.  Advanced osteoarthritis appears worst medially.   Electronically Signed   By: Inge Rise M.D.   On: 01/16/2015 10:27   Dg C-arm 1-60 Min-no Report  01/16/2015   CLINICAL DATA: FRACTURED RIGHT HIP   C-ARM 1-60 MINUTES  Fluoroscopy was utilized by the requesting physician.  No radiographic  interpretation.    Dg Hip Unilat With Pelvis 2-3 Views Right  01/16/2015   CLINICAL DATA:  Fall  EXAM: DG HIP (WITH OR WITHOUT PELVIS) 2-3V RIGHT  COMPARISON:  None.  FINDINGS: Comminuted right intertrochanteric femur fracture  with varus deformity is noted. Vascular calcifications. Osteopenia. Degenerative changes in the lumbar spine.  IMPRESSION: Comminuted right intertrochanteric femur fracture.   Electronically Signed   By: Marybelle Killings M.D.   On: 01/16/2015 10:29   Dg Femur Port, Min 2 Views Right  01/16/2015   CLINICAL DATA:  ORIF of a comminuted intertrochanteric right femoral neck fracture.  EXAM: OPERATIVE RIGHT HIP (WITH PELVIS IF PERFORMED) 2 VIEWS 1706 through 1717 hr:  TECHNIQUE: Fluoroscopic spot image(s) were submitted for interpretation post-operatively.  COMPARISON:  Preoperative right hip x-rays earlier same date 1003 hr.   FINDINGS: Four spot images from the C-arm fluoroscopic device, AP and lateral views of the right hip to include the distal femur were obtained. Patient has undergone ORIF of the comminuted intertrochanteric right femoral neck fracture with an intramedullary nail and compression screw. Alignment is near anatomic. The lesser trochanter is again noted is a free fragment.  The radiologic technologist documented 1 min 14 sec fluoroscopy time.  IMPRESSION: ORIF of the comminuted intertrochanteric right femoral neck fracture.   Electronically Signed   By: Evangeline Dakin M.D.   On: 01/16/2015 17:23    Assessment/Plan  Unsteady gait With recent hip fracture. Will have patient work with PT/OT as tolerated to regain strength and restore function.  Fall precautions are in place.  Right intertrochanteric hip fracture  S/p ORIF. Continue tramadol 50 mg q6h prn pain. Has follow up with orthopedics. Continue aspirin 325 mg daily for dvt prophylaxis. RLE PWB for now. Will have her work with physical therapy and occupational therapy team to help with gait training and muscle strengthening exercises.fall precautions. Skin care. Encourage to be out of bed.   Blood loss anemia Post op, hemodynamically stable, Monitor h&h, s/p 1 u prbc transfusion in hospital  Leukocytosis Likely reactive in nature, afebrile, monitor clinically  Chronic CHF Stable, continue metoprolol, hydralazine, lasix and kcl supplement, monitor bmp  HTN Stable bp, continue metoprolol 25 mg bid and hydralazine 50 mg tid for now and monitor bp  Atrial fibrillation Rate controlled with metoprolol 25 mg bid and digoxin. Continue aspirin  Constipation continue Colace 100 mg bid for now and monitor   Goals of care: short term rehabilitation   Labs/tests ordered: cbc, bmp  Family/ staff Communication: reviewed care plan with patient and nursing supervisor    Blanchie Serve, MD  [place] [phone] (Monday-Friday 8  am - 5 pm) [phone] (afterhours)

## 2015-02-12 ENCOUNTER — Non-Acute Institutional Stay (SKILLED_NURSING_FACILITY): Payer: Commercial Managed Care - HMO | Admitting: Adult Health

## 2015-02-12 ENCOUNTER — Encounter: Payer: Self-pay | Admitting: Adult Health

## 2015-02-12 DIAGNOSIS — K5900 Constipation, unspecified: Secondary | ICD-10-CM | POA: Diagnosis not present

## 2015-02-12 DIAGNOSIS — J309 Allergic rhinitis, unspecified: Secondary | ICD-10-CM | POA: Diagnosis not present

## 2015-02-12 DIAGNOSIS — E876 Hypokalemia: Secondary | ICD-10-CM | POA: Diagnosis not present

## 2015-02-12 DIAGNOSIS — I482 Chronic atrial fibrillation, unspecified: Secondary | ICD-10-CM

## 2015-02-12 DIAGNOSIS — F329 Major depressive disorder, single episode, unspecified: Secondary | ICD-10-CM | POA: Diagnosis not present

## 2015-02-12 DIAGNOSIS — D62 Acute posthemorrhagic anemia: Secondary | ICD-10-CM

## 2015-02-12 DIAGNOSIS — I5032 Chronic diastolic (congestive) heart failure: Secondary | ICD-10-CM | POA: Diagnosis not present

## 2015-02-12 DIAGNOSIS — S72001S Fracture of unspecified part of neck of right femur, sequela: Secondary | ICD-10-CM | POA: Diagnosis not present

## 2015-02-12 DIAGNOSIS — F32A Depression, unspecified: Secondary | ICD-10-CM

## 2015-02-12 DIAGNOSIS — I10 Essential (primary) hypertension: Secondary | ICD-10-CM

## 2015-02-17 NOTE — Progress Notes (Signed)
Patient ID: Jean Murphy, female   DOB: 05-01-1918, 81 y.o.   MRN: [id number]    DATE:  02/12/15 MRN:  [id number]  BIRTHDAY: 07-15-1917  Facility:  [facility]ing Home Location:  [place] and [place] Room Number: 937-1  LEVEL OF CARE:  SNF [phone])  Contact Information    Name Relation Home Work [place] Daughter [phone]   [facility] Other [place]   Damaris Hippo   [phone]       Chief Complaint  Patient presents with  . Discharge Note    Right intertrochanteric hip fracture S/P ORIF, atrial fibrillation, hypertension, CHF, depression, hypokalemia, anemia, constipation and allergic rhinitis   HISTORY OF PRESENT ILLNESS:  This is a 81 year old female who is for discharge to [facility] with home health PT for endurance, OT for ADLs and CNA for showers. DME:   standard wheelchair with leg rests, anti-tippers and cushion. She has been admitted to [facility] on 01/19/15 from [facility]. She has PMH of chronic diastolic CHF, hypertension, atrial fibrillation not on anticoagulation due to risk for falls and depression. She had a fall and sustained a right intertrochanteric hip fracture. She had ORIF on 01/16/15.  Patient was admitted to this facility for short-term rehabilitation after the patient's recent hospitalization.  Patient has completed SNF rehabilitation and therapy has cleared the patient for discharge.   PAST MEDICAL HISTORY:  Past Medical History  Diagnosis Date  . CHF (congestive heart failure)   . Pleural effusion   . HTN (hypertension)   . Stroke   . Hyperlipidemia   . Diabetes mellitus without complication   . Compound fracture     left leg  . AAA (abdominal aortic aneurysm)   . Eczema   . Compression fracture     x 2   . Hypertension   . Irregular heart beat     CURRENT MEDICATIONS: Reviewed  Patient's Medications  New  Prescriptions   No medications on file  Previous Medications   ACETAMINOPHEN (TYLENOL) 500 MG TABLET    Take 500 mg by mouth every 6 (six) hours as needed for mild pain or headache.    ACIDOPHILUS (RISAQUAD) CAPS CAPSULE    Take 1 capsule by mouth 2 (two) times daily.   ASPIRIN EC 325 MG TABLET    Take 1 tablet (325 mg total) by mouth daily after breakfast. Take x 1 month post op to decrease risk of blood clots.   CALCITONIN, SALMON, (MIACALCIN/FORTICAL) 200 UNIT/ACT NASAL SPRAY    Place 1 spray into alternate nostrils daily.   DIGOXIN (LANOXIN) 0.125 MG TABLET    Take 0.125 mg by mouth daily.   DOCUSATE SODIUM (COLACE) 100 MG CAPSULE    Take 100 mg by mouth 2 (two) times daily.   FAMOTIDINE (PEPCID) 20 MG TABLET    Take 20 mg by mouth at bedtime.   FEEDING SUPPLEMENT (BOOST / RESOURCE BREEZE) LIQD    Take 1 Container by mouth 3 (three) times daily between meals.   FERROUS SULFATE 325 (65 FE) MG TABLET    Take 1 tablet (325 mg total) by mouth daily with breakfast.   FLUTICASONE (FLONASE) 50 MCG/ACT NASAL SPRAY    Place 1 spray into both nostrils daily.    FUROSEMIDE (LASIX) 20 MG TABLET    Take 20-40 mg by mouth 2 (two) times daily. Take 40 mg in the morning and 20mg  in the afternoon   HYDRALAZINE (APRESOLINE)  50 MG TABLET    Take 50 mg by mouth every 8 (eight) hours.   LOPERAMIDE (IMODIUM A-D) 2 MG TABLET    Take 2 mg by mouth 4 (four) times daily as needed for diarrhea or loose stools.   MENTHOL-ZINC OXIDE (CALMOSEPTINE) 0.44-20.6 % OINT    Apply 1 application topically.   METOPROLOL TARTRATE (LOPRESSOR) 25 MG TABLET    Take 25 mg by mouth 2 (two) times daily.   MULTIPLE VITAMINS-MINERALS (PRESERVISION AREDS PO)    Take 1 tablet by mouth 2 (two) times daily.   PAROXETINE (PAXIL) 20 MG TABLET    Take 20 mg by mouth daily.   POTASSIUM CHLORIDE SA (K-DUR,KLOR-CON) 20 MEQ TABLET    Take 20 mEq by mouth daily.   TRAMADOL (ULTRAM) 50 MG TABLET    Take 1 tablet (50 mg total) by mouth every 6 (six)  hours as needed for moderate pain.  Modified Medications   No medications on file  Discontinued Medications   No medications on file     Allergies  Allergen Reactions  . Penicillins     unknown  . Shellfish Allergy     Hives       REVIEW OF SYSTEMS:  GENERAL: no change in appetite, no fatigue, no weight changes, no fever, chills or weakness EYES: Denies change in vision, dry eyes, eye pain, itching or discharge EARS: Denies change in hearing, ringing in ears, or earache NOSE: Denies nasal congestion or epistaxis MOUTH and THROAT: Denies oral discomfort, gingival pain or bleeding, pain from teeth or hoarseness   RESPIRATORY: no cough, SOB, DOE, wheezing, hemoptysis CARDIAC: no chest pain, edema or palpitations GI: no abdominal pain, diarrhea, constipation, heart burn, nausea or vomiting GU: Denies dysuria, frequency, hematuria, incontinence, or discharge PSYCHIATRIC: Denies feeling of depression or anxiety. No report of hallucinations, insomnia, paranoia, or agitation   PHYSICAL EXAMINATION  GENERAL APPEARANCE: Well nourished. In no acute distress. Normal body habitus SKIN:  Right upper and lower hip surgical site is  dry and no erythema HEAD: Normal in size and contour. No evidence of trauma EYES: Lids open and close normally. No blepharitis, entropion or ectropion. PERRL. Conjunctivae are clear and sclerae are white. Lenses are without opacity EARS: Pinnae are normal. Patient hears normal voice tunes of the examiner MOUTH and THROAT: Lips are without lesions. Oral mucosa is moist and without lesions. Tongue is normal in shape, size, and color and without lesions NECK: supple, trachea midline, no neck masses, no thyroid tenderness, no thyromegaly LYMPHATICS: no LAN in the neck, no supraclavicular LAN RESPIRATORY: breathing is even & unlabored, BS CTAB CARDIAC: RRR, no murmur,no extra heart sounds, no edema GI: abdomen soft, normal BS, no masses, no tenderness, no  hepatomegaly, no splenomegaly EXTREMITIES:  Able to move x 4 extremities PSYCHIATRIC: Alert and oriented X 3. Affect and behavior are appropriate  LABS/RADIOLOGY: Labs reviewed: 01/21/15  WBC 9.6 hemoglobin 8.6 hematocrit and 6.3 MCV 93.6 platelet count 244 sodium 143 potassium 3.5 glucose 86 BUN 20 creatinine 0.64 calcium 8.2 Basic Metabolic Panel:  Recent Labs  06/02/14 0414 06/03/14 0419  06/05/14 0445  01/16/15 1207 01/17/15 0549 01/18/15 0830  NA 140 133*  < > 132*  < > 143 143 142  K 3.4* 4.0  < > 3.2*  < > 3.4* 3.6 3.2*  CL 95* 93*  < > 92*  < > 99* 104 102  CO2 40* 30  < > 32  < > 35* 31 32  GLUCOSE  86 85  < > 104*  < > 114* 133* 121*  BUN 22 22  < > 29*  < > 18 19 15   CREATININE 0.72 0.76  < > 0.90  < > 0.70 0.61 0.56  CALCIUM 8.3* 8.4  < > 7.7*  < > 8.6* 8.1* 7.9*  MG 1.7 1.9  --  1.6  --   --   --   --   < > = values in this interval not displayed. Liver Function Tests:  Recent Labs  05/31/14 0050 06/05/14 0445 01/16/15 1207  AST 29 12 21   ALT 13 9 11*  ALKPHOS 57 55 48  BILITOT 1.2 0.8 0.8  PROT 5.9* 5.2* 5.8*  ALBUMIN 2.9* 2.3* 3.2*   CBC:  Recent Labs  01/16/15 0922 01/16/15 1207 01/17/15 0549 01/18/15 0830  WBC 9.9 12.5* 11.0* 12.0*  NEUTROABS 7.9* 10.7*  --  9.8*  HGB 10.7* 10.4* 7.8* 8.4*  HCT 34.1* 33.3* 24.5* 25.6*  MCV 96.6 97.7 96.8 96.2  PLT 200 214 186 154   Cardiac Enzymes:  Recent Labs  05/30/14 2040  05/31/14 0050 05/31/14 0547 05/31/14 1230  CKTOTAL 147  --   --   --   --   TROPONINI  --   < > 0.26* 0.21* 0.22*  < > = values in this interval not displayed.  CBG:  Recent Labs  06/07/14 0754 06/08/14 0752 06/09/14 0749  GLUCAP 80 78 100*      No results found.  ASSESSMENT/PLAN:   Right intertrochanteric hip fracture S/P ORIF  -   for Home health PT for endurance, OT for ADLs and CNA for showers; continue Tramadol 50 mg 1 tab PO Q 6 hours for pain; ASA 325 mg 1 tab PO Q D for DVT prophylaxis; follow-up with Dr.  Berenice Primas, orthopedic surgeon  Atrial fibrillation - not on anticoagulation due to fall risk; continue Metoprolol, Digoxin and ASA  Hypertension - continue Metoprolol and Hydralazine  Chronic diastolic CHF - continue Digoxin, Metoprolol, Lasix and KCL  Depression - mood is stable; continue Paxil 20 mg 1 tab PO daily  Hypokalemia - K 3.5; continue KCL supplement  Anemia, acute blood loss - S/P transfusion 1 unit PRBC; hgb 8.6  Constipation -  Continue Colace 100 mg 1 capsule PO BID  Allergic rhinitis:  Continue Flonase 50 mcg/ACT 1 spray into both nostril daily     I have filled out patient's discharge paperwork and written prescriptions.  Patient will receive home health PT, OT and CNA.  DME provided:  standard wheelchair with leg rests, anti-tippers and cushion  Total discharge time: Greater than 30 minutes  Discharge time involved coordination of the discharge process with social worker, nursing staff and therapy department. Medical justification for home health services/DME verified.     [facility], NP [employer] [phone]

## 2015-03-05 ENCOUNTER — Emergency Department (HOSPITAL_COMMUNITY): Payer: Commercial Managed Care - HMO

## 2015-03-05 ENCOUNTER — Encounter (HOSPITAL_COMMUNITY): Payer: Self-pay | Admitting: *Deleted

## 2015-03-05 ENCOUNTER — Emergency Department (HOSPITAL_COMMUNITY)
Admission: EM | Admit: 2015-03-05 | Discharge: 2015-03-05 | Disposition: A | Discharge status: Home / Self Care | Payer: Commercial Managed Care - HMO | Attending: Physician Assistant | Admitting: Physician Assistant

## 2015-03-05 DIAGNOSIS — E119 Type 2 diabetes mellitus without complications: Secondary | ICD-10-CM | POA: Insufficient documentation

## 2015-03-05 DIAGNOSIS — J189 Pneumonia, unspecified organism: Secondary | ICD-10-CM

## 2015-03-05 DIAGNOSIS — Z872 Personal history of diseases of the skin and subcutaneous tissue: Secondary | ICD-10-CM | POA: Insufficient documentation

## 2015-03-05 DIAGNOSIS — Z79899 Other long term (current) drug therapy: Secondary | ICD-10-CM | POA: Insufficient documentation

## 2015-03-05 DIAGNOSIS — I509 Heart failure, unspecified: Secondary | ICD-10-CM | POA: Insufficient documentation

## 2015-03-05 DIAGNOSIS — Z7951 Long term (current) use of inhaled steroids: Secondary | ICD-10-CM | POA: Insufficient documentation

## 2015-03-05 DIAGNOSIS — Z8673 Personal history of transient ischemic attack (TIA), and cerebral infarction without residual deficits: Secondary | ICD-10-CM | POA: Diagnosis not present

## 2015-03-05 DIAGNOSIS — Z7982 Long term (current) use of aspirin: Secondary | ICD-10-CM | POA: Diagnosis not present

## 2015-03-05 DIAGNOSIS — R0902 Hypoxemia: Secondary | ICD-10-CM | POA: Diagnosis present

## 2015-03-05 DIAGNOSIS — Z87828 Personal history of other (healed) physical injury and trauma: Secondary | ICD-10-CM | POA: Insufficient documentation

## 2015-03-05 DIAGNOSIS — Z880 Allergy status to penicillin: Secondary | ICD-10-CM | POA: Diagnosis not present

## 2015-03-05 DIAGNOSIS — J158 Pneumonia due to other specified bacteria: Secondary | ICD-10-CM | POA: Diagnosis not present

## 2015-03-05 DIAGNOSIS — I10 Essential (primary) hypertension: Secondary | ICD-10-CM | POA: Diagnosis not present

## 2015-03-05 MED ORDER — AZITHROMYCIN 250 MG PO TABS
250.0000 mg | ORAL_TABLET | Freq: Once | ORAL | Status: DC
  Filled 2015-03-05: qty 1

## 2015-03-05 MED ORDER — AZITHROMYCIN 250 MG PO TABS: 250.0000 mg | ORAL_TABLET | Freq: Every day | ORAL | Status: DC

## 2015-03-05 MED ORDER — AZITHROMYCIN 250 MG PO TABS: 250.0000 mg | ORAL_TABLET | Freq: Once | ORAL | Status: DC

## 2015-03-05 MED ORDER — AZITHROMYCIN 250 MG PO TABS
1000.0000 mg | ORAL_TABLET | Freq: Once | ORAL | Status: AC
  Administered 2015-03-05: 1000 mg via ORAL
  Filled 2015-03-05: qty 4

## 2015-03-05 NOTE — ED Notes (Signed)
[facility] called for transport.  Patient to return to [facility].  Patient and daughter informed.

## 2015-03-05 NOTE — ED Notes (Signed)
All paperwork for [facility] is at Nurses station in black bin

## 2015-03-05 NOTE — ED Notes (Signed)
MD at bedside. 

## 2015-03-05 NOTE — ED Notes (Signed)
Patient transported to X-ray 

## 2015-03-05 NOTE — ED Provider Notes (Signed)
CSN: [id number]     Arrival date & time 03/05/15  1150 History   First MD Initiated Contact with Patient 03/05/15 1308     Chief Complaint  Patient presents with  . low oxygen levels      (Consider location/radiation/quality/duration/timing/severity/associated sxs/prior Treatment) HPI  Patient is a very pleasant 81 year old female presenting from nursing home. Patient has history of CHF hypertension stroke AAA diabetes presenting today from nursing home. Nursing home reportedly found her oxygen to be 88 this morning after trying to wake her up. It is been above 95 for EMS and above 95 entire time she's been here.  Patient's had no cough, no increase in sputum no fever or shortness of breath.   Past Medical History  Diagnosis Date  . CHF (congestive heart failure) ([place])   . Pleural effusion   . HTN (hypertension)   . Stroke ([place])   . Hyperlipidemia   . Diabetes mellitus without complication ([place])   . Compound fracture     left leg  . AAA (abdominal aortic aneurysm) ([place])   . Eczema   . Compression fracture     x 2   . Hypertension   . Irregular heart beat    Past Surgical History  Procedure Laterality Date  . Cholecystectomy    . Abdominal hysterectomy    . Knee surgery      right knee   . Corneal transplant    . Femur im nail Right 01/16/2015    Procedure: INTRAMEDULLARY (IM) RETROGRADE FEMORAL NAILING;  Surgeon: Dorna Leitz, MD;  Location: [facility];  Service: Orthopedics;  Laterality: Right;   Family History  Problem Relation Age of Onset  . Heart attack Mother 36  . Heart disease Mother    Social History  Substance Use Topics  . Smoking status: Never Smoker   . Smokeless tobacco: None  . Alcohol Use: No   OB History    Gravida Para Term Preterm AB TAB SAB Ectopic Multiple Living   0 0 0 0 0 0 0 0       Review of Systems  Constitutional: Negative for activity change and fatigue.  HENT: Negative for congestion and drooling.   Eyes: Negative for discharge.   Respiratory: Negative for cough, chest tightness, shortness of breath and wheezing.   Cardiovascular: Negative for chest pain and palpitations.  Gastrointestinal: Negative for abdominal distention.  Genitourinary: Negative for dysuria and difficulty urinating.  Musculoskeletal: Negative for joint swelling.  Skin: Negative for rash.  Allergic/Immunologic: Negative for immunocompromised state.  Neurological: Negative for dizziness and headaches.  Psychiatric/Behavioral: Negative for behavioral problems and agitation.      Allergies  Penicillins and Shellfish allergy  Home Medications   Prior to Admission medications   Medication Sig Start Date End Date Taking? Authorizing Provider  acetaminophen (TYLENOL) 500 MG tablet Take 500 mg by mouth every 6 (six) hours as needed (For pain. Not to exceed 4 tabs in 24 hours.).    Yes Historical Provider, MD  acidophilus (RISAQUAD) CAPS capsule Take 1 capsule by mouth 2 (two) times daily. 06/09/14  Yes Nita Sells, MD  aspirin EC 325 MG tablet Take 1 tablet (325 mg total) by mouth daily after breakfast. Take x 1 month post op to decrease risk of blood clots. 01/16/15  Yes Gary Fleet, PA-C  calcitonin, salmon, (MIACALCIN/FORTICAL) 200 UNIT/ACT nasal spray Place 1 spray into alternate nostrils daily.   Yes Historical Provider, MD  digoxin (LANOXIN) 0.125 MG tablet Take 0.125 mg by  mouth daily.   Yes Historical Provider, MD  docusate sodium (COLACE) 100 MG capsule Take 100 mg by mouth daily.    Yes Historical Provider, MD  famotidine (PEPCID) 20 MG tablet Take 20 mg by mouth at bedtime.   Yes Historical Provider, MD  feeding supplement (BOOST / RESOURCE BREEZE) LIQD Take 1 Container by mouth 3 (three) times daily between meals. 01/19/15  Yes Robbie Lis, MD  ferrous sulfate 325 (65 FE) MG tablet Take 1 tablet (325 mg total) by mouth daily with breakfast. 01/19/15  Yes Robbie Lis, MD  fluticasone [facility]) 50 MCG/ACT nasal spray Place 1 spray  into both nostrils daily.  04/07/14  Yes Historical Provider, MD  furosemide (LASIX) 20 MG tablet Take 20-40 mg by mouth 2 (two) times daily. She takes two tablets in the morning and one tablet at bedtime.   Yes Historical Provider, MD  hydrALAZINE (APRESOLINE) 50 MG tablet Take 50 mg by mouth every 8 (eight) hours.   Yes Historical Provider, MD  loperamide (IMODIUM) 2 MG capsule Take 2 mg by mouth daily as needed. 02/17/15  Yes Historical Provider, MD  metoprolol tartrate (LOPRESSOR) 25 MG tablet Take 25 mg by mouth 2 (two) times daily.   Yes Historical Provider, MD  Multiple Vitamins-Minerals (PRESERVISION AREDS PO) Take 1 tablet by mouth 2 (two) times daily.   Yes Historical Provider, MD  PARoxetine (PAXIL) 20 MG tablet Take 20 mg by mouth daily.   Yes Historical Provider, MD  potassium chloride SA (K-DUR,KLOR-CON) 20 MEQ tablet Take 20 mEq by mouth daily.   Yes Historical Provider, MD  protein supplement (RESOURCE BENEPROTEIN) POWD Take 1 scoop by mouth 2 (two) times daily.   Yes Historical Provider, MD  traMADol (ULTRAM) 50 MG tablet Take 1 tablet (50 mg total) by mouth every 6 (six) hours as needed for moderate pain. 01/16/15  Yes Gary Fleet, PA-C   BP 151/74 mmHg  Pulse 63  Temp(Src) 98.1 F (36.7 C) (Oral)  Resp 18  SpO2 97% Physical Exam  Constitutional: She is oriented to person, place, and time. She appears well-developed and well-nourished.  HENT:  Head: Normocephalic and atraumatic.  Eyes: Conjunctivae are normal. Right eye exhibits no discharge.  Neck: Neck supple.  Cardiovascular: Normal rate, regular rhythm and normal heart sounds.   No murmur heard. Pulmonary/Chest: Effort normal and breath sounds normal. She has no wheezes. She has no rales. She exhibits no tenderness.  Abdominal: Soft. She exhibits no distension. There is no tenderness.  Musculoskeletal: Normal range of motion. She exhibits no edema.  Neurological: She is oriented to person, place, and time. No cranial  nerve deficit.  Skin: Skin is warm and dry. No rash noted. She is not diaphoretic.  Psychiatric: She has a normal mood and affect. Her behavior is normal.  Nursing note and vitals reviewed.   ED Course  Procedures (including critical care time) Labs Review Labs Reviewed - No data to display  Imaging Review No results found. I have personally reviewed and evaluated these images and lab results as part of my medical decision-making.   EKG Interpretation None      MDM   Final diagnoses:  None    Patient is a 81 year old from nursing home presenting with  hypoxia noted by nursing home. According to nursing home they woke her up and put her on the oxygen saturation monitor and found her to be 88%. She is not having shortness breath or chest pain. She has not had  any shortness of breath nor any chest pain nor any cough nor any fever. Patient's breathing normally on room air at 95%. She has been breathing that way for the last 2 hours and in the emergency room. EMS noted her to be above 95% when they arrived as well.  Patient's here with daughter. We agree that we'll do single chest x-ray.   Plan to have her follow-up with her primary care physician.    Zelene Barga Julio Alm, MD 03/05/15 1441

## 2015-03-05 NOTE — Discharge Instructions (Signed)

## 2015-03-05 NOTE — ED Notes (Signed)
[facility] notified of patients return to their facility.

## 2015-03-05 NOTE — ED Notes (Addendum)
Pt appears comfortable. Her HOB is elevated almost 90 degrees. Pts daughter is at the bedside.  [facility] sent a 250mg  zithromycin for the pt to take back to the nursing facilty. Pt is being transported via [place]. Pt is stable. Pt is alert to person and time only.6p-Nursing facility phoned back and questioned the z-pack order. MD made aware and will contact the facility.

## 2015-03-05 NOTE — ED Notes (Signed)
Family at bedside. 

## 2015-03-05 NOTE — Progress Notes (Signed)
[facility] received consult from EDP regarding prescription assistance.  Patient's facility reports prescription for zithromax not written correctly and requesting new prescription.  EDP placed new prescription for zithromax.  [facility] faxed RX to [facility] of  [place] with confirmation of receipt.  EDCM spoke to patient's RN Karena Addison who confirmed receipt of prescription and that was written correctly.  No further EDCM needs at this time.

## 2015-03-05 NOTE — ED Notes (Signed)
Pt is from [place].  Staff states that pts oxygen levels dropped to 88%.  EMS states that her 02 sats have been 97-98% on room air.  Patient has no complaints.  She is not short of breath.  LS clear

## 2015-04-06 ENCOUNTER — Other Ambulatory Visit: Payer: Self-pay | Admitting: Family Medicine

## 2015-04-06 ENCOUNTER — Ambulatory Visit
Admission: RE | Admit: 2015-04-06 | Discharge: 2015-04-06 | Disposition: A | Discharge status: Home / Self Care | Payer: Commercial Managed Care - HMO | Source: Ambulatory Visit | Attending: Family Medicine | Admitting: Family Medicine

## 2015-04-06 DIAGNOSIS — J690 Pneumonitis due to inhalation of food and vomit: Secondary | ICD-10-CM

## 2015-05-30 ENCOUNTER — Emergency Department (HOSPITAL_COMMUNITY)
Admission: EM | Admit: 2015-05-30 | Discharge: 2015-05-30 | Disposition: A | Discharge status: Home / Self Care | Payer: Commercial Managed Care - HMO | Attending: Emergency Medicine | Admitting: Emergency Medicine

## 2015-05-30 ENCOUNTER — Emergency Department (HOSPITAL_COMMUNITY): Payer: Commercial Managed Care - HMO

## 2015-05-30 ENCOUNTER — Encounter (HOSPITAL_COMMUNITY): Payer: Self-pay

## 2015-05-30 DIAGNOSIS — R410 Disorientation, unspecified: Secondary | ICD-10-CM | POA: Insufficient documentation

## 2015-05-30 DIAGNOSIS — Z7951 Long term (current) use of inhaled steroids: Secondary | ICD-10-CM | POA: Diagnosis not present

## 2015-05-30 DIAGNOSIS — R05 Cough: Secondary | ICD-10-CM | POA: Insufficient documentation

## 2015-05-30 DIAGNOSIS — I10 Essential (primary) hypertension: Secondary | ICD-10-CM | POA: Insufficient documentation

## 2015-05-30 DIAGNOSIS — I509 Heart failure, unspecified: Secondary | ICD-10-CM | POA: Diagnosis not present

## 2015-05-30 DIAGNOSIS — Z8781 Personal history of (healed) traumatic fracture: Secondary | ICD-10-CM | POA: Diagnosis not present

## 2015-05-30 DIAGNOSIS — R197 Diarrhea, unspecified: Secondary | ICD-10-CM | POA: Insufficient documentation

## 2015-05-30 DIAGNOSIS — E119 Type 2 diabetes mellitus without complications: Secondary | ICD-10-CM | POA: Diagnosis not present

## 2015-05-30 DIAGNOSIS — Z872 Personal history of diseases of the skin and subcutaneous tissue: Secondary | ICD-10-CM | POA: Insufficient documentation

## 2015-05-30 DIAGNOSIS — Z8673 Personal history of transient ischemic attack (TIA), and cerebral infarction without residual deficits: Secondary | ICD-10-CM | POA: Diagnosis not present

## 2015-05-30 DIAGNOSIS — N130 Hydronephrosis with ureteropelvic junction obstruction: Secondary | ICD-10-CM | POA: Insufficient documentation

## 2015-05-30 DIAGNOSIS — Z79899 Other long term (current) drug therapy: Secondary | ICD-10-CM | POA: Diagnosis not present

## 2015-05-30 DIAGNOSIS — Z8709 Personal history of other diseases of the respiratory system: Secondary | ICD-10-CM | POA: Insufficient documentation

## 2015-05-30 DIAGNOSIS — Z87442 Personal history of urinary calculi: Secondary | ICD-10-CM | POA: Diagnosis not present

## 2015-05-30 DIAGNOSIS — Z880 Allergy status to penicillin: Secondary | ICD-10-CM | POA: Insufficient documentation

## 2015-05-30 DIAGNOSIS — R112 Nausea with vomiting, unspecified: Secondary | ICD-10-CM | POA: Diagnosis present

## 2015-05-30 DIAGNOSIS — R5383 Other fatigue: Secondary | ICD-10-CM | POA: Insufficient documentation

## 2015-05-30 DIAGNOSIS — Q6211 Congenital occlusion of ureteropelvic junction: Secondary | ICD-10-CM

## 2015-05-30 LAB — CBC WITH DIFFERENTIAL/PLATELET
BASOS ABS: 0 10*3/uL (ref 0.0–0.1)
BASOS PCT: 0 %
EOS ABS: 0.2 10*3/uL (ref 0.0–0.7)
EOS PCT: 3 %
HCT: 41.7 % (ref 36.0–46.0)
Hemoglobin: 13.7 g/dL (ref 12.0–15.0)
Lymphocytes Relative: 9 %
Lymphs Abs: 0.7 10*3/uL (ref 0.7–4.0)
MCH: 31.1 pg (ref 26.0–34.0)
MCHC: 32.9 g/dL (ref 30.0–36.0)
MCV: 94.6 fL (ref 78.0–100.0)
MONO ABS: 0.7 10*3/uL (ref 0.1–1.0)
MONOS PCT: 10 %
NEUTROS ABS: 5.7 10*3/uL (ref 1.7–7.7)
Neutrophils Relative %: 78 %
PLATELETS: 239 10*3/uL (ref 150–400)
RBC: 4.41 MIL/uL (ref 3.87–5.11)
RDW: 14.4 % (ref 11.5–15.5)
WBC: 7.4 10*3/uL (ref 4.0–10.5)

## 2015-05-30 LAB — URINALYSIS, ROUTINE W REFLEX MICROSCOPIC
BILIRUBIN URINE: NEGATIVE
Glucose, UA: NEGATIVE mg/dL
Hgb urine dipstick: NEGATIVE
Ketones, ur: NEGATIVE mg/dL
Leukocytes, UA: NEGATIVE
NITRITE: NEGATIVE
PH: 5.5 (ref 5.0–8.0)
Protein, ur: NEGATIVE mg/dL
SPECIFIC GRAVITY, URINE: 1.016 (ref 1.005–1.030)

## 2015-05-30 LAB — COMPREHENSIVE METABOLIC PANEL
ALBUMIN: 3.2 g/dL — AB (ref 3.5–5.0)
ALK PHOS: 67 U/L (ref 38–126)
ALT: 9 U/L — AB (ref 14–54)
ANION GAP: 10 (ref 5–15)
AST: 24 U/L (ref 15–41)
BILIRUBIN TOTAL: 0.8 mg/dL (ref 0.3–1.2)
BUN: 26 mg/dL — AB (ref 6–20)
CALCIUM: 8.5 mg/dL — AB (ref 8.9–10.3)
CO2: 30 mmol/L (ref 22–32)
CREATININE: 0.76 mg/dL (ref 0.44–1.00)
Chloride: 101 mmol/L (ref 101–111)
GFR calc Af Amer: >60 mL/min (ref >60)
GFR calc non Af Amer: >60 mL/min (ref >60)
GLUCOSE: 103 mg/dL — AB (ref 65–99)
Potassium: 3.9 mmol/L (ref 3.5–5.1)
Sodium: 141 mmol/L (ref 135–145)
TOTAL PROTEIN: 6.7 g/dL (ref 6.5–8.1)

## 2015-05-30 LAB — LIPASE, BLOOD: LIPASE: 28 U/L (ref 11–51)

## 2015-05-30 MED ORDER — SODIUM CHLORIDE 0.9 % IV BOLUS (SEPSIS)
500.0000 mL | Freq: Once | INTRAVENOUS | Status: AC
  Administered 2015-05-30: 500 mL via INTRAVENOUS

## 2015-05-30 NOTE — ED Notes (Signed)
Awake. Verbally responsive. A/O x4. Resp even and unlabored. No audible adventitious breath sounds noted. ABC's intact.  

## 2015-05-30 NOTE — ED Provider Notes (Signed)
CSN: [id number]     Arrival date & time 05/30/15  0944 History   First MD Initiated Contact with Patient 05/30/15 [phone]     Chief Complaint  Patient presents with  . Urinary Tract Infection  . Emesis     (Consider location/radiation/quality/duration/timing/severity/associated sxs/prior Treatment) HPI Comments: Patient with a history of CHF, hypertension, stroke, hyperlipidemia and diabetes presents with nausea and vomiting. She was diagnosed with a urinary tract infection 3 days ago. She had been having some intermittent confusion. She's had similar symptoms in the past with UTIs. She was started on Macrobid. She started having nausea vomiting and diarrhea yesterday. She's only having rare loose stools. She had ongoing nausea this morning. She also had some hallucinations where she was seeing men in her room and cats in her room last night.  She was sent over here for further evaluation. She also is complaining some increased pain in her abdomen. She states it's mostly on the left side and goes around toward her back. She has a little bit of a cough. She has a history of aspiration pneumonia. There is no known fevers.  Patient is a 82 y.o. female presenting with urinary tract infection and vomiting.  Urinary Tract Infection Associated symptoms: abdominal pain, nausea and vomiting   Associated symptoms: no fever and no flank pain   Emesis Associated symptoms: abdominal pain and diarrhea   Associated symptoms: no arthralgias, no chills and no headaches     Past Medical History  Diagnosis Date  . CHF (congestive heart failure) ([place])   . Pleural effusion   . HTN (hypertension)   . Stroke ([place])   . Hyperlipidemia   . Diabetes mellitus without complication ([place])   . Compound fracture     left leg  . AAA (abdominal aortic aneurysm) ([place])   . Eczema   . Compression fracture     x 2   . Hypertension   . Irregular heart beat    Past Surgical History  Procedure Laterality Date  .  Cholecystectomy    . Abdominal hysterectomy    . Knee surgery      right knee   . Corneal transplant    . Femur im nail Right 01/16/2015    Procedure: INTRAMEDULLARY (IM) RETROGRADE FEMORAL NAILING;  Surgeon: Dorna Leitz, MD;  Location: [facility];  Service: Orthopedics;  Laterality: Right;   Family History  Problem Relation Age of Onset  . Heart attack Mother 23  . Heart disease Mother    Social History  Substance Use Topics  . Smoking status: Never Smoker   . Smokeless tobacco: None  . Alcohol Use: No   OB History    Gravida Para Term Preterm AB TAB SAB Ectopic Multiple Living   0 0 0 0 0 0 0 0       Review of Systems  Constitutional: Positive for fatigue. Negative for fever, chills and diaphoresis.  HENT: Negative for congestion, rhinorrhea and sneezing.   Eyes: Negative.   Respiratory: Positive for cough. Negative for chest tightness and shortness of breath.   Cardiovascular: Negative for chest pain and leg swelling.  Gastrointestinal: Positive for nausea, vomiting, abdominal pain and diarrhea. Negative for blood in stool.  Genitourinary: Negative for frequency, hematuria, flank pain and difficulty urinating.  Musculoskeletal: Negative for back pain and arthralgias.  Skin: Negative for rash.  Neurological: Negative for dizziness, speech difficulty, weakness, numbness and headaches.  Psychiatric/Behavioral: Positive for confusion.      Allergies  Penicillins and  Shellfish allergy  Home Medications   Prior to Admission medications   Medication Sig Start Date End Date Taking? Authorizing Provider  acetaminophen (TYLENOL) 500 MG tablet Take 500 mg by mouth every 6 (six) hours as needed (For pain. Not to exceed 4 tabs in 24 hours.).    Yes Historical Provider, MD  acidophilus (RISAQUAD) CAPS capsule Take 1 capsule by mouth 2 (two) times daily. Patient taking differently: Take 1 capsule by mouth daily.  06/09/14  Yes Nita Sells, MD  calcitonin, salmon,  (MIACALCIN/FORTICAL) 200 UNIT/ACT nasal spray Place 1 spray into alternate nostrils daily.   Yes Historical Provider, MD  digoxin (LANOXIN) 0.125 MG tablet Take 0.125 mg by mouth daily.   Yes Historical Provider, MD  docusate sodium (COLACE) 100 MG capsule Take 100 mg by mouth daily.    Yes Historical Provider, MD  famotidine (PEPCID) 20 MG tablet Take 20 mg by mouth at bedtime.   Yes Historical Provider, MD  feeding supplement (BOOST / RESOURCE BREEZE) LIQD Take 1 Container by mouth 3 (three) times daily between meals. 01/19/15  Yes Robbie Lis, MD  ferrous sulfate 325 (65 FE) MG tablet Take 1 tablet (325 mg total) by mouth daily with breakfast. 01/19/15  Yes Robbie Lis, MD  fluticasone [facility]) 50 MCG/ACT nasal spray Place 1 spray into both nostrils daily.  04/07/14  Yes Historical Provider, MD  furosemide (LASIX) 20 MG tablet Take 20-40 mg by mouth 2 (two) times daily. Two tablet by mouth in the morning and one tablet at noon.   Yes Historical Provider, MD  hydrALAZINE (APRESOLINE) 50 MG tablet Take 50 mg by mouth every 8 (eight) hours.   Yes Historical Provider, MD  loperamide (IMODIUM) 2 MG capsule Take 2 mg by mouth daily as needed. 02/17/15  Yes Historical Provider, MD  metoprolol tartrate (LOPRESSOR) 25 MG tablet Take 25 mg by mouth 2 (two) times daily.   Yes Historical Provider, MD  Multiple Vitamins-Minerals (PRESERVISION AREDS PO) Take 1 tablet by mouth 2 (two) times daily.   Yes Historical Provider, MD  nitrofurantoin, macrocrystal-monohydrate, (MACROBID) 100 MG capsule Take 100 mg by mouth every 12 (twelve) hours. x7 days   Yes Historical Provider, MD  PARoxetine (PAXIL) 20 MG tablet Take 20 mg by mouth daily.   Yes Historical Provider, MD  potassium chloride SA (K-DUR,KLOR-CON) 20 MEQ tablet Take 20 mEq by mouth daily.   Yes Historical Provider, MD  protein supplement (RESOURCE BENEPROTEIN) POWD Take 1 scoop by mouth 2 (two) times daily.   Yes Historical Provider, MD  traMADol  (ULTRAM) 50 MG tablet Take 1 tablet (50 mg total) by mouth every 6 (six) hours as needed for moderate pain. 01/16/15  Yes Gary Fleet, PA-C  aspirin EC 325 MG tablet Take 1 tablet (325 mg total) by mouth daily after breakfast. Take x 1 month post op to decrease risk of blood clots. Patient not taking: Reported on 05/30/2015 01/16/15   Gary Fleet, PA-C   BP 153/66 mmHg  Pulse 52  Temp(Src) 97.6 F (36.4 C) (Oral)  Resp 17  SpO2 93% Physical Exam  Constitutional: She is oriented to person, place, and time. She appears well-developed and well-nourished.  HENT:  Head: Normocephalic and atraumatic.  Eyes: Pupils are equal, round, and reactive to light.  Neck: Normal range of motion. Neck supple.  Cardiovascular: Normal rate, regular rhythm and normal heart sounds.   Pulmonary/Chest: Effort normal and breath sounds normal. No respiratory distress. She has no wheezes. She has  no rales. She exhibits no tenderness.  Few rhonchi bilaterally  Abdominal: Soft. Bowel sounds are normal. There is tenderness (+tenderness in left mid abdomen). There is no rebound and no guarding.  Musculoskeletal: Normal range of motion. She exhibits no edema.  Lymphadenopathy:    She has no cervical adenopathy.  Neurological: She is alert and oriented to person, place, and time. GCS eye subscore is 4. GCS verbal subscore is 5. GCS motor subscore is 6.  Skin: Skin is warm and dry. No rash noted.  Psychiatric: She has a normal mood and affect.    ED Course  Procedures (including critical care time) Labs Review Labs Reviewed  COMPREHENSIVE METABOLIC PANEL - Abnormal; Notable for the following:    Glucose, Bld 103 (*)    BUN 26 (*)    Calcium 8.5 (*)    Albumin 3.2 (*)    ALT 9 (*)    All other components within normal limits  URINE CULTURE  URINALYSIS, ROUTINE W REFLEX MICROSCOPIC (NOT AT [facility])  CBC WITH DIFFERENTIAL/PLATELET  LIPASE, BLOOD    Imaging Review Dg Chest 2 View  05/30/2015  CLINICAL DATA:   82 year old female with cough and congestion. EXAM: CHEST  2 VIEW COMPARISON:  04/06/2015 and prior radiographs FINDINGS: Cardiomegaly and mild pulmonary vascular congestion noted. Mild bibasilar atelectasis and peribronchial thickening noted. There may be trace effusions present. There is no evidence of pneumothorax or edema. IMPRESSION: Unchanged appearance of the chest with cardiomegaly and mild pulmonary vascular congestion with possible trace effusions. Electronically Signed   By: Margarette Canada M.D.   On: 05/30/2015 10:52   Ct Renal Stone Study  05/30/2015  CLINICAL DATA:  Left flank pain. EXAM: CT ABDOMEN AND PELVIS WITHOUT CONTRAST TECHNIQUE: Multidetector CT imaging of the abdomen and pelvis was performed following the standard protocol without IV contrast. COMPARISON:  May 30, 2014. FINDINGS: Stable old L2 vertebral body compression fracture is noted. Multilevel degenerative disc disease is noted in the lumbar spine. Visualized lung bases are unremarkable. Status post cholecystectomy. No focal abnormality is noted in the liver, spleen or pancreas on these unenhanced images. Adrenal glands are unremarkable. Atherosclerosis of abdominal aorta is noted without aneurysm formation. Bilateral renal cysts are noted, including hyperdense cyst arising from upper pole of left kidney. No definite renal or ureteral calculi are noted. However, severe right hydronephrosis is noted without evidence of obstructing calculus. No ureteral dilatation is noted, and these findings are consistent with severe ureteropelvic junction stenosis. Sigmoid diverticulosis is noted without inflammation. No definite evidence of bowel obstruction is noted. No abnormal fluid collection is noted. Urinary bladder is unremarkable. No significant adenopathy is noted. Patient is status post hysterectomy. Ovaries are not well visualized. IMPRESSION: Atherosclerosis of abdominal aorta without aneurysm formation. Extensive bilateral renal cysts  are noted, including probable hyperdense cyst arising from upper pole of left kidney. Severe right hydronephrosis is noted without ureteral dilatation or evidence of obstructing calculus. This is most consistent with severe right ureteropelvic junction stenosis. Sigmoid diverticulosis is noted without inflammation. Electronically Signed   By: Marijo Conception, M.D.   On: 05/30/2015 11:24   I have personally reviewed and evaluated these images and lab results as part of my medical decision-making.   EKG Interpretation None      MDM   Final diagnoses:  Hydronephrosis with ureteropelvic junction (UPJ) obstruction    Patient presents with nausea vomiting abdominal pain. She's had no vomiting or diarrhea in the ED. Her pain is improved and she's  not been given any pain medication. She has some mild diffuse tenderness which is slightly more on the right side. Her CT scan shows severe right hydronephrosis with no evidence of ureteral dilatation. This is likely a UPJ obstruction. Her urine is clean without evidence of infection. She's afebrile. Her white count is normal. Her other labs are unremarkable. Her creatinine is normal. I discussed this with the urologist on call, Dr. Noah Delaine who felt the patient can be discharged with follow-up in the office. I discussed this with the patient and her daughter. The daughter will make an appointment for her to follow-up with the urologist. She was advised to return to the ED if she develops any worsening symptoms including ongoing vomiting, fevers or increased confusion. She denies any for any pain medication.    Malvin Johns, MD 05/30/15 1420

## 2015-05-30 NOTE — ED Notes (Signed)
Awake. Verbally responsive. A/O x4. Resp even and unlabored. No audible adventitious breath sounds noted. ABC's intact. IV saline lock patent and intact. ED techs at bedside to obtain urine specimen via in and out cath.

## 2015-05-30 NOTE — ED Notes (Signed)
Awake. Verbally responsive. A/O x4. Resp even and unlabored. No audible adventitious breath sounds noted. ABC's intact. Pt recently dx and treated for UTI. Pt noted having hallucinations, n/v, urinary frequency/urgency, lower abd pain but denies foul odor urine/hematuria/pressure/burning with voiding. Family at bedside. IV saline lock patent and intact.

## 2015-05-30 NOTE — ED Notes (Signed)
Awake. Verbally responsive. A/O x4. Resp even and unlabored. No audible adventitious breath sounds noted. ABC's intact. IV saline lock patent and intact. Family at bedside. 

## 2015-05-30 NOTE — ED Notes (Signed)
Pt presents with family from an assisted living facility with c/o vomiting and some confusion. Per daughter, pt was diagnosed with a UTI on Thursday and has been receiving antibiotics. Pt started vomiting and having episodes of diarrhea yesterday with some abdominal discomfort. Staff also report some confusion and hallucinations since she was diagnosed with the UTI, report that pt has been seeing cats.

## 2015-05-30 NOTE — Discharge Instructions (Signed)
Hydronephrosis  Hydronephrosis is the enlargement of a kidney due to a blockage that stops urine from flowing out of the body.  CAUSES  Common causes of this condition include:  · A birth (congenital) defect of the kidney.  · A congenital defect of the tube through which urine travels (ureter).  · Kidney stones.  · An enlarged prostate gland.  · A tumor.  · Cancer of the prostate, bladder, uterus, ovary, or colon.  · A blood clot.  SYMPTOMS  Symptoms of this condition include:  · Pain or discomfort in your side (flank).  · Swelling of the abdomen.  · Pain in the abdomen.  · Nausea and vomiting.  · Fever.  · Pain while passing urine.  · Feeling of urgency to urinate.  · Frequent urination.  · Infection of the urinary tract.  In some cases, there are no symptoms.  DIAGNOSIS  This condition may be diagnosed with:  · A medical history.  · A physical exam.  · Blood and urine tests to check kidney function.  · Imaging tests, such as an X-ray, ultrasound, CT scan, or MRI.  · A test in which a rigid or flexible telescope (cystoscope) is used to view the site of the blockage.  TREATMENT  Treatment for this condition depends on where the blockage is located, how long it has been there, and what caused it. The goal of treatment is to remove the blockage. Treatment options include:  · A procedure to put in a soft tube to help drain urine.  · Antibiotic medicines to treat or prevent infection.  · Shock-wave therapy (lithotripsy) to help eliminate kidney stones.  HOME CARE INSTRUCTIONS  · Get lots of rest.  · Drink enough fluid to keep your urine clear or pale yellow.  · If you have a drain in, follow your health care provider's instructions about how to care for it.  · Take medicines only as directed by your health care provider.  · If you were prescribed an antibiotic medicine, finish all of it even if you start to feel better.  · Keep all follow-up visits as directed by your health care provider. This is important.  SEEK  MEDICAL CARE IF:  · You continue to have symptoms after treatment.  · You develop new symptoms.  · You have a problem with a drainage device.  · Your urine becomes cloudy or bloody.  · You have a fever.  SEEK IMMEDIATE MEDICAL CARE IF:  · You have severe flank or abdominal pain.  · You develop vomiting and are unable to keep fluids down.     This information is not intended to replace advice given to you by your health care provider. Make sure you discuss any questions you have with your health care provider.     Document Released: 03/12/2007 Document Revised: 09/29/2014 Document Reviewed: 05/11/2014  Elsevier Interactive Patient Education ©2016 [employer]

## 2015-05-30 NOTE — ED Notes (Addendum)
Awake. Verbally responsive. A/O x4. Resp even and unlabored. No audible adventitious breath sounds noted. ABC's intact. SB on monitor. IV saline lock patent and intact. Pt given water to drink and tolerated well. Family at bedside.

## 2015-05-31 LAB — URINE CULTURE: Culture: NO GROWTH

## 2016-12-30 IMAGING — CR DG CHEST 2V
2 series · 2 of 2 positions shown · non-contrast
Comparison: January 19, 2014

CLINICAL DATA: Patient status post fall from [REDACTED] at 5
this afternoon

EXAM:
CHEST  2 VIEW

[w chest lat]
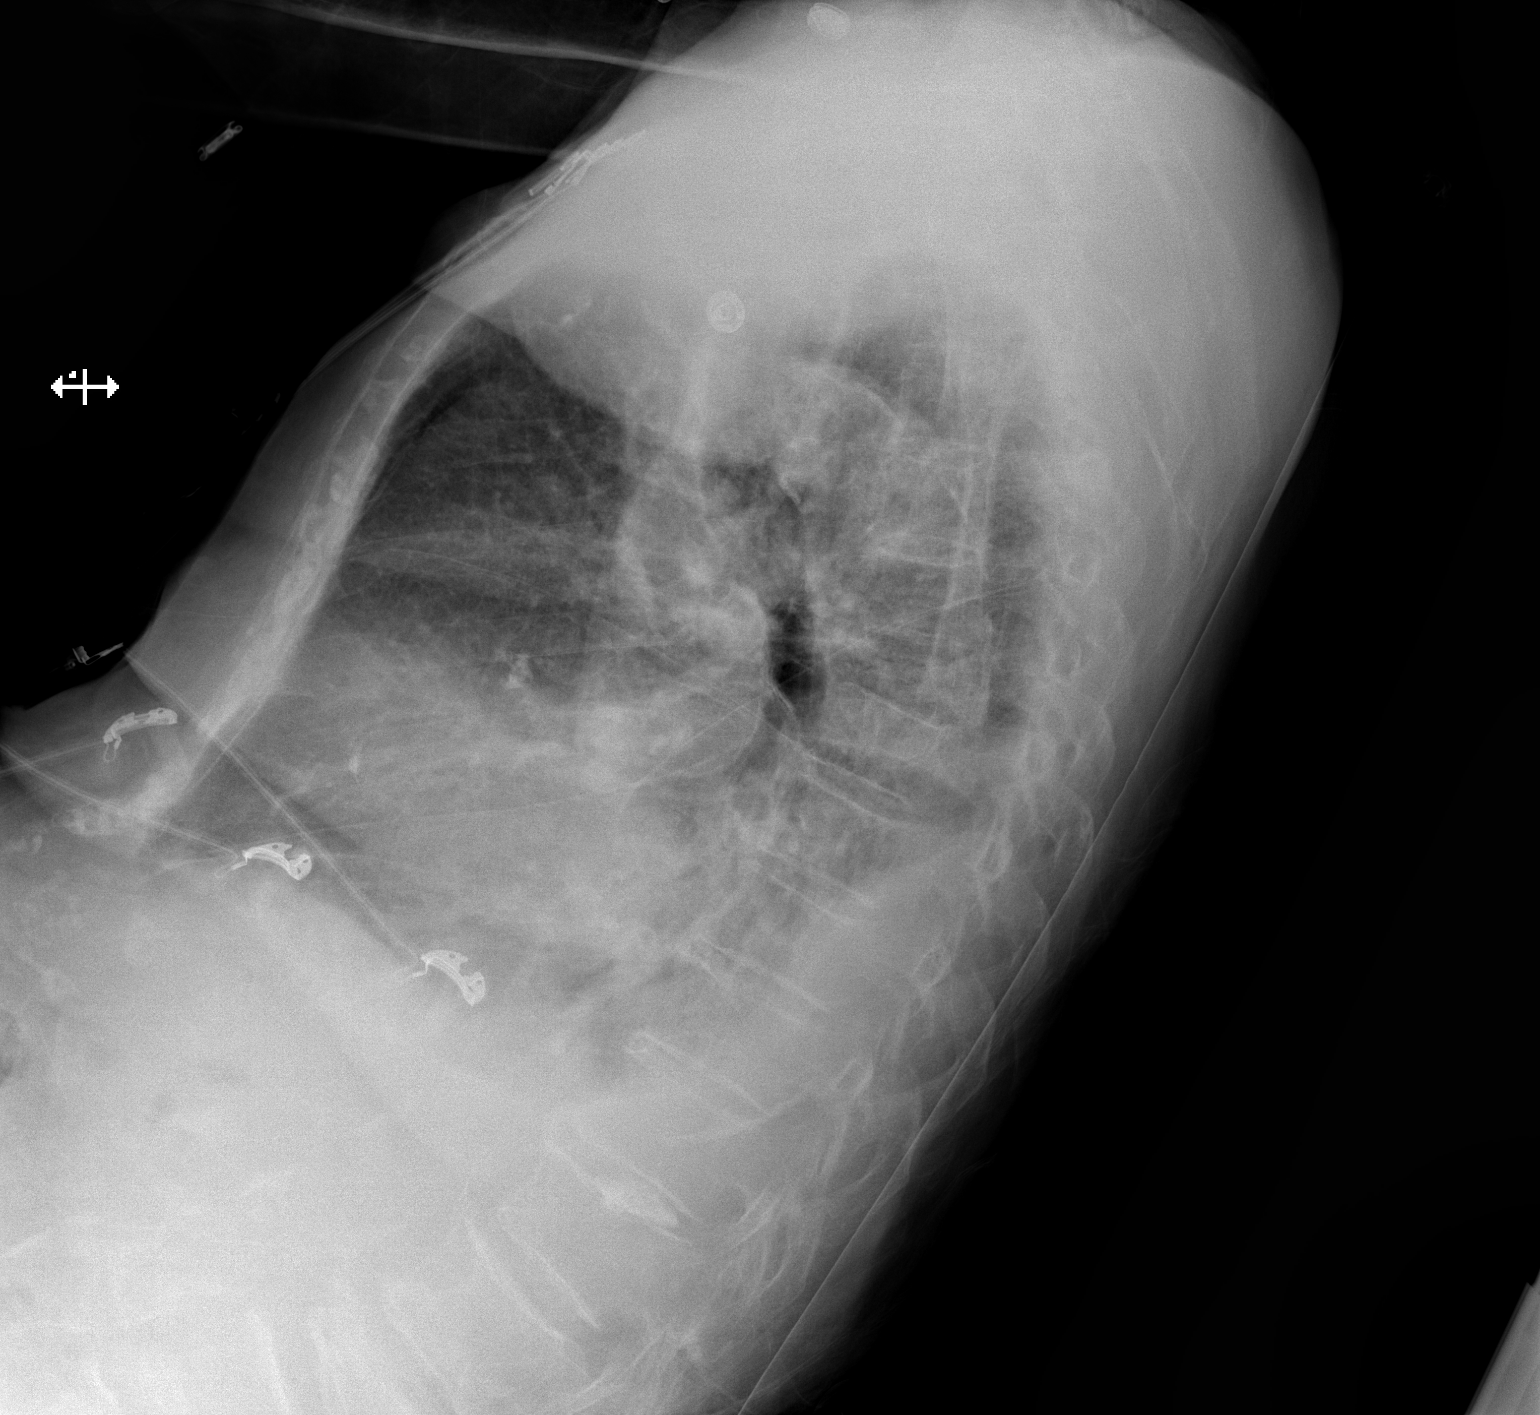

[x chest ap]
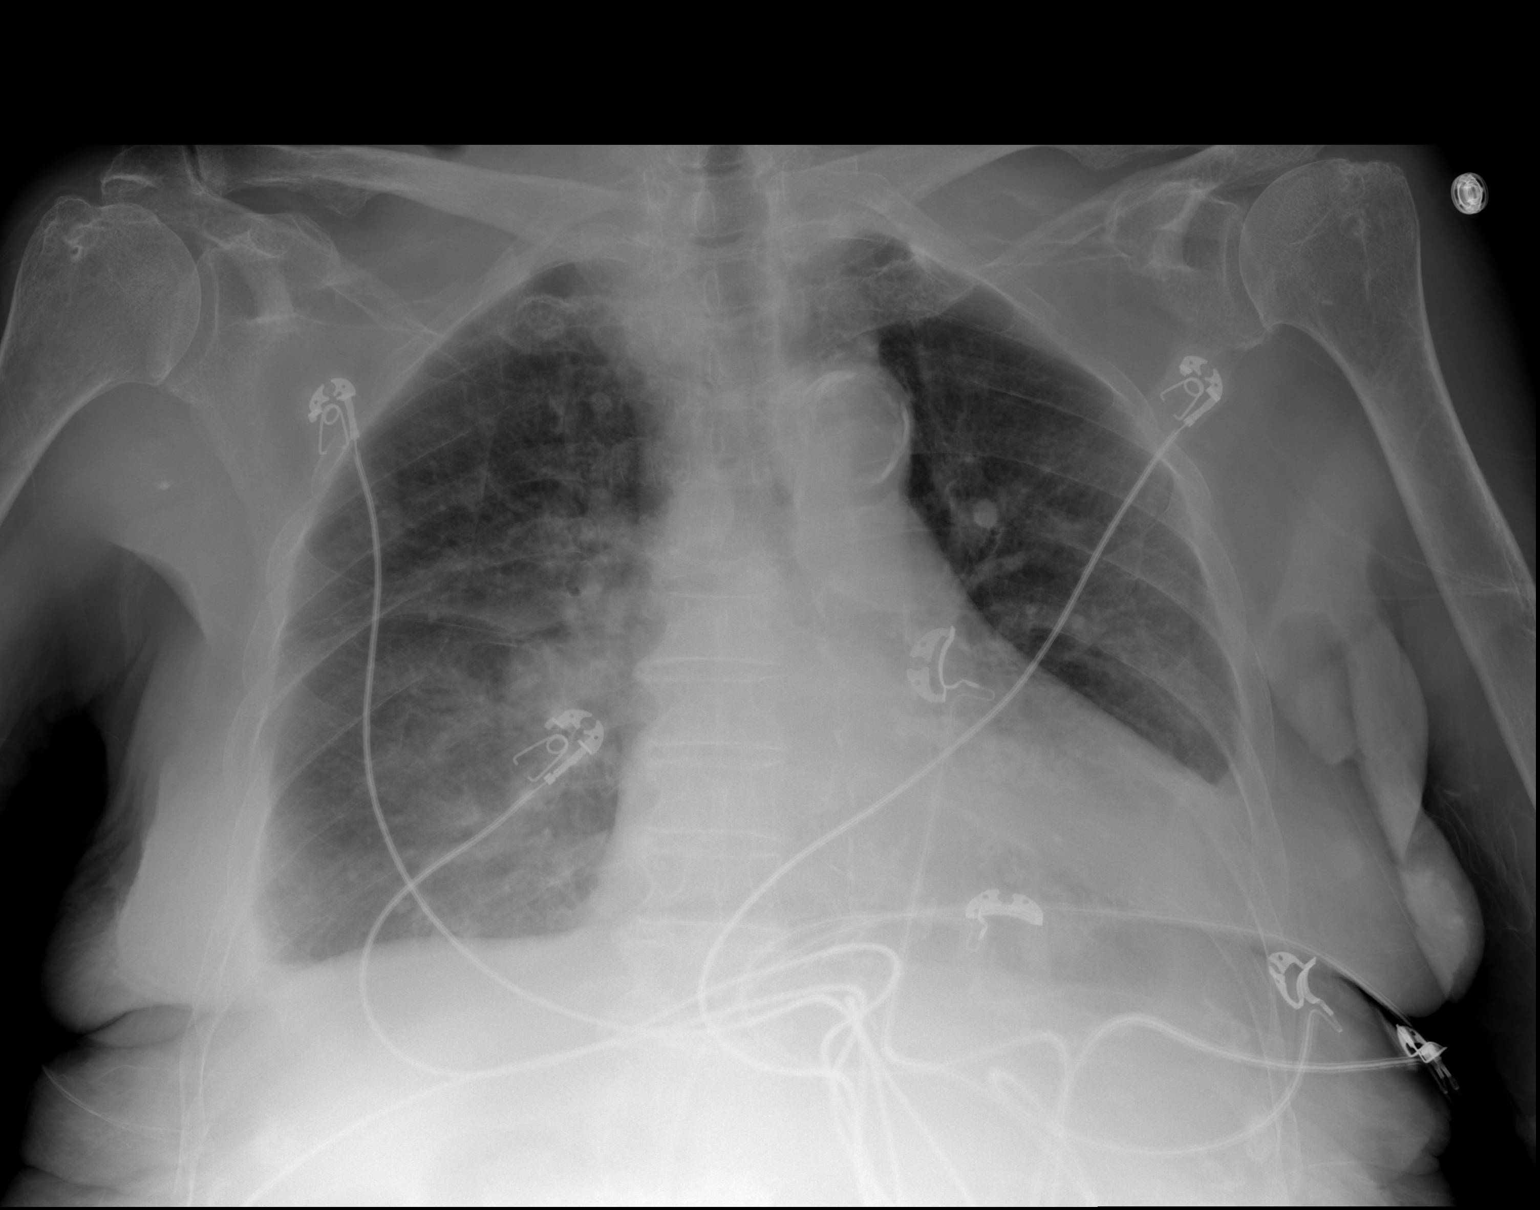

[2 of 2 positions shown; findings below may reference images not displayed]

FINDINGS: The mediastinal contour is normal. The heart size is enlarged. The
aorta is tortuous. There is central pulmonary edema. There are small
bilateral pleural effusions. There is no focal pneumonia.
Degenerative joint changes of bilateral shoulders are noted.
IMPRESSION: Mild congestive heart failure.  Small bilateral pleural effusions.

## 2016-12-30 IMAGING — CR DG LUMBAR SPINE COMPLETE 4+V
6 series · 6 of 6 positions shown · non-contrast
Comparison: None.

CLINICAL DATA: Status post fall and [HOSPITAL] 5 o'clock today.

EXAM:
LUMBAR SPINE - COMPLETE 4+ VIEW

[t lumbar spine ap (1 of 2)]
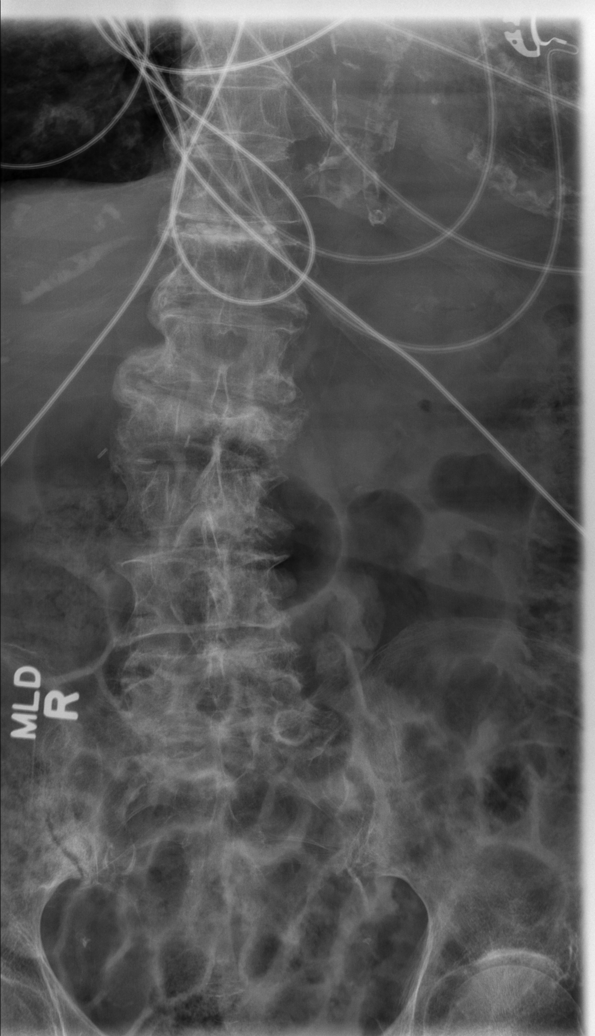

[t lumbar spine ap (2 of 2)]
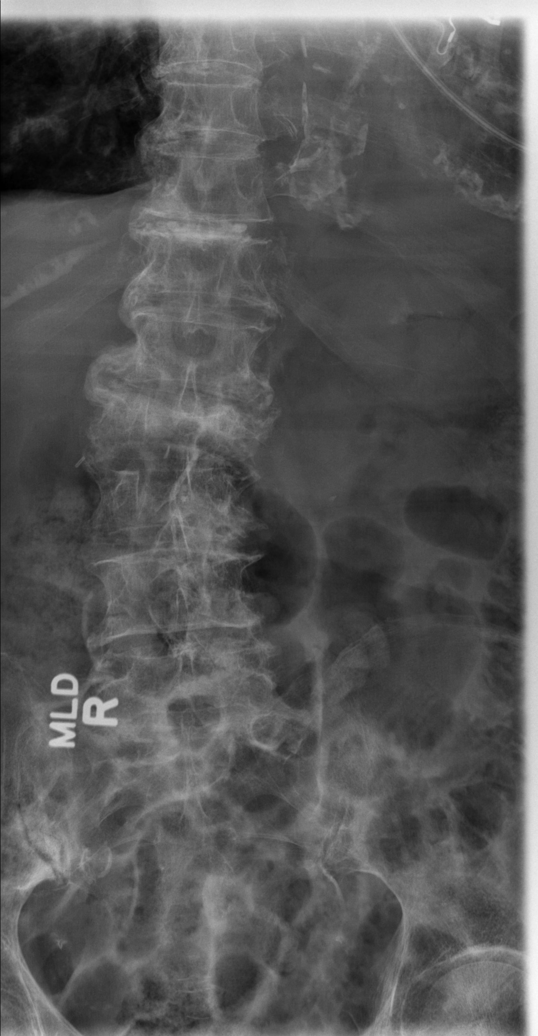

[t lumbar spine obl (1 of 2)]
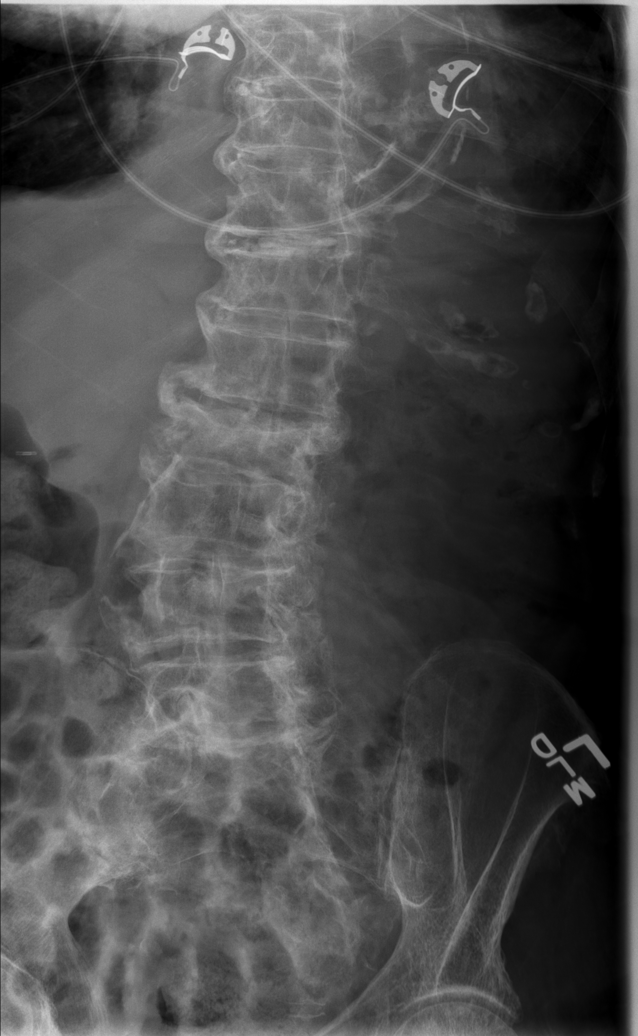

[t lumbar spine obl (2 of 2)]
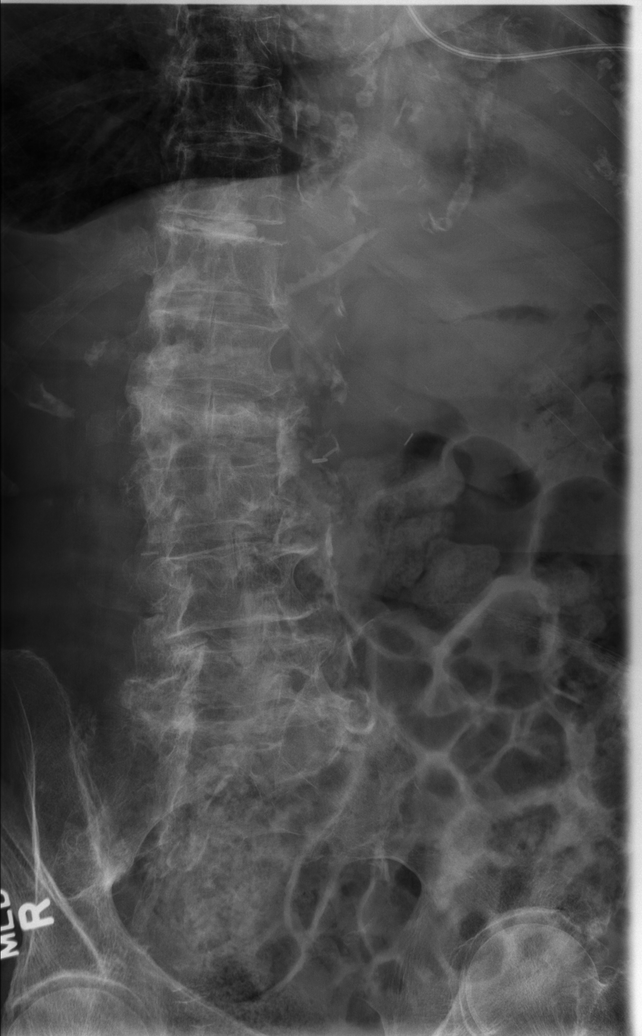

[t lumbar spine lat]
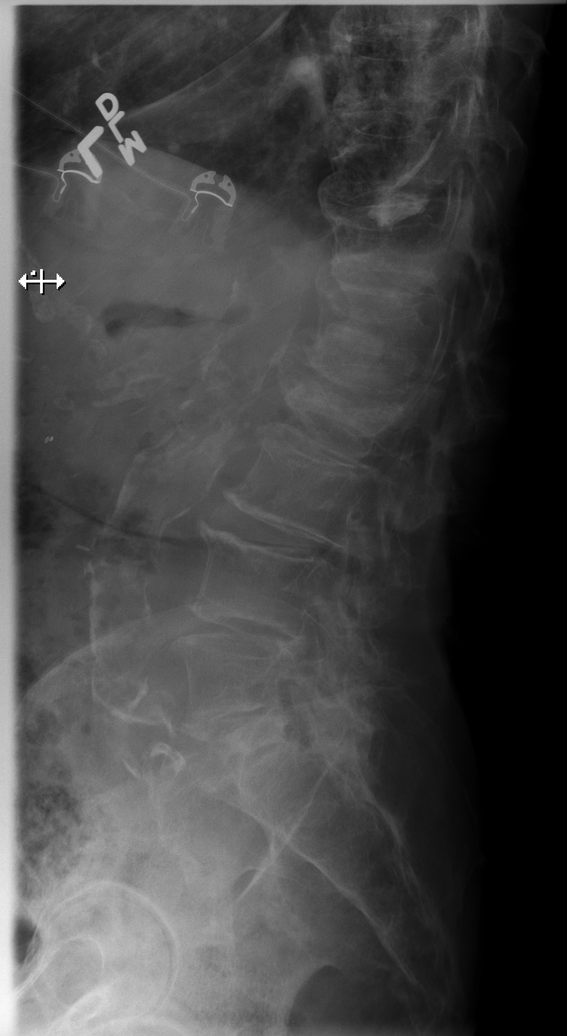

[t lumbar l-5 s-1 spot]
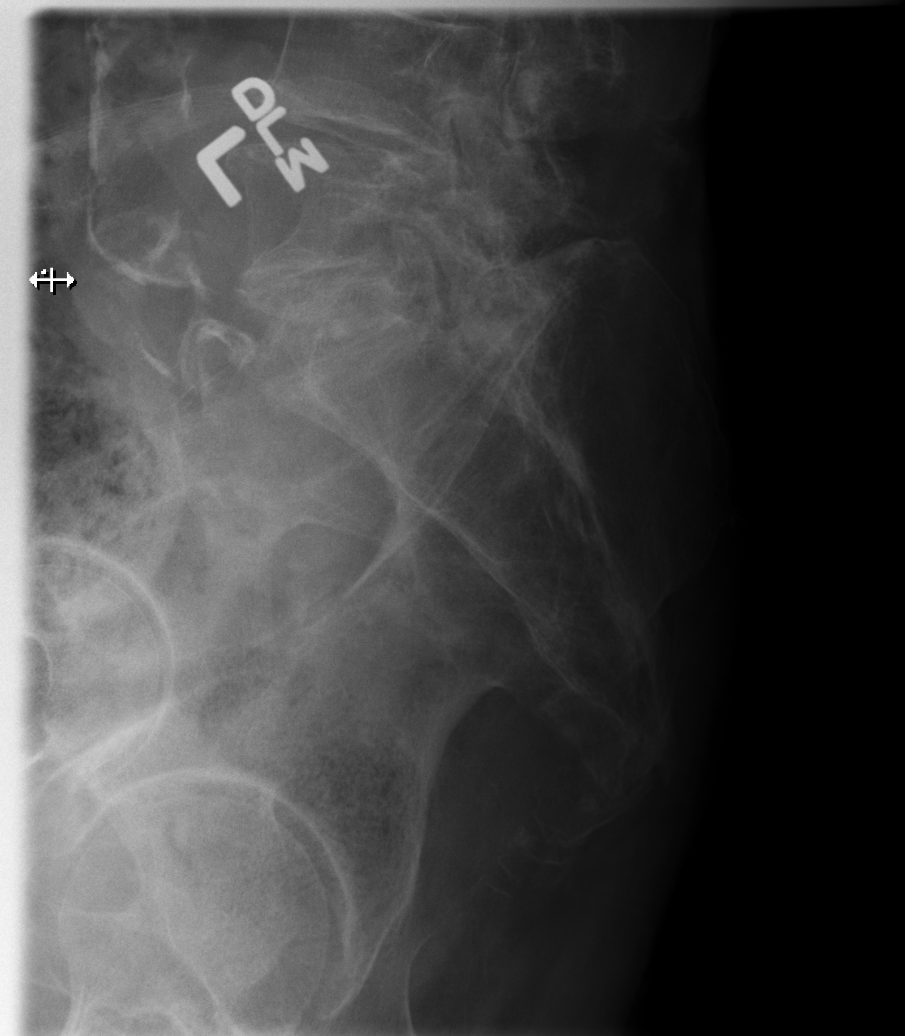

[6 of 6 positions shown; findings below may reference images not displayed]

FINDINGS: There are degenerative joint changes noted throughout lumbar spine.
There is scoliosis of spine. There are compression deformities of
L2, L1, T12, T11, T10, more prominently involving L2. These
compression deformities are probably chronic but evaluation is
limited without old films for comparison.
IMPRESSION: Scoliosis and degenerative joint changes throughout the spine. There
are compression deformities of T10 through L2, probably chronic but
assessment of chronicity is limited without old films for
comparison. If the patient has focal pain at L2, acute component is
not excluded.

## 2017-01-13 ENCOUNTER — Emergency Department (HOSPITAL_COMMUNITY): Payer: Medicare HMO

## 2017-01-13 ENCOUNTER — Encounter (HOSPITAL_COMMUNITY): Payer: Self-pay | Admitting: *Deleted

## 2017-01-13 ENCOUNTER — Inpatient Hospital Stay (HOSPITAL_COMMUNITY)
Admission: EM | Admit: 2017-01-13 | Discharge: 2017-01-15 | DRG: 189 | Disposition: A | Discharge status: Home / Self Care | Payer: Medicare HMO | Attending: Internal Medicine | Admitting: Internal Medicine

## 2017-01-13 ENCOUNTER — Observation Stay (HOSPITAL_COMMUNITY): Payer: Medicare HMO

## 2017-01-13 DIAGNOSIS — I10 Essential (primary) hypertension: Secondary | ICD-10-CM | POA: Diagnosis present

## 2017-01-13 DIAGNOSIS — S0003XA Contusion of scalp, initial encounter: Secondary | ICD-10-CM | POA: Diagnosis present

## 2017-01-13 DIAGNOSIS — R0902 Hypoxemia: Secondary | ICD-10-CM | POA: Diagnosis not present

## 2017-01-13 DIAGNOSIS — R52 Pain, unspecified: Secondary | ICD-10-CM

## 2017-01-13 DIAGNOSIS — M47816 Spondylosis without myelopathy or radiculopathy, lumbar region: Secondary | ICD-10-CM | POA: Diagnosis present

## 2017-01-13 DIAGNOSIS — I482 Chronic atrial fibrillation: Secondary | ICD-10-CM | POA: Diagnosis not present

## 2017-01-13 DIAGNOSIS — E1165 Type 2 diabetes mellitus with hyperglycemia: Secondary | ICD-10-CM | POA: Diagnosis present

## 2017-01-13 DIAGNOSIS — Z880 Allergy status to penicillin: Secondary | ICD-10-CM

## 2017-01-13 DIAGNOSIS — M79604 Pain in right leg: Secondary | ICD-10-CM | POA: Diagnosis not present

## 2017-01-13 DIAGNOSIS — D509 Iron deficiency anemia, unspecified: Secondary | ICD-10-CM | POA: Diagnosis present

## 2017-01-13 DIAGNOSIS — Z7951 Long term (current) use of inhaled steroids: Secondary | ICD-10-CM

## 2017-01-13 DIAGNOSIS — Z66 Do not resuscitate: Secondary | ICD-10-CM | POA: Diagnosis present

## 2017-01-13 DIAGNOSIS — Z7982 Long term (current) use of aspirin: Secondary | ICD-10-CM

## 2017-01-13 DIAGNOSIS — J9601 Acute respiratory failure with hypoxia: Secondary | ICD-10-CM | POA: Diagnosis not present

## 2017-01-13 DIAGNOSIS — S0990XA Unspecified injury of head, initial encounter: Secondary | ICD-10-CM

## 2017-01-13 DIAGNOSIS — M4856XA Collapsed vertebra, not elsewhere classified, lumbar region, initial encounter for fracture: Secondary | ICD-10-CM | POA: Diagnosis present

## 2017-01-13 DIAGNOSIS — I110 Hypertensive heart disease with heart failure: Secondary | ICD-10-CM | POA: Diagnosis present

## 2017-01-13 DIAGNOSIS — Z8673 Personal history of transient ischemic attack (TIA), and cerebral infarction without residual deficits: Secondary | ICD-10-CM

## 2017-01-13 DIAGNOSIS — W19XXXA Unspecified fall, initial encounter: Secondary | ICD-10-CM

## 2017-01-13 DIAGNOSIS — Y92099 Unspecified place in other non-institutional residence as the place of occurrence of the external cause: Secondary | ICD-10-CM

## 2017-01-13 DIAGNOSIS — I480 Paroxysmal atrial fibrillation: Secondary | ICD-10-CM | POA: Diagnosis present

## 2017-01-13 DIAGNOSIS — I714 Abdominal aortic aneurysm, without rupture: Secondary | ICD-10-CM | POA: Diagnosis present

## 2017-01-13 DIAGNOSIS — I5032 Chronic diastolic (congestive) heart failure: Secondary | ICD-10-CM | POA: Diagnosis present

## 2017-01-13 DIAGNOSIS — M4854XA Collapsed vertebra, not elsewhere classified, thoracic region, initial encounter for fracture: Secondary | ICD-10-CM | POA: Diagnosis present

## 2017-01-13 DIAGNOSIS — N390 Urinary tract infection, site not specified: Secondary | ICD-10-CM | POA: Diagnosis present

## 2017-01-13 DIAGNOSIS — I4891 Unspecified atrial fibrillation: Secondary | ICD-10-CM | POA: Diagnosis present

## 2017-01-13 DIAGNOSIS — F32A Depression, unspecified: Secondary | ICD-10-CM | POA: Diagnosis present

## 2017-01-13 DIAGNOSIS — F329 Major depressive disorder, single episode, unspecified: Secondary | ICD-10-CM | POA: Diagnosis present

## 2017-01-13 DIAGNOSIS — M47814 Spondylosis without myelopathy or radiculopathy, thoracic region: Secondary | ICD-10-CM | POA: Diagnosis present

## 2017-01-13 DIAGNOSIS — R410 Disorientation, unspecified: Secondary | ICD-10-CM | POA: Diagnosis present

## 2017-01-13 DIAGNOSIS — W050XXA Fall from non-moving wheelchair, initial encounter: Secondary | ICD-10-CM | POA: Diagnosis present

## 2017-01-13 DIAGNOSIS — Z79899 Other long term (current) drug therapy: Secondary | ICD-10-CM

## 2017-01-13 DIAGNOSIS — E785 Hyperlipidemia, unspecified: Secondary | ICD-10-CM | POA: Diagnosis present

## 2017-01-13 DIAGNOSIS — Z91013 Allergy to seafood: Secondary | ICD-10-CM

## 2017-01-13 DIAGNOSIS — D638 Anemia in other chronic diseases classified elsewhere: Secondary | ICD-10-CM | POA: Diagnosis present

## 2017-01-13 LAB — BRAIN NATRIURETIC PEPTIDE: B Natriuretic Peptide: 384 pg/mL — ABNORMAL HIGH (ref 0.0–100.0)

## 2017-01-13 LAB — BASIC METABOLIC PANEL
Anion gap: 9 (ref 5–15)
BUN: 16 mg/dL (ref 6–20)
CO2: 26 mmol/L (ref 22–32)
Calcium: 8.1 mg/dL — ABNORMAL LOW (ref 8.9–10.3)
Chloride: 108 mmol/L (ref 101–111)
Creatinine, Ser: 0.72 mg/dL (ref 0.44–1.00)
GFR calc Af Amer: >60 mL/min (ref >60)
GFR calc non Af Amer: >60 mL/min (ref >60)
Glucose, Bld: 123 mg/dL — ABNORMAL HIGH (ref 65–99)
Potassium: 4.6 mmol/L (ref 3.5–5.1)
Sodium: 143 mmol/L (ref 135–145)

## 2017-01-13 LAB — CBC WITH DIFFERENTIAL/PLATELET
Basophils Absolute: 0 10*3/uL (ref 0.0–0.1)
Basophils Relative: 0 %
Eosinophils Absolute: 0.1 10*3/uL (ref 0.0–0.7)
Eosinophils Relative: 0 %
HCT: 38.6 % (ref 36.0–46.0)
Hemoglobin: 12.5 g/dL (ref 12.0–15.0)
Lymphocytes Relative: 5 %
Lymphs Abs: 0.8 10*3/uL (ref 0.7–4.0)
MCH: 30.2 pg (ref 26.0–34.0)
MCHC: 32.4 g/dL (ref 30.0–36.0)
MCV: 93.2 fL (ref 78.0–100.0)
Monocytes Absolute: 0.9 10*3/uL (ref 0.1–1.0)
Monocytes Relative: 6 %
Neutro Abs: 14.5 10*3/uL — ABNORMAL HIGH (ref 1.7–7.7)
Neutrophils Relative %: 89 %
Platelets: 187 10*3/uL (ref 150–400)
RBC: 4.14 MIL/uL (ref 3.87–5.11)
RDW: 14.8 % (ref 11.5–15.5)
WBC: 16.3 10*3/uL — ABNORMAL HIGH (ref 4.0–10.5)

## 2017-01-13 LAB — DIGOXIN LEVEL: DIGOXIN LVL: 1.1 ng/mL (ref 0.8–2.0)

## 2017-01-13 MED ORDER — ONDANSETRON HCL 4 MG/2ML IJ SOLN: 4.0000 mg | Freq: Four times a day (QID) | INTRAMUSCULAR | Status: DC | PRN

## 2017-01-13 MED ORDER — ACETAMINOPHEN 650 MG RE SUPP: 650.0000 mg | Freq: Four times a day (QID) | RECTAL | Status: DC | PRN

## 2017-01-13 MED ORDER — PAROXETINE HCL 20 MG PO TABS
10.0000 mg | ORAL_TABLET | Freq: Every day | ORAL | Status: DC
  Administered 2017-01-14 – 2017-01-15 (×2): 10 mg via ORAL
  Filled 2017-01-13 (×2): qty 1

## 2017-01-13 MED ORDER — ALBUTEROL SULFATE (2.5 MG/3ML) 0.083% IN NEBU: 2.5000 mg | INHALATION_SOLUTION | RESPIRATORY_TRACT | Status: DC | PRN

## 2017-01-13 MED ORDER — ONDANSETRON HCL 4 MG PO TABS
4.0000 mg | ORAL_TABLET | Freq: Four times a day (QID) | ORAL | Status: DC | PRN
Start: 2017-01-13 — End: 2017-01-15

## 2017-01-13 MED ORDER — DIGOXIN 125 MCG PO TABS
0.1250 mg | ORAL_TABLET | Freq: Every day | ORAL | Status: DC
  Administered 2017-01-14 – 2017-01-15 (×2): 0.125 mg via ORAL
  Filled 2017-01-13 (×2): qty 1

## 2017-01-13 MED ORDER — FERROUS SULFATE 325 (65 FE) MG PO TABS
325.0000 mg | ORAL_TABLET | Freq: Every day | ORAL | Status: DC
  Administered 2017-01-14 – 2017-01-15 (×2): 325 mg via ORAL
  Filled 2017-01-13 (×2): qty 1

## 2017-01-13 MED ORDER — FUROSEMIDE 40 MG PO TABS
40.0000 mg | ORAL_TABLET | Freq: Every day | ORAL | Status: DC
  Administered 2017-01-14 – 2017-01-15 (×2): 40 mg via ORAL
  Filled 2017-01-13 (×3): qty 1

## 2017-01-13 MED ORDER — MOMETASONE FURO-FORMOTEROL FUM 100-5 MCG/ACT IN AERO
2.0000 | INHALATION_SPRAY | Freq: Two times a day (BID) | RESPIRATORY_TRACT | Status: DC
  Filled 2017-01-13: qty 8.8

## 2017-01-13 MED ORDER — HYDRALAZINE HCL 50 MG PO TABS
50.0000 mg | ORAL_TABLET | Freq: Three times a day (TID) | ORAL | Status: DC
  Administered 2017-01-14 – 2017-01-15 (×4): 50 mg via ORAL
  Filled 2017-01-13 (×5): qty 1

## 2017-01-13 MED ORDER — GABAPENTIN 100 MG PO CAPS
100.0000 mg | ORAL_CAPSULE | Freq: Every day | ORAL | Status: DC
Start: 2017-01-13 — End: 2017-01-15
  Administered 2017-01-14: 100 mg via ORAL
  Filled 2017-01-13 (×2): qty 1

## 2017-01-13 MED ORDER — POTASSIUM CHLORIDE CRYS ER 20 MEQ PO TBCR
20.0000 meq | EXTENDED_RELEASE_TABLET | Freq: Every day | ORAL | Status: DC
  Administered 2017-01-14 – 2017-01-15 (×2): 20 meq via ORAL
  Filled 2017-01-13 (×2): qty 1

## 2017-01-13 MED ORDER — TIOTROPIUM BROMIDE MONOHYDRATE 18 MCG IN CAPS
18.0000 ug | ORAL_CAPSULE | Freq: Every day | RESPIRATORY_TRACT | Status: DC
  Filled 2017-01-13: qty 5

## 2017-01-13 MED ORDER — SODIUM CHLORIDE 0.9% FLUSH: 3.0000 mL | INTRAVENOUS | Status: DC | PRN

## 2017-01-13 MED ORDER — IPRATROPIUM-ALBUTEROL 0.5-2.5 (3) MG/3ML IN SOLN
3.0000 mL | Freq: Two times a day (BID) | RESPIRATORY_TRACT | Status: DC
  Administered 2017-01-14 – 2017-01-15 (×3): 3 mL via RESPIRATORY_TRACT
  Filled 2017-01-13 (×3): qty 3

## 2017-01-13 MED ORDER — SODIUM CHLORIDE 0.9 % IV SOLN: 250.0000 mL | INTRAVENOUS | Status: DC | PRN

## 2017-01-13 MED ORDER — IOPAMIDOL (ISOVUE-370) INJECTION 76%
100.0000 mL | Freq: Once | INTRAVENOUS | Status: AC | PRN
  Administered 2017-01-13: 100 mL via INTRAVENOUS

## 2017-01-13 MED ORDER — IPRATROPIUM BROMIDE 0.06 % NA SOLN
2.0000 | Freq: Every day | NASAL | Status: DC
  Administered 2017-01-14 – 2017-01-15 (×2): 2 via NASAL
  Filled 2017-01-13: qty 15

## 2017-01-13 MED ORDER — FAMOTIDINE 20 MG PO TABS
20.0000 mg | ORAL_TABLET | Freq: Every day | ORAL | Status: DC
Start: 2017-01-13 — End: 2017-01-15
  Administered 2017-01-14: 20 mg via ORAL
  Filled 2017-01-13 (×2): qty 1

## 2017-01-13 MED ORDER — TRAMADOL HCL 50 MG PO TABS
50.0000 mg | ORAL_TABLET | Freq: Four times a day (QID) | ORAL | Status: DC | PRN
  Administered 2017-01-14: 50 mg via ORAL
  Filled 2017-01-13 (×2): qty 1

## 2017-01-13 MED ORDER — HEPARIN SODIUM (PORCINE) 5000 UNIT/ML IJ SOLN
5000.0000 [IU] | Freq: Three times a day (TID) | INTRAMUSCULAR | Status: DC
  Administered 2017-01-13 – 2017-01-15 (×4): 5000 [IU] via SUBCUTANEOUS
  Filled 2017-01-13 (×5): qty 1

## 2017-01-13 MED ORDER — ACETAMINOPHEN 325 MG PO TABS: 650.0000 mg | ORAL_TABLET | Freq: Four times a day (QID) | ORAL | Status: DC | PRN

## 2017-01-13 MED ORDER — IOPAMIDOL (ISOVUE-370) INJECTION 76%
INTRAVENOUS | Status: AC
  Filled 2017-01-13: qty 100

## 2017-01-13 MED ORDER — SODIUM CHLORIDE 0.9% FLUSH
3.0000 mL | Freq: Two times a day (BID) | INTRAVENOUS | Status: DC
  Administered 2017-01-13 – 2017-01-15 (×3): 3 mL via INTRAVENOUS

## 2017-01-13 MED ORDER — FLUTICASONE PROPIONATE 50 MCG/ACT NA SUSP
1.0000 | Freq: Every day | NASAL | Status: DC
  Administered 2017-01-14 – 2017-01-15 (×2): 1 via NASAL
  Filled 2017-01-13: qty 16

## 2017-01-13 MED ORDER — METOPROLOL TARTRATE 25 MG PO TABS
25.0000 mg | ORAL_TABLET | Freq: Two times a day (BID) | ORAL | Status: DC
  Administered 2017-01-14 – 2017-01-15 (×3): 25 mg via ORAL
  Filled 2017-01-13 (×4): qty 1

## 2017-01-13 MED ORDER — ASPIRIN EC 81 MG PO TBEC
81.0000 mg | DELAYED_RELEASE_TABLET | Freq: Every day | ORAL | Status: DC
  Administered 2017-01-14: 81 mg via ORAL
  Filled 2017-01-13 (×2): qty 1

## 2017-01-13 NOTE — Progress Notes (Signed)
Patient arrived from the ED.  X-Ray technician tried to perform Portable X-Rays of Right pelvis and femur. Patient stated " No more X-Rays, you've already done enough X-Rays" RN tried to talk with the patient, but the patient still refused the X-Rays.  PCP was notified.

## 2017-01-13 NOTE — ED Notes (Signed)
Call inpatient nurse at 2005 for report. Number [phone]

## 2017-01-13 NOTE — ED Triage Notes (Addendum)
Per EMS, pt from [facility]. Pt was sitting in wheelchair, states she reached for something and fell out of wheelchair. Pt has contusion to right side of head. Pt is not on blood thinners. Pt denies loss of consciousness. Pt denies pain.

## 2017-01-13 NOTE — H&P (Signed)
[facility] History and Physical  Jean Murphy:[id number] DOB: August 17, 1917 DOA: 01/13/2017   PCP: Antony Blackbird, MD  Specialists: Unknown  Chief Complaint: Fall and found to be hypoxic  HPI: Jean Murphy is a 83 y.o. female who apparently lives in either an assisted living facility or a skilled nursing facility and who has a past medical history of atrial fibrillation not on anticoagulation, history of hypertension, history of for diastolic CHF, history of depression who was in her usual state of health at [facility] when she was sitting on her wheelchair and she was reaching for something and fell out of her wheelchair. There was no loss of consciousness. There was a contusion to the right side of her head. Patient was brought in for further evaluation. Patient appears to be somewhat distracted and mildly confused. Her baseline is not known. She follows commands but unable to provide good history. She denies any chest pain, shortness of breath. She was not able to move her right leg and complained of pain in her right knee and her right thigh area. History was extremely limited. In the emergency department, patient was noted to be hypoxic with saturations in the early 80s on room air. Despite the asking her to take deep breaths this did not improve. She had to be placed on Oxygen by nasal cannula. Apparently patient had been on oxygen previously and was weaned off it.  Home Medications: Prior to Admission medications   Medication Sig Start Date End Date Taking? Authorizing Provider  acetaminophen (TYLENOL) 500 MG tablet Take 500 mg by mouth every 6 (six) hours as needed (For pain. Not to exceed 4 tabs in 24 hours.).    Yes [provider]  acidophilus (RISAQUAD) CAPS capsule Take 1 capsule by mouth 2 (two) times daily. Patient taking differently: Take 1 capsule by mouth daily.  06/09/14  Yes Nita Sells, MD  ADVAIR DISKUS 100-50 MCG/DOSE AEPB Inhale 1 puff into the  lungs 2 (two) times daily. 12/22/16  Yes [provider]  aspirin EC 81 MG tablet Take 81 mg by mouth at bedtime.    Yes [provider]  digoxin (LANOXIN) 0.125 MG tablet Take 0.125 mg by mouth daily.   Yes [provider]  famotidine (PEPCID) 20 MG tablet Take 20 mg by mouth at bedtime.   Yes [provider]  fluticasone (FLONASE) 50 MCG/ACT nasal spray Place 1 spray into both nostrils daily.  04/07/14  Yes [provider]  furosemide (LASIX) 20 MG tablet Take 40 mg by mouth daily.    Yes [provider]  gabapentin (NEURONTIN) 100 MG capsule Take 100 mg by mouth at bedtime. 12/14/16  Yes [provider]  hydrALAZINE (APRESOLINE) 50 MG tablet Take 50 mg by mouth 3 (three) times daily.    Yes [provider]  ipratropium (ATROVENT) 0.06 % nasal spray Place 2 sprays into both nostrils daily. 01/09/17  Yes [provider]  ipratropium-albuterol (DUONEB) 0.5-2.5 (3) MG/3ML SOLN Inhale 3 mLs into the lungs 2 (two) times daily. 12/13/16  Yes [provider]  levocetirizine (XYZAL) 5 MG tablet Take 5 mg by mouth at bedtime. 12/23/16  Yes [provider]  loperamide (IMODIUM) 2 MG capsule Take 2 mg by mouth daily as needed. 02/17/15  Yes [provider]  Melatonin 3 MG TABS Take 3 mg by mouth at bedtime.   Yes [provider]  metoprolol tartrate (LOPRESSOR) 25 MG tablet Take 25 mg by mouth 2 (two)  times daily.   Yes [provider]  Multiple Vitamins-Minerals (PRESERVISION AREDS PO) Take 1 tablet by mouth 2 (two) times daily.   Yes [provider]  ondansetron (ZOFRAN) 4 MG tablet Take 4 mg by mouth every 6 (six) hours as needed for nausea/vomiting. 12/15/16  Yes [provider]  PARoxetine (PAXIL) 10 MG tablet Take 10 mg by mouth daily. 12/21/16  Yes [provider]  potassium chloride SA (K-DUR,KLOR-CON) 20 MEQ tablet Take 20 mEq by mouth daily.   Yes  [provider]  SPIRIVA RESPIMAT 2.5 MCG/ACT AERS Inhale 1 puff into the lungs daily. 01/05/17  Yes [provider]  traMADol (ULTRAM) 50 MG tablet Take 1 tablet (50 mg total) by mouth every 6 (six) hours as needed for moderate pain. 01/16/15  Yes Gary Fleet, PA-C  VENTOLIN HFA 108 (90 Base) MCG/ACT inhaler Inhale 2 puffs into the lungs every 6 (six) hours as needed for shortness of breath. 01/01/17  Yes [provider]  aspirin EC 325 MG tablet Take 1 tablet (325 mg total) by mouth daily after breakfast. Take x 1 month post op to decrease risk of blood clots. Patient not taking: Reported on 01/13/2017 01/16/15   Gary Fleet, PA-C  feeding supplement (BOOST / RESOURCE BREEZE) LIQD Take 1 Container by mouth 3 (three) times daily between meals. Patient not taking: Reported on 01/13/2017 01/19/15   Robbie Lis, MD  ferrous sulfate 325 (65 FE) MG tablet Take 1 tablet (325 mg total) by mouth daily with breakfast. 01/19/15   Robbie Lis, MD  nitrofurantoin, macrocrystal-monohydrate, (MACROBID) 100 MG capsule Take 100 mg by mouth every 12 (twelve) hours. x7 days    [provider]  protein supplement (RESOURCE BENEPROTEIN) POWD Take 1 scoop by mouth 2 (two) times daily.    [provider]    Allergies:  Allergies  Allergen Reactions  . Penicillins Other (See Comments)    Reaction unknown per MAR  . Shellfish Allergy Hives    Past Medical History: Past Medical History:  Diagnosis Date  . AAA (abdominal aortic aneurysm) ([place])   . CHF (congestive heart failure) ([place])   . Compound fracture    left leg  . Compression fracture    x 2   . Diabetes mellitus without complication ([place])   . Eczema   . HTN (hypertension)   . Hyperlipidemia   . Hypertension   . Irregular heart beat   . Pleural effusion   . Stroke [facility])     Past Surgical History:  Procedure Laterality Date  . ABDOMINAL HYSTERECTOMY    . CHOLECYSTECTOMY    . CORNEAL TRANSPLANT     . FEMUR IM NAIL Right 01/16/2015   Procedure: INTRAMEDULLARY (IM) RETROGRADE FEMORAL NAILING;  Surgeon: Dorna Leitz, MD;  Location: [facility] ORS;  Service: Orthopedics;  Laterality: Right;  . KNEE SURGERY     right knee     Social History: Lives in a nursing facility. Other information is not forthcoming at this time.  Family History:  Family History  Problem Relation Age of Onset  . Heart attack Mother 49  . Heart disease Mother      Review of Systems - Unable to do due to confusion  Physical Examination  Vitals:   01/13/17 1300 01/13/17 1557 01/13/17 1803  BP: (!) 148/93 (!) 157/83 (!) 158/99  Pulse: 75 70 64  Resp: 16 (!) 26 (!) 24  Temp: 97.6 F (36.4 C) 98.1 F (36.7 C) 97.8 F (  36.6 C)  TempSrc: Oral Oral Oral  SpO2: (!) 86% 98% 96%    BP (!) 158/99 (BP Location: Left Arm)   Pulse 64   Temp 97.8 F (36.6 C) (Oral)   Resp (!) 24   SpO2 96%   General appearance: alert, cooperative, appears stated age, distracted and no distress Head: Contusion noted right forehead Eyes: conjunctivae/corneas clear. PERRL, EOM's intact. Fundi benign. Throat: lips, mucosa, and tongue normal; teeth and gums normal Neck: no adenopathy, no carotid bruit, no JVD, supple, symmetrical, trachea midline and thyroid not enlarged, symmetric, no tenderness/mass/nodules Resp: Coarse breath sounds bilaterally but no obvious wheezing, rales or rhonchi. Normal effort. Cardio: S1, S2 is irregularly irregular. No S3, S4. No rubs, murmurs, bruit GI: soft, non-tender; bowel sounds normal; no masses,  no organomegaly Extremities: edema Minimal edema bilateral lower extremities. Limited range of motion of the right lower extremity at the hip and knee. Patient yelled out in pain. Pulses: Poorly palpable but present Skin: Skin color, texture, turgor normal. No rashes or lesions Lymph nodes: Cervical, supraclavicular, and axillary nodes normal. Neurologic: Awake, alert. Would not answer orientation questions.  No facial asymmetry. She is able to move her right arm, left arm and left leg without any difficulty.   Labs on Admission: I have personally reviewed following labs and imaging studies  CBC:  Recent Labs Lab 01/13/17 1544  WBC 16.3*  NEUTROABS 14.5*  HGB 12.5  HCT 38.6  MCV 93.2  PLT 093   Basic Metabolic Panel:  Recent Labs Lab 01/13/17 1544  NA 143  K 4.6  CL 108  CO2 26  GLUCOSE 123*  BUN 16  CREATININE 0.72  CALCIUM 8.1*    Radiological Exams on Admission: Dg Chest 2 View  Result Date: 01/13/2017 CLINICAL DATA:  Pain following fall.  Decreased oxygen saturation EXAM: CHEST  2 VIEW COMPARISON:  May 30, 2015 FINDINGS: There is atelectatic change in the left base with small left pleural effusion. Lungs elsewhere clear. There is cardiomegaly with pulmonary vascularity within normal limits. There is aortic atherosclerosis. No adenopathy. No pneumothorax. No bone lesions. IMPRESSION: Left base atelectasis with small left pleural effusion. No edema or consolidation. There is stable cardiac enlargement. There is aortic atherosclerosis. Aortic Atherosclerosis (ICD10-I70.0). Electronically Signed   By: Lowella Grip III M.D.   On: 01/13/2017 15:30   Dg Thoracic Spine 2 View  Result Date: 01/13/2017 CLINICAL DATA:  Fall.  History of multiple compression fractures EXAM: THORACIC SPINE 3 VIEWS COMPARISON:  Chest radiograph May 30, 2015 FINDINGS: Frontal, lateral, and swimmer's views obtained. There is marked compression of the T6 vertebral body with mild retropulsion of bone posteriorly, unchanged from prior study. There is moderately severe anterior wedging at T11 with milder anterior wedging at T12, also stable from prior study. No new fracture evident. No spondylolisthesis. There is osteoarthritic change at multiple levels. Disc space narrowing is most marked at T5-6. There is no erosive change. No paraspinous lesions. There is extensive aortic atherosclerosis. IMPRESSION:  Stable compression fractures at T6, T11, and T12, most severe at T6 with mild retropulsion of bone at this level. No new fracture. No spondylolisthesis. There are areas of osteoarthritic change, most notable at T5-6. There is aortic atherosclerosis. Aortic Atherosclerosis (ICD10-I70.0). Electronically Signed   By: Lowella Grip III M.D.   On: 01/13/2017 14:13   Dg Lumbar Spine Complete  Result Date: 01/13/2017 CLINICAL DATA:  Recent fall from wheelchair at a nursing home, back pain, chronic thoracic and lumbar compression  fractures EXAM: LUMBAR SPINE - COMPLETE 4+ VIEW COMPARISON:  05/30/2015 CT reconstructions FINDINGS: Advanced lower thoracic and lumbar degenerative spondylosis at all levels with marked disc space narrowing, sclerosis and endplate osteophytes. Vacuum disc phenomena at L4-5 on the lateral view. Similar chronic compression fractures at T11, T12, and L2. No definite new compression fracture by plain radiography. Lower lumbar facet arthropathy at L4-5 and L5-S1. Extensive atherosclerosis of the aorta. Bones are osteopenic. IMPRESSION: Advanced lower thoracic and lumbar degenerative spondylosis at all levels and lower lumbar facet arthropathy. Chronic compression fractures at T11, T12, and L1 appearing grossly similar. No significant interval change or new finding by plain radiography Aortoiliac atherosclerosis Electronically Signed   By: Jerilynn Mages.  Shick M.D.   On: 01/13/2017 14:15   Ct Head Wo Contrast  Result Date: 01/13/2017 CLINICAL DATA:  Pt was sitting in wheelchair, states she reached for something and fell out of wheelchair. Pt has contusion to right side of head. Pt is not on blood thinners. Pt denies loss of consciousness. Pt denies pain. EXAM: CT HEAD WITHOUT CONTRAST CT CERVICAL SPINE WITHOUT CONTRAST TECHNIQUE: Multidetector CT imaging of the head and cervical spine was performed following the standard protocol without intravenous contrast. Multiplanar CT image reconstructions of the  cervical spine were also generated. FINDINGS: CT HEAD FINDINGS Brain: No evidence of acute infarction, hemorrhage, hydrocephalus, extra-axial collection or mass lesion/mass effect. There is age uppercase ventricular and sulcal enlargement. Mild periventricular white matter hypoattenuation is also noted consistent with chronic microvascular ischemic change. Vascular: No hyperdense vessel or unexpected calcification. Skull: Normal. Negative for fracture or focal lesion. Sinuses/Orbits: Globes and orbits are unremarkable. Sinuses and mastoid air cells are clear. Other: Small right frontal scalp hematoma. CT CERVICAL SPINE FINDINGS Alignment: Normal. Skull base and vertebrae: No acute fracture. No primary bone lesion or focal pathologic process. Soft tissues and spinal canal: No prevertebral fluid or swelling. No visible canal hematoma. No soft tissue masses. There are subcentimeter thyroid nodules. Carotid artery vascular calcifications are noted. Disc levels: Moderate loss of disc height with endplate spurring at P5-K9 through C6-C7. Minor loss of disc height with endplate spurring at T2-I7. Facet degenerative changes are noted bilaterally, greatest on the right at C2-C3 and on the left at C3-C4. No convincing disc herniation. Upper chest: Lung apices are clear. Other: None. IMPRESSION: HEAD CT: No acute intracranial abnormalities. Small right lateral frontal scalp hematoma. No fracture. CERVICAL CT:  No fracture or acute finding. Electronically Signed   By: Lajean Manes M.D.   On: 01/13/2017 13:55   Ct Angio Chest Pe W And/or Wo Contrast  Result Date: 01/13/2017 CLINICAL DATA:  Hypoxia after fall. EXAM: CT ANGIOGRAPHY CHEST WITH CONTRAST TECHNIQUE: Multidetector CT imaging of the chest was performed using the standard protocol during bolus administration of intravenous contrast. Multiplanar CT image reconstructions and MIPs were obtained to evaluate the vascular anatomy. CONTRAST:  100 mL of Isovue 370  COMPARISON:  None. FINDINGS: Cardiovascular: Atherosclerotic changes seen in the non aneurysmal thoracic aorta. Evaluation for dissection is markedly limited due to lack of enhancement with contrast. Cardiomegaly is identified. Coronary artery calcifications are noted. Linear low attenuation is seen in the left main pulmonary artery, extending into the upper and lower lobe branches proximally. This has an appearance of mixing rather than embolus. Evaluation of the lower lobe pulmonary arterial branches is limited due to respiratory motion. Within this limitation, no pulmonary emboli are identified. Mediastinum/Nodes: No enlarged mediastinal, hilar, or axillary lymph nodes. Thyroid gland, trachea, and esophagus  demonstrate no significant findings. Lungs/Pleura: Central airways are normal. There is atelectasis in the left base. No suspicious infiltrates. A few calcified nodules are consistent previous granulomatous disease. Scattered atelectasis is seen. No suspicious nodules, masses, or evidence of pneumonia. Upper Abdomen: No acute abnormality. Musculoskeletal: Compression fractures of multiple levels appear stable since January 2017. Review of the MIP images confirms the above findings. IMPRESSION: 1. No pulmonary emboli identified as above.  Cardiomegaly. 2. Atherosclerosis in the thoracic aorta. Coronary artery calcifications. Aortic Atherosclerosis (ICD10-I70.0). Electronically Signed   By: Dorise Bullion III M.D   On: 01/13/2017 17:56   Ct Cervical Spine Wo Contrast  Result Date: 01/13/2017 CLINICAL DATA:  Pt was sitting in wheelchair, states she reached for something and fell out of wheelchair. Pt has contusion to right side of head. Pt is not on blood thinners. Pt denies loss of consciousness. Pt denies pain. EXAM: CT HEAD WITHOUT CONTRAST CT CERVICAL SPINE WITHOUT CONTRAST TECHNIQUE: Multidetector CT imaging of the head and cervical spine was performed following the standard protocol without intravenous  contrast. Multiplanar CT image reconstructions of the cervical spine were also generated. FINDINGS: CT HEAD FINDINGS Brain: No evidence of acute infarction, hemorrhage, hydrocephalus, extra-axial collection or mass lesion/mass effect. There is age uppercase ventricular and sulcal enlargement. Mild periventricular white matter hypoattenuation is also noted consistent with chronic microvascular ischemic change. Vascular: No hyperdense vessel or unexpected calcification. Skull: Normal. Negative for fracture or focal lesion. Sinuses/Orbits: Globes and orbits are unremarkable. Sinuses and mastoid air cells are clear. Other: Small right frontal scalp hematoma. CT CERVICAL SPINE FINDINGS Alignment: Normal. Skull base and vertebrae: No acute fracture. No primary bone lesion or focal pathologic process. Soft tissues and spinal canal: No prevertebral fluid or swelling. No visible canal hematoma. No soft tissue masses. There are subcentimeter thyroid nodules. Carotid artery vascular calcifications are noted. Disc levels: Moderate loss of disc height with endplate spurring at E9-B2 through C6-C7. Minor loss of disc height with endplate spurring at W4-X3. Facet degenerative changes are noted bilaterally, greatest on the right at C2-C3 and on the left at C3-C4. No convincing disc herniation. Upper chest: Lung apices are clear. Other: None. IMPRESSION: HEAD CT: No acute intracranial abnormalities. Small right lateral frontal scalp hematoma. No fracture. CERVICAL CT:  No fracture or acute finding. Electronically Signed   By: Lajean Manes M.D.   On: 01/13/2017 13:55    My interpretation of Electrocardiogram: Atrial fibrillation, normal rate. No concerning ST or T-wave changes.   Problem List  Principal Problem:   Hypoxia Active Problems:   Atrial fibrillation ([place])   Fall   Essential hypertension   Depression   Anemia of chronic disease   Assessment: This is a 83 year old Caucasian female, lives in a nursing  facility who comes in after a mechanical fall which did not result in any obvious injuries. However, she was noted to be hypoxic in the emergency department. Reason for hypoxia is entirely clear. Apparently she used to be on home oxygen previously and was taken off of it. Unclear how long ago this was. She underwent CT angiogram of the chest was not show any PE. There is no obvious CHF. No pneumonias. Unknown if the patient has a history of COPD. No history of sleep apnea. Complains of right lower extremity pain.  Plan: #1 Hypoxia: Etiology unclear. Unfortunately, emergency department and nursing facility not able to arrange oxygen at this time. She will need to be observed in the hospital. Will consult case management to  assist in obtaining oxygen in the morning. She will need room air saturations documented. She is noted to be on inhaled steroids and Spiriva and nebulizer treatment. She could have underlying COPD. She does not appear to be in any distress at this time.  #2 Fall with contusion to the right head/right LE pain: Does not have any neurological deficits. Denies any headaches. CT head did not show any acute findings. She also underwent thoracic and lumbar spine films which also did not show any acute findings. Compression fractures were noted, which were thought to be chronic. She denies any back pain. She does complain of pain in her right lower extremity, specifically in the knee and in the right thigh area. We will get x-rays of her hip, femur and right knee. PT and OT evaluation.  #3 history of atrial fibrillation: Not an anticoagulation due to history of falls. Continue with her metoprolol, digoxin. We'll check a dig Level.  #4 history of diastolic CHF: Appears to be reasonably compensated. Continue with her home medications.  #5 Essential hypertension: Continue with her home medication.  #6 history of depression: Continue with her home medications.  #7 Leukocytosis: Could be reactive  due to acute stress. Recheck in the morning. No obvious infections identified. UA is pending. No antibiotics at this time.  #8 Hyperglycemia: This is a nonfasting level. Recheck in the morning  DVT Prophylaxis: Heparin subcutaneously Code Status: DO NOT RESUSCITATE based on nursing facility paperwork Family Communication: No family at bedside  Consults called: None   Severity of Illness: The appropriate patient status for this patient is OBSERVATION. Observation status is judged to be reasonable and necessary in order to provide the required intensity of service to ensure the patient's safety. The patient's presenting symptoms, physical exam findings, and initial radiographic and laboratory data in the context of their medical condition is felt to place them at decreased risk for further clinical deterioration. Furthermore, it is anticipated that the patient will be medically stable for discharge from the hospital within 2 midnights of admission. The following factors support the patient status of observation.   " The patient's presenting symptoms include fall. " The physical exam findings include hypoxia. " The initial radiographic and laboratory data are compression fracture   Further management decisions will depend on results of further testing and patient's response to treatment.   [facility]  [facility] Pager [phone]  If 7PM-7AM, please contact night-coverage [email or web address] Password [facility]  01/13/2017, 6:57 PM

## 2017-01-13 NOTE — ED Provider Notes (Signed)
[facility] DEPT Provider Note   CSN: [id number] Arrival date & time: 01/13/17  1242     History   Chief Complaint Chief Complaint  Patient presents with  . Fall    HPI Jean Murphy is a 83 y.o. female with history of CHF, diabetes, hypertension, AAA who presents from nursing facility, [facility], after fall. Patient reports she was sitting in a wheelchair and reached onto the floor to get something and toppled over. She hit her head but did not lose consciousness. She has a hematoma on her right forehead that is only mildly painful. She denies any new pain elsewhere. Patient reports old injuries of compression fractures and right knee pain, but these have not changed since the most recent fall today. Patient denies any chest pain, new shortness of breath, abdominal pain, nausea, vomiting, urinary symptoms. Patient is currently being treated for urinary tract infection per patient's daughter.  HPI  Past Medical History:  Diagnosis Date  . AAA (abdominal aortic aneurysm) ([place])   . CHF (congestive heart failure) ([place])   . Compound fracture    left leg  . Compression fracture    x 2   . Diabetes mellitus without complication ([place])   . Eczema   . HTN (hypertension)   . Hyperlipidemia   . Hypertension   . Irregular heart beat   . Pleural effusion   . Stroke [facility])     Patient Active Problem List   Diagnosis Date Noted  . Hypoxia 01/13/2017  . Postoperative anemia due to acute blood loss 01/17/2015  . Hip fracture, right ([place]) 01/16/2015  . Essential hypertension 01/16/2015  . Depression 01/16/2015  . Hypokalemia 01/16/2015  . Anemia of chronic disease 01/16/2015  . Leukocytosis 01/16/2015  . Fall 05/30/2014  . Atrial fibrillation ([place]) 01/30/2014    Past Surgical History:  Procedure Laterality Date  . ABDOMINAL HYSTERECTOMY    . CHOLECYSTECTOMY    . CORNEAL TRANSPLANT    . FEMUR IM NAIL Right 01/16/2015   Procedure: INTRAMEDULLARY (IM) RETROGRADE FEMORAL  NAILING;  Surgeon: Dorna Leitz, MD;  Location: [facility] ORS;  Service: Orthopedics;  Laterality: Right;  . KNEE SURGERY     right knee     OB History    Gravida Para Term Preterm AB Living   0 0 0 0 0     SAB TAB Ectopic Multiple Live Births   0 0 0           Home Medications    Prior to Admission medications   Medication Sig Start Date End Date Taking? Authorizing Provider  acetaminophen (TYLENOL) 500 MG tablet Take 500 mg by mouth every 6 (six) hours as needed (For pain. Not to exceed 4 tabs in 24 hours.).    Yes [provider]  acidophilus (RISAQUAD) CAPS capsule Take 1 capsule by mouth 2 (two) times daily. Patient taking differently: Take 1 capsule by mouth daily.  06/09/14  Yes Nita Sells, MD  ADVAIR DISKUS 100-50 MCG/DOSE AEPB Inhale 1 puff into the lungs 2 (two) times daily. 12/22/16  Yes [provider]  aspirin EC 81 MG tablet Take 81 mg by mouth at bedtime.    Yes [provider]  digoxin (LANOXIN) 0.125 MG tablet Take 0.125 mg by mouth daily.   Yes [provider]  famotidine (PEPCID) 20 MG tablet Take 20 mg by mouth at bedtime.   Yes [provider]  fluticasone (FLONASE) 50 MCG/ACT nasal spray Place 1 spray into both nostrils daily.  04/07/14  Yes [provider]  furosemide (LASIX) 20 MG tablet Take 40 mg by mouth daily.    Yes [provider]  gabapentin (NEURONTIN) 100 MG capsule Take 100 mg by mouth at bedtime. 12/14/16  Yes [provider]  hydrALAZINE (APRESOLINE) 50 MG tablet Take 50 mg by mouth 3 (three) times daily.    Yes [provider]  ipratropium (ATROVENT) 0.06 % nasal spray Place 2 sprays into both nostrils daily. 01/09/17  Yes [provider]  ipratropium-albuterol (DUONEB) 0.5-2.5 (3) MG/3ML SOLN Inhale 3 mLs into the lungs 2 (two) times daily. 12/13/16  Yes [provider]  levocetirizine (XYZAL) 5 MG tablet Take 5 mg by mouth at bedtime. 12/23/16  Yes  [provider]  loperamide (IMODIUM) 2 MG capsule Take 2 mg by mouth daily as needed. 02/17/15  Yes [provider]  Melatonin 3 MG TABS Take 3 mg by mouth at bedtime.   Yes [provider]  metoprolol tartrate (LOPRESSOR) 25 MG tablet Take 25 mg by mouth 2 (two) times daily.   Yes [provider]  Multiple Vitamins-Minerals (PRESERVISION AREDS PO) Take 1 tablet by mouth 2 (two) times daily.   Yes [provider]  ondansetron (ZOFRAN) 4 MG tablet Take 4 mg by mouth every 6 (six) hours as needed for nausea/vomiting. 12/15/16  Yes [provider]  PARoxetine (PAXIL) 10 MG tablet Take 10 mg by mouth daily. 12/21/16  Yes [provider]  potassium chloride SA (K-DUR,KLOR-CON) 20 MEQ tablet Take 20 mEq by mouth daily.   Yes [provider]  SPIRIVA RESPIMAT 2.5 MCG/ACT AERS Inhale 1 puff into the lungs daily. 01/05/17  Yes [provider]  traMADol (ULTRAM) 50 MG tablet Take 1 tablet (50 mg total) by mouth every 6 (six) hours as needed for moderate pain. 01/16/15  Yes Gary Fleet, PA-C  VENTOLIN HFA 108 (90 Base) MCG/ACT inhaler Inhale 2 puffs into the lungs every 6 (six) hours as needed for shortness of breath. 01/01/17  Yes [provider]  aspirin EC 325 MG tablet Take 1 tablet (325 mg total) by mouth daily after breakfast. Take x 1 month post op to decrease risk of blood clots. Patient not taking: Reported on 01/13/2017 01/16/15   Gary Fleet, PA-C  feeding supplement (BOOST / RESOURCE BREEZE) LIQD Take 1 Container by mouth 3 (three) times daily between meals. Patient not taking: Reported on 01/13/2017 01/19/15   Robbie Lis, MD  ferrous sulfate 325 (65 FE) MG tablet Take 1 tablet (325 mg total) by mouth daily with breakfast. 01/19/15   Robbie Lis, MD  nitrofurantoin, macrocrystal-monohydrate, (MACROBID) 100 MG capsule Take 100 mg by mouth every 12 (twelve) hours. x7 days    [provider]  protein  supplement (RESOURCE BENEPROTEIN) POWD Take 1 scoop by mouth 2 (two) times daily.    [provider]    Family History Family History  Problem Relation Age of Onset  . Heart attack Mother 19  . Heart disease Mother     Social History Social History  Substance Use Topics  . Smoking status: Never Smoker  . Smokeless tobacco: Never Used  . Alcohol use No     Allergies   Penicillins and Shellfish allergy   Review of Systems Review of Systems  Constitutional: Negative for chills and fever.  HENT: Negative for facial swelling and sore throat.   Respiratory: Positive for shortness of breath (baseline).   Cardiovascular: Negative for chest pain.  Gastrointestinal: Negative for abdominal pain, nausea and vomiting.  Genitourinary: Negative for dysuria.  Musculoskeletal: Positive for back pain (baseline).  Skin: Negative for rash and wound.  Neurological: Positive for headaches (mild over hematoma only).  Psychiatric/Behavioral: The patient is not nervous/anxious.      Physical Exam Updated Vital Signs BP (!) 158/99 (BP Location: Left Arm)   Pulse 64   Temp 97.8 F (36.6 C) (Oral)   Resp (!) 24   SpO2 96%   Physical Exam  Constitutional: She appears well-developed and well-nourished. No distress.  HENT:  Head: Normocephalic and atraumatic.    Mouth/Throat: Oropharynx is clear and moist. No oropharyngeal exudate.  Eyes: Pupils are equal, round, and reactive to light. Conjunctivae are normal. Right eye exhibits no discharge. Left eye exhibits no discharge. No scleral icterus.  Neck: Normal range of motion. Neck supple. No thyromegaly present.  Cardiovascular: Normal rate, regular rhythm, normal heart sounds and intact distal pulses.  Exam reveals no gallop and no friction rub.   No murmur heard. Pulmonary/Chest: Effort normal and breath sounds normal. No stridor. No respiratory distress. She has no wheezes. She has no rales.  Abdominal: Soft. Bowel sounds are  normal. She exhibits no distension. There is no tenderness. There is no rebound and no guarding.  Musculoskeletal: She exhibits no edema.  No midline cervical tenderness; midline thoracic and lumbar tenderness, however patient states this is old Right knee tenderness, however patient states this is old as well  Lymphadenopathy:    She has no cervical adenopathy.  Neurological: She is alert. Coordination normal.  5/5 strength to all 4 extremity, normal sensation throughout; equal bilateral grip strength  Skin: Skin is warm and dry. No rash noted. She is not diaphoretic. No pallor.  Psychiatric: She has a normal mood and affect.  Nursing note and vitals reviewed.    ED Treatments / Results  Labs (all labs ordered are listed, but only abnormal results are displayed) Labs Reviewed  BASIC METABOLIC PANEL - Abnormal; Notable for the following:       Result Value   Glucose, Bld 123 (*)    Calcium 8.1 (*)    All other components within normal limits  CBC WITH DIFFERENTIAL/PLATELET - Abnormal; Notable for the following:    WBC 16.3 (*)    Neutro Abs 14.5 (*)    All other components within normal limits  BRAIN NATRIURETIC PEPTIDE - Abnormal; Notable for the following:    B Natriuretic Peptide 384.0 (*)    All other components within normal limits  URINALYSIS, ROUTINE W REFLEX MICROSCOPIC    EKG  EKG Interpretation None       Radiology Dg Chest 2 View  Result Date: 01/13/2017 CLINICAL DATA:  Pain following fall.  Decreased oxygen saturation EXAM: CHEST  2 VIEW COMPARISON:  May 30, 2015 FINDINGS: There is atelectatic change in the left base with small left pleural effusion. Lungs elsewhere clear. There is cardiomegaly with pulmonary vascularity within normal limits. There is aortic atherosclerosis. No adenopathy. No pneumothorax. No bone lesions. IMPRESSION: Left base atelectasis with small left pleural effusion. No edema or consolidation. There is stable cardiac enlargement. There  is aortic atherosclerosis. Aortic Atherosclerosis (ICD10-I70.0). Electronically Signed   By: Lowella Grip III M.D.   On: 01/13/2017 15:30   Dg Thoracic Spine 2 View  Result Date: 01/13/2017 CLINICAL DATA:  Fall.  History of multiple compression fractures EXAM: THORACIC SPINE 3 VIEWS COMPARISON:  Chest radiograph May 30, 2015 FINDINGS: Frontal, lateral, and swimmer's  views obtained. There is marked compression of the T6 vertebral body with mild retropulsion of bone posteriorly, unchanged from prior study. There is moderately severe anterior wedging at T11 with milder anterior wedging at T12, also stable from prior study. No new fracture evident. No spondylolisthesis. There is osteoarthritic change at multiple levels. Disc space narrowing is most marked at T5-6. There is no erosive change. No paraspinous lesions. There is extensive aortic atherosclerosis. IMPRESSION: Stable compression fractures at T6, T11, and T12, most severe at T6 with mild retropulsion of bone at this level. No new fracture. No spondylolisthesis. There are areas of osteoarthritic change, most notable at T5-6. There is aortic atherosclerosis. Aortic Atherosclerosis (ICD10-I70.0). Electronically Signed   By: Lowella Grip III M.D.   On: 01/13/2017 14:13   Dg Lumbar Spine Complete  Result Date: 01/13/2017 CLINICAL DATA:  Recent fall from wheelchair at a nursing home, back pain, chronic thoracic and lumbar compression fractures EXAM: LUMBAR SPINE - COMPLETE 4+ VIEW COMPARISON:  05/30/2015 CT reconstructions FINDINGS: Advanced lower thoracic and lumbar degenerative spondylosis at all levels with marked disc space narrowing, sclerosis and endplate osteophytes. Vacuum disc phenomena at L4-5 on the lateral view. Similar chronic compression fractures at T11, T12, and L2. No definite new compression fracture by plain radiography. Lower lumbar facet arthropathy at L4-5 and L5-S1. Extensive atherosclerosis of the aorta. Bones are  osteopenic. IMPRESSION: Advanced lower thoracic and lumbar degenerative spondylosis at all levels and lower lumbar facet arthropathy. Chronic compression fractures at T11, T12, and L1 appearing grossly similar. No significant interval change or new finding by plain radiography Aortoiliac atherosclerosis Electronically Signed   By: Jerilynn Mages.  Shick M.D.   On: 01/13/2017 14:15   Ct Head Wo Contrast  Result Date: 01/13/2017 CLINICAL DATA:  Pt was sitting in wheelchair, states she reached for something and fell out of wheelchair. Pt has contusion to right side of head. Pt is not on blood thinners. Pt denies loss of consciousness. Pt denies pain. EXAM: CT HEAD WITHOUT CONTRAST CT CERVICAL SPINE WITHOUT CONTRAST TECHNIQUE: Multidetector CT imaging of the head and cervical spine was performed following the standard protocol without intravenous contrast. Multiplanar CT image reconstructions of the cervical spine were also generated. FINDINGS: CT HEAD FINDINGS Brain: No evidence of acute infarction, hemorrhage, hydrocephalus, extra-axial collection or mass lesion/mass effect. There is age uppercase ventricular and sulcal enlargement. Mild periventricular white matter hypoattenuation is also noted consistent with chronic microvascular ischemic change. Vascular: No hyperdense vessel or unexpected calcification. Skull: Normal. Negative for fracture or focal lesion. Sinuses/Orbits: Globes and orbits are unremarkable. Sinuses and mastoid air cells are clear. Other: Small right frontal scalp hematoma. CT CERVICAL SPINE FINDINGS Alignment: Normal. Skull base and vertebrae: No acute fracture. No primary bone lesion or focal pathologic process. Soft tissues and spinal canal: No prevertebral fluid or swelling. No visible canal hematoma. No soft tissue masses. There are subcentimeter thyroid nodules. Carotid artery vascular calcifications are noted. Disc levels: Moderate loss of disc height with endplate spurring at K2-I0 through C6-C7.  Minor loss of disc height with endplate spurring at X7-D5. Facet degenerative changes are noted bilaterally, greatest on the right at C2-C3 and on the left at C3-C4. No convincing disc herniation. Upper chest: Lung apices are clear. Other: None. IMPRESSION: HEAD CT: No acute intracranial abnormalities. Small right lateral frontal scalp hematoma. No fracture. CERVICAL CT:  No fracture or acute finding. Electronically Signed   By: Lajean Manes M.D.   On: 01/13/2017 13:55   Ct Angio Chest  Pe W And/or Wo Contrast  Result Date: 01/13/2017 CLINICAL DATA:  Hypoxia after fall. EXAM: CT ANGIOGRAPHY CHEST WITH CONTRAST TECHNIQUE: Multidetector CT imaging of the chest was performed using the standard protocol during bolus administration of intravenous contrast. Multiplanar CT image reconstructions and MIPs were obtained to evaluate the vascular anatomy. CONTRAST:  100 mL of Isovue 370 COMPARISON:  None. FINDINGS: Cardiovascular: Atherosclerotic changes seen in the non aneurysmal thoracic aorta. Evaluation for dissection is markedly limited due to lack of enhancement with contrast. Cardiomegaly is identified. Coronary artery calcifications are noted. Linear low attenuation is seen in the left main pulmonary artery, extending into the upper and lower lobe branches proximally. This has an appearance of mixing rather than embolus. Evaluation of the lower lobe pulmonary arterial branches is limited due to respiratory motion. Within this limitation, no pulmonary emboli are identified. Mediastinum/Nodes: No enlarged mediastinal, hilar, or axillary lymph nodes. Thyroid gland, trachea, and esophagus demonstrate no significant findings. Lungs/Pleura: Central airways are normal. There is atelectasis in the left base. No suspicious infiltrates. A few calcified nodules are consistent previous granulomatous disease. Scattered atelectasis is seen. No suspicious nodules, masses, or evidence of pneumonia. Upper Abdomen: No acute  abnormality. Musculoskeletal: Compression fractures of multiple levels appear stable since January 2017. Review of the MIP images confirms the above findings. IMPRESSION: 1. No pulmonary emboli identified as above.  Cardiomegaly. 2. Atherosclerosis in the thoracic aorta. Coronary artery calcifications. Aortic Atherosclerosis (ICD10-I70.0). Electronically Signed   By: Dorise Bullion III M.D   On: 01/13/2017 17:56   Ct Cervical Spine Wo Contrast  Result Date: 01/13/2017 CLINICAL DATA:  Pt was sitting in wheelchair, states she reached for something and fell out of wheelchair. Pt has contusion to right side of head. Pt is not on blood thinners. Pt denies loss of consciousness. Pt denies pain. EXAM: CT HEAD WITHOUT CONTRAST CT CERVICAL SPINE WITHOUT CONTRAST TECHNIQUE: Multidetector CT imaging of the head and cervical spine was performed following the standard protocol without intravenous contrast. Multiplanar CT image reconstructions of the cervical spine were also generated. FINDINGS: CT HEAD FINDINGS Brain: No evidence of acute infarction, hemorrhage, hydrocephalus, extra-axial collection or mass lesion/mass effect. There is age uppercase ventricular and sulcal enlargement. Mild periventricular white matter hypoattenuation is also noted consistent with chronic microvascular ischemic change. Vascular: No hyperdense vessel or unexpected calcification. Skull: Normal. Negative for fracture or focal lesion. Sinuses/Orbits: Globes and orbits are unremarkable. Sinuses and mastoid air cells are clear. Other: Small right frontal scalp hematoma. CT CERVICAL SPINE FINDINGS Alignment: Normal. Skull base and vertebrae: No acute fracture. No primary bone lesion or focal pathologic process. Soft tissues and spinal canal: No prevertebral fluid or swelling. No visible canal hematoma. No soft tissue masses. There are subcentimeter thyroid nodules. Carotid artery vascular calcifications are noted. Disc levels: Moderate loss of disc  height with endplate spurring at Y4-I3 through C6-C7. Minor loss of disc height with endplate spurring at K7-Q2. Facet degenerative changes are noted bilaterally, greatest on the right at C2-C3 and on the left at C3-C4. No convincing disc herniation. Upper chest: Lung apices are clear. Other: None. IMPRESSION: HEAD CT: No acute intracranial abnormalities. Small right lateral frontal scalp hematoma. No fracture. CERVICAL CT:  No fracture or acute finding. Electronically Signed   By: Lajean Manes M.D.   On: 01/13/2017 13:55    Procedures Procedures (including critical care time)  Medications Ordered in ED Medications  iopamidol (ISOVUE-370) 76 % injection (not administered)  iopamidol (ISOVUE-370) 76 % injection 100  mL (100 mLs Intravenous Contrast Given 01/13/17 1721)     Initial Impression / Assessment and Plan / ED Course  I have reviewed the triage vital signs and the nursing notes.  Pertinent labs & imaging results that were available during my care of the patient were reviewed by me and considered in my medical decision making (see chart for details).     Patient presents following fall, however new oxygen requirement in the ED. CT of the head and C-spine negative. Thoracic and lumbar x-rays stable from past. CBC shows to be BBC 16.3, possibly due to UTI currently being treated. BMP unremarkable. BNP 384. CT angiogram negative for PE. CXR shows small left pleural effusion. Considering oxygen requirements patient setting is low as 83% on room air, I consulted [facility] and spoke with Dr. [place] Pink who over the patient for further evaluation and treatment. Patient also evaluated by Dr. Billy Fischer who guided the patient's management and agrees with plan.  Final Clinical Impressions(s) / ED Diagnoses   Final diagnoses:  Fall, initial encounter  Minor head injury, initial encounter  Hypoxia    New Prescriptions New Prescriptions   No medications on file     Caryl Ada 01/13/17 2011    Gareth Morgan, MD 01/15/17 1226

## 2017-01-13 NOTE — ED Notes (Signed)
Pt's daughter  Ellen Henri [phone]  Pt's daughter is going home until pt is admitted to her room.

## 2017-01-13 NOTE — ED Notes (Signed)
ED TO INPATIENT HANDOFF REPORT  Name/Age/Gender Jean Murphy 83 y.o. female  Code Status Code Status History    Date Active Date Inactive Code Status Order ID Comments User Context   01/19/2015  1:57 PM 01/19/2015  6:07 PM DNR [id number]  Micheline Rough, MD Inpatient   01/16/2015  6:51 PM 01/19/2015  1:57 PM Full Code [id number]  Gary Fleet, PA-C Inpatient   01/16/2015 11:50 AM 01/16/2015  6:51 PM Full Code [id number]  Robbie Lis, MD ED   06/01/2014  6:38 PM 06/09/2014  8:58 PM DNR [id number]  Eugenie Filler, MD Inpatient   05/31/2014 12:05 AM 06/01/2014  6:38 PM Full Code [id number]  [name], MD Inpatient    Questions for Most Recent Historical Code Status (Order [id number])    Question Answer Comment   In the event of cardiac or respiratory ARREST Do not call a "code blue"    In the event of cardiac or respiratory ARREST Do not perform Intubation, CPR, defibrillation or ACLS    In the event of cardiac or respiratory ARREST Use medication by any route, position, wound care, and other measures to relive pain and suffering. May use oxygen, suction and manual treatment of airway obstruction as needed for comfort.       Home/SNF/Other Nursing Home  Chief Complaint Fall  Level of Care/Admitting Diagnosis ED Disposition    ED Disposition Condition Comment   Admit  Hospital Area: [facility] [[id number]]  Level of Care: Med-Surg [16]  Diagnosis: Hypoxia [233007]  Admitting Physician: Bonnielee Haff [[id number]]  Attending Physician: Bonnielee Haff [[id number]]  PT Class (Do Not Modify): Observation [104]  PT Acc Code (Do Not Modify): Observation [10022]       Medical History Past Medical History:  Diagnosis Date  . AAA (abdominal aortic aneurysm) ([place])   . CHF (congestive heart failure) ([place])   . Compound fracture    left leg  . Compression fracture    x 2   . Diabetes mellitus without complication ([place])   . Eczema   . HTN (hypertension)   .  Hyperlipidemia   . Hypertension   . Irregular heart beat   . Pleural effusion   . Stroke [facility])     Allergies Allergies  Allergen Reactions  . Penicillins Other (See Comments)    Reaction unknown per MAR  . Shellfish Allergy Hives    IV Location/Drains/Wounds Patient Lines/Drains/Airways Status   Active Line/Drains/Airways    Name:   Placement date:   Placement time:   Site:   Days:   Peripheral IV 01/13/17 Left Wrist  01/13/17    1600    Wrist    less than 1   Peripheral IV 01/13/17 Right Antecubital  01/13/17    1708    Antecubital    less than 1          Labs/Imaging Results for orders placed or performed during the hospital encounter of 01/13/17 (from the past 48 hour(s))  Basic metabolic panel     Status: Abnormal   Collection Time: 01/13/17  3:44 PM  Result Value Ref Range   Sodium 143 135 - 145 mmol/L   Potassium 4.6 3.5 - 5.1 mmol/L   Chloride 108 101 - 111 mmol/L   CO2 26 22 - 32 mmol/L   Glucose, Bld 123 (H) 65 - 99 mg/dL   BUN 16 6 - 20 mg/dL   Creatinine, Ser 0.72 0.44 - 1.00 mg/dL   Calcium 8.1 (L)  8.9 - 10.3 mg/dL   GFR calc non Af Amer >60 >60 mL/min   GFR calc Af Amer >60 >60 mL/min    Comment: (NOTE) The eGFR has been calculated using the CKD EPI equation. This calculation has not been validated in all clinical situations. eGFR's persistently <60 mL/min signify possible Chronic Kidney Disease.    Anion gap 9 5 - 15  CBC with Differential     Status: Abnormal   Collection Time: 01/13/17  3:44 PM  Result Value Ref Range   WBC 16.3 (H) 4.0 - 10.5 K/uL   RBC 4.14 3.87 - 5.11 MIL/uL   Hemoglobin 12.5 12.0 - 15.0 g/dL   HCT 38.6 36.0 - 46.0 %   MCV 93.2 78.0 - 100.0 fL   MCH 30.2 26.0 - 34.0 pg   MCHC 32.4 30.0 - 36.0 g/dL   RDW 14.8 11.5 - 15.5 %   Platelets 187 150 - 400 K/uL   Neutrophils Relative % 89 %   Neutro Abs 14.5 (H) 1.7 - 7.7 K/uL   Lymphocytes Relative 5 %   Lymphs Abs 0.8 0.7 - 4.0 K/uL   Monocytes Relative 6 %   Monocytes  Absolute 0.9 0.1 - 1.0 K/uL   Eosinophils Relative 0 %   Eosinophils Absolute 0.1 0.0 - 0.7 K/uL   Basophils Relative 0 %   Basophils Absolute 0.0 0.0 - 0.1 K/uL  Brain natriuretic peptide     Status: Abnormal   Collection Time: 01/13/17  3:45 PM  Result Value Ref Range   B Natriuretic Peptide 384.0 (H) 0.0 - 100.0 pg/mL   Dg Chest 2 View  Result Date: 01/13/2017 CLINICAL DATA:  Pain following fall.  Decreased oxygen saturation EXAM: CHEST  2 VIEW COMPARISON:  May 30, 2015 FINDINGS: There is atelectatic change in the left base with small left pleural effusion. Lungs elsewhere clear. There is cardiomegaly with pulmonary vascularity within normal limits. There is aortic atherosclerosis. No adenopathy. No pneumothorax. No bone lesions. IMPRESSION: Left base atelectasis with small left pleural effusion. No edema or consolidation. There is stable cardiac enlargement. There is aortic atherosclerosis. Aortic Atherosclerosis (ICD10-I70.0). Electronically Signed   By: Lowella Grip III M.D.   On: 01/13/2017 15:30   Dg Thoracic Spine 2 View  Result Date: 01/13/2017 CLINICAL DATA:  Fall.  History of multiple compression fractures EXAM: THORACIC SPINE 3 VIEWS COMPARISON:  Chest radiograph May 30, 2015 FINDINGS: Frontal, lateral, and swimmer's views obtained. There is marked compression of the T6 vertebral body with mild retropulsion of bone posteriorly, unchanged from prior study. There is moderately severe anterior wedging at T11 with milder anterior wedging at T12, also stable from prior study. No new fracture evident. No spondylolisthesis. There is osteoarthritic change at multiple levels. Disc space narrowing is most marked at T5-6. There is no erosive change. No paraspinous lesions. There is extensive aortic atherosclerosis. IMPRESSION: Stable compression fractures at T6, T11, and T12, most severe at T6 with mild retropulsion of bone at this level. No new fracture. No spondylolisthesis. There are  areas of osteoarthritic change, most notable at T5-6. There is aortic atherosclerosis. Aortic Atherosclerosis (ICD10-I70.0). Electronically Signed   By: Lowella Grip III M.D.   On: 01/13/2017 14:13   Dg Lumbar Spine Complete  Result Date: 01/13/2017 CLINICAL DATA:  Recent fall from wheelchair at a nursing home, back pain, chronic thoracic and lumbar compression fractures EXAM: LUMBAR SPINE - COMPLETE 4+ VIEW COMPARISON:  05/30/2015 CT reconstructions FINDINGS: Advanced lower thoracic and lumbar degenerative  spondylosis at all levels with marked disc space narrowing, sclerosis and endplate osteophytes. Vacuum disc phenomena at L4-5 on the lateral view. Similar chronic compression fractures at T11, T12, and L2. No definite new compression fracture by plain radiography. Lower lumbar facet arthropathy at L4-5 and L5-S1. Extensive atherosclerosis of the aorta. Bones are osteopenic. IMPRESSION: Advanced lower thoracic and lumbar degenerative spondylosis at all levels and lower lumbar facet arthropathy. Chronic compression fractures at T11, T12, and L1 appearing grossly similar. No significant interval change or new finding by plain radiography Aortoiliac atherosclerosis Electronically Signed   By: Jerilynn Mages.  Shick M.D.   On: 01/13/2017 14:15   Ct Head Wo Contrast  Result Date: 01/13/2017 CLINICAL DATA:  Pt was sitting in wheelchair, states she reached for something and fell out of wheelchair. Pt has contusion to right side of head. Pt is not on blood thinners. Pt denies loss of consciousness. Pt denies pain. EXAM: CT HEAD WITHOUT CONTRAST CT CERVICAL SPINE WITHOUT CONTRAST TECHNIQUE: Multidetector CT imaging of the head and cervical spine was performed following the standard protocol without intravenous contrast. Multiplanar CT image reconstructions of the cervical spine were also generated. FINDINGS: CT HEAD FINDINGS Brain: No evidence of acute infarction, hemorrhage, hydrocephalus, extra-axial collection or mass  lesion/mass effect. There is age uppercase ventricular and sulcal enlargement. Mild periventricular white matter hypoattenuation is also noted consistent with chronic microvascular ischemic change. Vascular: No hyperdense vessel or unexpected calcification. Skull: Normal. Negative for fracture or focal lesion. Sinuses/Orbits: Globes and orbits are unremarkable. Sinuses and mastoid air cells are clear. Other: Small right frontal scalp hematoma. CT CERVICAL SPINE FINDINGS Alignment: Normal. Skull base and vertebrae: No acute fracture. No primary bone lesion or focal pathologic process. Soft tissues and spinal canal: No prevertebral fluid or swelling. No visible canal hematoma. No soft tissue masses. There are subcentimeter thyroid nodules. Carotid artery vascular calcifications are noted. Disc levels: Moderate loss of disc height with endplate spurring at H8-I5 through C6-C7. Minor loss of disc height with endplate spurring at O2-D7. Facet degenerative changes are noted bilaterally, greatest on the right at C2-C3 and on the left at C3-C4. No convincing disc herniation. Upper chest: Lung apices are clear. Other: None. IMPRESSION: HEAD CT: No acute intracranial abnormalities. Small right lateral frontal scalp hematoma. No fracture. CERVICAL CT:  No fracture or acute finding. Electronically Signed   By: Lajean Manes M.D.   On: 01/13/2017 13:55   Ct Angio Chest Pe W And/or Wo Contrast  Result Date: 01/13/2017 CLINICAL DATA:  Hypoxia after fall. EXAM: CT ANGIOGRAPHY CHEST WITH CONTRAST TECHNIQUE: Multidetector CT imaging of the chest was performed using the standard protocol during bolus administration of intravenous contrast. Multiplanar CT image reconstructions and MIPs were obtained to evaluate the vascular anatomy. CONTRAST:  100 mL of Isovue 370 COMPARISON:  None. FINDINGS: Cardiovascular: Atherosclerotic changes seen in the non aneurysmal thoracic aorta. Evaluation for dissection is markedly limited due to lack  of enhancement with contrast. Cardiomegaly is identified. Coronary artery calcifications are noted. Linear low attenuation is seen in the left main pulmonary artery, extending into the upper and lower lobe branches proximally. This has an appearance of mixing rather than embolus. Evaluation of the lower lobe pulmonary arterial branches is limited due to respiratory motion. Within this limitation, no pulmonary emboli are identified. Mediastinum/Nodes: No enlarged mediastinal, hilar, or axillary lymph nodes. Thyroid gland, trachea, and esophagus demonstrate no significant findings. Lungs/Pleura: Central airways are normal. There is atelectasis in the left base. No suspicious infiltrates. A  few calcified nodules are consistent previous granulomatous disease. Scattered atelectasis is seen. No suspicious nodules, masses, or evidence of pneumonia. Upper Abdomen: No acute abnormality. Musculoskeletal: Compression fractures of multiple levels appear stable since January 2017. Review of the MIP images confirms the above findings. IMPRESSION: 1. No pulmonary emboli identified as above.  Cardiomegaly. 2. Atherosclerosis in the thoracic aorta. Coronary artery calcifications. Aortic Atherosclerosis (ICD10-I70.0). Electronically Signed   By: Dorise Bullion III M.D   On: 01/13/2017 17:56   Ct Cervical Spine Wo Contrast  Result Date: 01/13/2017 CLINICAL DATA:  Pt was sitting in wheelchair, states she reached for something and fell out of wheelchair. Pt has contusion to right side of head. Pt is not on blood thinners. Pt denies loss of consciousness. Pt denies pain. EXAM: CT HEAD WITHOUT CONTRAST CT CERVICAL SPINE WITHOUT CONTRAST TECHNIQUE: Multidetector CT imaging of the head and cervical spine was performed following the standard protocol without intravenous contrast. Multiplanar CT image reconstructions of the cervical spine were also generated. FINDINGS: CT HEAD FINDINGS Brain: No evidence of acute infarction, hemorrhage,  hydrocephalus, extra-axial collection or mass lesion/mass effect. There is age uppercase ventricular and sulcal enlargement. Mild periventricular white matter hypoattenuation is also noted consistent with chronic microvascular ischemic change. Vascular: No hyperdense vessel or unexpected calcification. Skull: Normal. Negative for fracture or focal lesion. Sinuses/Orbits: Globes and orbits are unremarkable. Sinuses and mastoid air cells are clear. Other: Small right frontal scalp hematoma. CT CERVICAL SPINE FINDINGS Alignment: Normal. Skull base and vertebrae: No acute fracture. No primary bone lesion or focal pathologic process. Soft tissues and spinal canal: No prevertebral fluid or swelling. No visible canal hematoma. No soft tissue masses. There are subcentimeter thyroid nodules. Carotid artery vascular calcifications are noted. Disc levels: Moderate loss of disc height with endplate spurring at Z1-I9 through C6-C7. Minor loss of disc height with endplate spurring at C7-E9. Facet degenerative changes are noted bilaterally, greatest on the right at C2-C3 and on the left at C3-C4. No convincing disc herniation. Upper chest: Lung apices are clear. Other: None. IMPRESSION: HEAD CT: No acute intracranial abnormalities. Small right lateral frontal scalp hematoma. No fracture. CERVICAL CT:  No fracture or acute finding. Electronically Signed   By: Lajean Manes M.D.   On: 01/13/2017 13:55    Pending Labs Unresulted Labs    Start     Ordered   01/13/17 2010  Urinalysis, Routine w reflex microscopic  STAT,   STAT     01/13/17 2009   Signed and Held  Urinalysis, Routine w reflex microscopic  Once,   R     Signed and Held   Signed and Held  Digoxin level  Once,   R     Signed and Held   Signed and Held  Basic metabolic panel  Tomorrow morning,   R     Signed and Held   Signed and Held  CBC  Tomorrow morning,   R     Signed and Held      Vitals/Pain Today's Vitals   01/13/17 1300 01/13/17 1557 01/13/17  1803  BP: (!) 148/93 (!) 157/83 (!) 158/99  Pulse: 75 70 64  Resp: 16 (!) 26 (!) 24  Temp: 97.6 F (36.4 C) 98.1 F (36.7 C) 97.8 F (36.6 C)  TempSrc: Oral Oral Oral  SpO2: (!) 86% 98% 96%    Isolation Precautions No active isolations  Medications Medications  iopamidol (ISOVUE-370) 76 % injection (not administered)  iopamidol (ISOVUE-370) 76 % injection 100 mL (  100 mLs Intravenous Contrast Given 01/13/17 1721)    Mobility manual wheelchair

## 2017-01-14 DIAGNOSIS — F329 Major depressive disorder, single episode, unspecified: Secondary | ICD-10-CM | POA: Diagnosis present

## 2017-01-14 DIAGNOSIS — N390 Urinary tract infection, site not specified: Secondary | ICD-10-CM | POA: Diagnosis present

## 2017-01-14 DIAGNOSIS — W050XXA Fall from non-moving wheelchair, initial encounter: Secondary | ICD-10-CM | POA: Diagnosis present

## 2017-01-14 DIAGNOSIS — M47814 Spondylosis without myelopathy or radiculopathy, thoracic region: Secondary | ICD-10-CM | POA: Diagnosis present

## 2017-01-14 DIAGNOSIS — Y92099 Unspecified place in other non-institutional residence as the place of occurrence of the external cause: Secondary | ICD-10-CM | POA: Diagnosis not present

## 2017-01-14 DIAGNOSIS — Z9981 Dependence on supplemental oxygen: Secondary | ICD-10-CM | POA: Diagnosis not present

## 2017-01-14 DIAGNOSIS — J9601 Acute respiratory failure with hypoxia: Secondary | ICD-10-CM | POA: Diagnosis present

## 2017-01-14 DIAGNOSIS — Z91013 Allergy to seafood: Secondary | ICD-10-CM | POA: Diagnosis not present

## 2017-01-14 DIAGNOSIS — M4854XA Collapsed vertebra, not elsewhere classified, thoracic region, initial encounter for fracture: Secondary | ICD-10-CM | POA: Diagnosis present

## 2017-01-14 DIAGNOSIS — M25561 Pain in right knee: Secondary | ICD-10-CM | POA: Diagnosis present

## 2017-01-14 DIAGNOSIS — M47816 Spondylosis without myelopathy or radiculopathy, lumbar region: Secondary | ICD-10-CM | POA: Diagnosis present

## 2017-01-14 DIAGNOSIS — D509 Iron deficiency anemia, unspecified: Secondary | ICD-10-CM | POA: Diagnosis present

## 2017-01-14 DIAGNOSIS — E785 Hyperlipidemia, unspecified: Secondary | ICD-10-CM | POA: Diagnosis present

## 2017-01-14 DIAGNOSIS — Z66 Do not resuscitate: Secondary | ICD-10-CM | POA: Diagnosis present

## 2017-01-14 DIAGNOSIS — Z7951 Long term (current) use of inhaled steroids: Secondary | ICD-10-CM | POA: Diagnosis not present

## 2017-01-14 DIAGNOSIS — M79604 Pain in right leg: Secondary | ICD-10-CM | POA: Diagnosis present

## 2017-01-14 DIAGNOSIS — E119 Type 2 diabetes mellitus without complications: Secondary | ICD-10-CM | POA: Diagnosis not present

## 2017-01-14 DIAGNOSIS — I480 Paroxysmal atrial fibrillation: Secondary | ICD-10-CM | POA: Diagnosis not present

## 2017-01-14 DIAGNOSIS — M25511 Pain in right shoulder: Secondary | ICD-10-CM | POA: Diagnosis not present

## 2017-01-14 DIAGNOSIS — Z7982 Long term (current) use of aspirin: Secondary | ICD-10-CM | POA: Diagnosis not present

## 2017-01-14 DIAGNOSIS — I509 Heart failure, unspecified: Secondary | ICD-10-CM | POA: Diagnosis not present

## 2017-01-14 DIAGNOSIS — I714 Abdominal aortic aneurysm, without rupture: Secondary | ICD-10-CM | POA: Diagnosis present

## 2017-01-14 DIAGNOSIS — Z880 Allergy status to penicillin: Secondary | ICD-10-CM | POA: Diagnosis not present

## 2017-01-14 DIAGNOSIS — I110 Hypertensive heart disease with heart failure: Secondary | ICD-10-CM | POA: Diagnosis not present

## 2017-01-14 DIAGNOSIS — D638 Anemia in other chronic diseases classified elsewhere: Secondary | ICD-10-CM | POA: Diagnosis not present

## 2017-01-14 DIAGNOSIS — M4856XA Collapsed vertebra, not elsewhere classified, lumbar region, initial encounter for fracture: Secondary | ICD-10-CM | POA: Diagnosis present

## 2017-01-14 DIAGNOSIS — E1165 Type 2 diabetes mellitus with hyperglycemia: Secondary | ICD-10-CM | POA: Diagnosis present

## 2017-01-14 DIAGNOSIS — I5032 Chronic diastolic (congestive) heart failure: Secondary | ICD-10-CM | POA: Diagnosis present

## 2017-01-14 DIAGNOSIS — R0902 Hypoxemia: Secondary | ICD-10-CM

## 2017-01-14 DIAGNOSIS — R410 Disorientation, unspecified: Secondary | ICD-10-CM | POA: Diagnosis present

## 2017-01-14 DIAGNOSIS — Z8673 Personal history of transient ischemic attack (TIA), and cerebral infarction without residual deficits: Secondary | ICD-10-CM | POA: Diagnosis not present

## 2017-01-14 DIAGNOSIS — Z79899 Other long term (current) drug therapy: Secondary | ICD-10-CM | POA: Diagnosis not present

## 2017-01-14 DIAGNOSIS — S0003XA Contusion of scalp, initial encounter: Secondary | ICD-10-CM | POA: Diagnosis present

## 2017-01-14 LAB — BASIC METABOLIC PANEL
Anion gap: 10 (ref 5–15)
BUN: 18 mg/dL (ref 6–20)
CALCIUM: 8.9 mg/dL (ref 8.9–10.3)
CO2: 30 mmol/L (ref 22–32)
CREATININE: 0.81 mg/dL (ref 0.44–1.00)
Chloride: 108 mmol/L (ref 101–111)
GFR calc non Af Amer: 59 mL/min — ABNORMAL LOW (ref >60)
Glucose, Bld: 140 mg/dL — ABNORMAL HIGH (ref 65–99)
Potassium: 3.5 mmol/L (ref 3.5–5.1)
SODIUM: 148 mmol/L — AB (ref 135–145)

## 2017-01-14 LAB — URINALYSIS, ROUTINE W REFLEX MICROSCOPIC
Bilirubin Urine: NEGATIVE
Glucose, UA: NEGATIVE mg/dL
Hgb urine dipstick: NEGATIVE
Ketones, ur: 5 mg/dL — AB
Nitrite: NEGATIVE
Protein, ur: 30 mg/dL — AB
Specific Gravity, Urine: 1.033 — ABNORMAL HIGH (ref 1.005–1.030)
pH: 6 (ref 5.0–8.0)

## 2017-01-14 LAB — MRSA PCR SCREENING: MRSA BY PCR: NEGATIVE

## 2017-01-14 LAB — CBC
HCT: 38 % (ref 36.0–46.0)
Hemoglobin: 12.3 g/dL (ref 12.0–15.0)
MCH: 30.1 pg (ref 26.0–34.0)
MCHC: 32.4 g/dL (ref 30.0–36.0)
MCV: 93.1 fL (ref 78.0–100.0)
PLATELETS: 173 10*3/uL (ref 150–400)
RBC: 4.08 MIL/uL (ref 3.87–5.11)
RDW: 14.8 % (ref 11.5–15.5)
WBC: 12.4 10*3/uL — AB (ref 4.0–10.5)

## 2017-01-14 MED ORDER — CIPROFLOXACIN IN D5W 400 MG/200ML IV SOLN
400.0000 mg | Freq: Two times a day (BID) | INTRAVENOUS | Status: DC
  Administered 2017-01-14 – 2017-01-15 (×3): 400 mg via INTRAVENOUS
  Filled 2017-01-14 (×3): qty 200

## 2017-01-14 MED ORDER — HYDRALAZINE HCL 20 MG/ML IJ SOLN
10.0000 mg | INTRAMUSCULAR | Status: DC | PRN
  Administered 2017-01-14 (×2): 10 mg via INTRAVENOUS
  Filled 2017-01-14 (×2): qty 0.5

## 2017-01-14 MED ORDER — HALOPERIDOL LACTATE 5 MG/ML IJ SOLN
1.0000 mg | Freq: Four times a day (QID) | INTRAMUSCULAR | Status: AC | PRN
  Administered 2017-01-14 (×2): 1 mg via INTRAVENOUS
  Filled 2017-01-14 (×2): qty 1

## 2017-01-14 NOTE — Evaluation (Signed)
Physical Therapy Evaluation Patient Details Name: Jean Murphy MRN: [id number] DOB: 05/18/1918 Today's Date: 01/14/2017   History of Present Illness  Jean Murphy is a 83 y.o. female who  has a past medical history of atrial fibrillation , history of hypertension, history of for diastolic CHF, history of depression who was in her usual state of health at [facility] when she was sitting on her wheelchair and she was reaching for something and fell out of her wheelchair. patient  found to be hypoxic in ED..negative for PE. CT of spine_ Stable compression fractures at T6, T11, and T12, most severe at T6  Clinical Impression  The patient was unable to provide any information  In regards to PLOF. Assisted  Patient to sitting at the bedside.  Pt admitted with above diagnosis. Pt currently with functional limitations due to the deficits listed below (see PT Problem List).  Pt will benefit from skilled PT to increase their independence and safety with mobility to allow discharge to the venue listed below.       Follow Up Recommendations SNF (or return to ALF )    Equipment Recommendations  None recommended by PT    Recommendations for Other Services       Precautions / Restrictions Precautions Precautions: Fall      Mobility  Bed Mobility Overal bed mobility: Needs Assistance Bed Mobility: Supine to Sit;Sit to Supine     Supine to sit: Total assist Sit to supine: Total assist   General bed mobility comments: use of bed pad to turn and scoot patient  Transfers                 General transfer comment: not attempted/  Ambulation/Gait                Stairs            Wheelchair Mobility    Modified Rankin (Stroke Patients Only)       Balance Overall balance assessment: History of Falls;Needs assistance Sitting-balance support: Feet supported;Bilateral upper extremity supported Sitting balance-Leahy Scale: Fair Sitting balance - Comments: sits at  midline                                     Pertinent Vitals/Pain Pain Assessment: No/denies pain    Home Living Family/patient expects to be discharged to:: Assisted living                 Additional Comments: unsure o f PLOF,  apparnatly in WC at ALF    Prior Function Level of Independence: Needs assistance               Hand Dominance        Extremity/Trunk Assessment   Upper Extremity Assessment Upper Extremity Assessment: Defer to OT evaluation RUE Deficits / Details: mittens in place     Lower Extremity Assessment Lower Extremity Assessment: Generalized weakness       Communication   Communication: HOH  Cognition Arousal/Alertness: Awake/alert   Overall Cognitive Status: No family/caregiver present to determine baseline cognitive functioning                                 General Comments: patient no oriented, most of what she says does not make sense      General Comments  Exercises     Assessment/Plan    PT Assessment Patient needs continued PT services  PT Problem List Decreased strength;Decreased range of motion;Decreased activity tolerance;Decreased mobility;Decreased knowledge of precautions;Decreased safety awareness;Decreased knowledge of use of DME;Decreased cognition       PT Treatment Interventions Functional mobility training;Therapeutic activities;Therapeutic exercise    PT Goals (Current goals can be found in the Care Plan section)  Acute Rehab PT Goals Patient Stated Goal: patient unable PT Goal Formulation: Patient unable to participate in goal setting Time For Goal Achievement: 01/28/17 Potential to Achieve Goals: Fair    Frequency Min 2X/week   Barriers to discharge        Co-evaluation               AM-PAC PT "6 Clicks" Daily Activity  Outcome Measure Difficulty turning over in bed (including adjusting bedclothes, sheets and blankets)?: Unable Difficulty moving from  lying on back to sitting on the side of the bed? : Unable Difficulty sitting down on and standing up from a chair with arms (e.g., wheelchair, bedside commode, etc,.)?: Unable Help needed moving to and from a bed to chair (including a wheelchair)?: Total Help needed walking in hospital room?: Total Help needed climbing 3-5 steps with a railing? : Total 6 Click Score: 6    End of Session   Activity Tolerance: Patient tolerated treatment well Patient left: in bed;with call bell/phone within reach;with bed alarm set Nurse Communication: Mobility status PT Visit Diagnosis: History of falling (Z91.81)    Time: 4854-6270 PT Time Calculation (min) (ACUTE ONLY): 14 min   Charges:   PT Evaluation $PT Eval Low Complexity: 1 Low     PT G CodesTresa Murphy PT [phone]   Jean Murphy 01/14/2017, 5:05 PM

## 2017-01-14 NOTE — Progress Notes (Addendum)
Patient ID: Jean Murphy, female   DOB: 10/24/1917, 83 y.o.   MRN: [id number]  PROGRESS NOTE    Jean Murphy  GOT:[id number] DOB: 12-12-1917 DOA: 01/13/2017  PCP: Antony Blackbird, MD   Brief Narrative:  83 year old female, lives in assisted living facility, medical history significant for atrial fibrillation, not on anticoagulation, hypertension, diastolic CHF, depression. Patient was sitting on her wheelchair and fell out off her wheelchair. There was no loss of consciousness. There was a contusion to the right side of the head and patient was brought to ED for further evaluation. In ED, patient was distracted, confused but not known what her baseline mental status is. Family is not at the bedside to provide details of medical history. Patient was hypoxic with saturations in the low 80s. She was placed on oxygen support via nasal cannula. Further, patient was found to have urinary tract infection and was started on Cipro.  Assessment & Plan:   Principal Problem: Acute respiratory failure with hypoxia - Chest x-ray showed left base atelectasis with small left pleural effusion but no edema or consolidation - CT angiogram chest showed no pulmonary embolism - Patient's oxygen saturation is 95% with nasal cannula oxygen support - Stable respiratory status  Active Problems: Fall - No acute findings and CT head and CT cervical spine - Thoracic spine x-ray showed stable compression fractures at T6, T11 and T12, no new fractures - Lumbar spine x-ray showed advanced lower thoracic and lumbar degenerative spondylosis at all levels as well as chronic compression fractures - PT eval pending   UTI - Started empiric cipro - UA with trace leukocytes   Paroxysmal atrial fibrillation (HCC) - CHADS vasc score at least 4 - On aspirin - Rate controlled with digoxin and metoprolol   Essential hypertension - Continue hydralazine, metoprolol   Depression - Continue paxil  Anemia of chronic  disease / Iron deficiency anemia  - Continue ferrous sulfate supplementation   Chronic diastolic CHF - Compensated - BNP 384 on this admission  - Continue lasix  - ECHO 02/2014 showed preserved EF    DVT prophylaxis: Heparin subQ Code Status: DNR/DNI  Family Communication: no family at the bedside  Disposition Plan: PT eval pending    Consultants:   PT  Procedures:   None   Antimicrobials:   Cipro 01/14/2017 -->   Subjective: No overnight events.  Objective: Vitals:   01/13/17 1803 01/13/17 2115 01/13/17 2121 01/14/17 0502  BP: (!) 158/99 (!) 177/108  (!) 177/68  Pulse: 64 75  62  Resp: (!) 24 18  18   Temp: 97.8 F (36.6 C) 98.1 F (36.7 C)  (!) 97.5 F (36.4 C)  TempSrc: Oral Oral  Oral  SpO2: 96% 97% 97% 95%  Weight:  58.3 kg (128 lb 8.5 oz)    Height:  5\' 5"  (1.651 m)      Intake/Output Summary (Last 24 hours) at 01/14/17 0802 Last data filed at 01/13/17 2200  Gross per 24 hour  Intake               10 ml  Output                0 ml  Net               10 ml   Filed Weights   01/13/17 2115  Weight: 58.3 kg (128 lb 8.5 oz)    Examination:  General exam: Appears calm and comfortable  Respiratory system: Clear to auscultation. Respiratory  effort normal. Cardiovascular system: S1 & S2 heard, Rate controlled  Gastrointestinal system: Abdomen is nondistended, soft and nontender. No organomegaly or masses felt. Normal bowel sounds heard. Central nervous system: disoriented, has mittens on Extremities: Symmetric 5 x 5 power. Skin: No rashes, lesions or ulcers Psychiatry: not restless or agitated   Data Reviewed: I have personally reviewed following labs and imaging studies  CBC:  Recent Labs Lab 01/13/17 1544 01/14/17 0429  WBC 16.3* 12.4*  NEUTROABS 14.5*  --   HGB 12.5 12.3  HCT 38.6 38.0  MCV 93.2 93.1  PLT 187 725   Basic Metabolic Panel:  Recent Labs Lab 01/13/17 1544 01/14/17 0429  NA 143 148*  K 4.6 3.5  CL 108 108  CO2 26  30  GLUCOSE 123* 140*  BUN 16 18  CREATININE 0.72 0.81  CALCIUM 8.1* 8.9   GFR: Estimated Creatinine Clearance: 35.7 mL/min (by C-G formula based on SCr of 0.81 mg/dL). Liver Function Tests: No results for input(s): AST, ALT, ALKPHOS, BILITOT, PROT, ALBUMIN in the last 168 hours. No results for input(s): LIPASE, AMYLASE in the last 168 hours. No results for input(s): AMMONIA in the last 168 hours. Coagulation Profile: No results for input(s): INR, PROTIME in the last 168 hours. Cardiac Enzymes: No results for input(s): CKTOTAL, CKMB, CKMBINDEX, TROPONINI in the last 168 hours. BNP (last 3 results) No results for input(s): PROBNP in the last 8760 hours. HbA1C: No results for input(s): HGBA1C in the last 72 hours. CBG: No results for input(s): GLUCAP in the last 168 hours. Lipid Profile: No results for input(s): CHOL, HDL, LDLCALC, TRIG, CHOLHDL, LDLDIRECT in the last 72 hours. Thyroid Function Tests: No results for input(s): TSH, T4TOTAL, FREET4, T3FREE, THYROIDAB in the last 72 hours. Anemia Panel: No results for input(s): VITAMINB12, FOLATE, FERRITIN, TIBC, IRON, RETICCTPCT in the last 72 hours. Urine analysis:    Component Value Date/Time   COLORURINE YELLOW 01/14/2017 0619   APPEARANCEUR CLEAR 01/14/2017 0619   LABSPEC 1.033 (H) 01/14/2017 0619   PHURINE 6.0 01/14/2017 [place] 01/14/2017 0619   HGBUR NEGATIVE 01/14/2017 0619   BILIRUBINUR NEGATIVE 01/14/2017 0619   KETONESUR 5 (A) 01/14/2017 0619   PROTEINUR 30 (A) 01/14/2017 0619   UROBILINOGEN 1.0 05/30/2014 2036   NITRITE NEGATIVE 01/14/2017 0619   LEUKOCYTESUR TRACE (A) 01/14/2017 0619   Sepsis Labs: @LABRCNTIP (procalcitonin:4,lacticidven:4)   )No results found for this or any previous visit (from the past 240 hour(s)).    Radiology Studies: Dg Chest 2 View  Result Date: 01/13/2017 CLINICAL DATA:  Pain following fall.  Decreased oxygen saturation EXAM: CHEST  2 VIEW COMPARISON:  May 30, 2015 FINDINGS: There is atelectatic change in the left base with small left pleural effusion. Lungs elsewhere clear. There is cardiomegaly with pulmonary vascularity within normal limits. There is aortic atherosclerosis. No adenopathy. No pneumothorax. No bone lesions. IMPRESSION: Left base atelectasis with small left pleural effusion. No edema or consolidation. There is stable cardiac enlargement. There is aortic atherosclerosis. Aortic Atherosclerosis (ICD10-I70.0). Electronically Signed   By: Lowella Grip III M.D.   On: 01/13/2017 15:30   Dg Thoracic Spine 2 View  Result Date: 01/13/2017 CLINICAL DATA:  Fall.  History of multiple compression fractures EXAM: THORACIC SPINE 3 VIEWS COMPARISON:  Chest radiograph May 30, 2015 FINDINGS: Frontal, lateral, and swimmer's views obtained. There is marked compression of the T6 vertebral body with mild retropulsion of bone posteriorly, unchanged from prior study. There is moderately severe anterior wedging at  T11 with milder anterior wedging at T12, also stable from prior study. No new fracture evident. No spondylolisthesis. There is osteoarthritic change at multiple levels. Disc space narrowing is most marked at T5-6. There is no erosive change. No paraspinous lesions. There is extensive aortic atherosclerosis. IMPRESSION: Stable compression fractures at T6, T11, and T12, most severe at T6 with mild retropulsion of bone at this level. No new fracture. No spondylolisthesis. There are areas of osteoarthritic change, most notable at T5-6. There is aortic atherosclerosis. Aortic Atherosclerosis (ICD10-I70.0). Electronically Signed   By: Lowella Grip III M.D.   On: 01/13/2017 14:13   Dg Lumbar Spine Complete  Result Date: 01/13/2017 CLINICAL DATA:  Recent fall from wheelchair at a nursing home, back pain, chronic thoracic and lumbar compression fractures EXAM: LUMBAR SPINE - COMPLETE 4+ VIEW COMPARISON:  05/30/2015 CT reconstructions FINDINGS: Advanced lower  thoracic and lumbar degenerative spondylosis at all levels with marked disc space narrowing, sclerosis and endplate osteophytes. Vacuum disc phenomena at L4-5 on the lateral view. Similar chronic compression fractures at T11, T12, and L2. No definite new compression fracture by plain radiography. Lower lumbar facet arthropathy at L4-5 and L5-S1. Extensive atherosclerosis of the aorta. Bones are osteopenic. IMPRESSION: Advanced lower thoracic and lumbar degenerative spondylosis at all levels and lower lumbar facet arthropathy. Chronic compression fractures at T11, T12, and L1 appearing grossly similar. No significant interval change or new finding by plain radiography Aortoiliac atherosclerosis Electronically Signed   By: Jerilynn Mages.  Shick M.D.   On: 01/13/2017 14:15   Ct Head Wo Contrast  Result Date: 01/13/2017 CLINICAL DATA:  Pt was sitting in wheelchair, states she reached for something and fell out of wheelchair. Pt has contusion to right side of head. Pt is not on blood thinners. Pt denies loss of consciousness. Pt denies pain. EXAM: CT HEAD WITHOUT CONTRAST CT CERVICAL SPINE WITHOUT CONTRAST TECHNIQUE: Multidetector CT imaging of the head and cervical spine was performed following the standard protocol without intravenous contrast. Multiplanar CT image reconstructions of the cervical spine were also generated. FINDINGS: CT HEAD FINDINGS Brain: No evidence of acute infarction, hemorrhage, hydrocephalus, extra-axial collection or mass lesion/mass effect. There is age uppercase ventricular and sulcal enlargement. Mild periventricular white matter hypoattenuation is also noted consistent with chronic microvascular ischemic change. Vascular: No hyperdense vessel or unexpected calcification. Skull: Normal. Negative for fracture or focal lesion. Sinuses/Orbits: Globes and orbits are unremarkable. Sinuses and mastoid air cells are clear. Other: Small right frontal scalp hematoma. CT CERVICAL SPINE FINDINGS Alignment:  Normal. Skull base and vertebrae: No acute fracture. No primary bone lesion or focal pathologic process. Soft tissues and spinal canal: No prevertebral fluid or swelling. No visible canal hematoma. No soft tissue masses. There are subcentimeter thyroid nodules. Carotid artery vascular calcifications are noted. Disc levels: Moderate loss of disc height with endplate spurring at B5-Z0 through C6-C7. Minor loss of disc height with endplate spurring at C5-E5. Facet degenerative changes are noted bilaterally, greatest on the right at C2-C3 and on the left at C3-C4. No convincing disc herniation. Upper chest: Lung apices are clear. Other: None. IMPRESSION: HEAD CT: No acute intracranial abnormalities. Small right lateral frontal scalp hematoma. No fracture. CERVICAL CT:  No fracture or acute finding. Electronically Signed   By: Lajean Manes M.D.   On: 01/13/2017 13:55   Ct Angio Chest Pe W And/or Wo Contrast  Result Date: 01/13/2017 CLINICAL DATA:  Hypoxia after fall. EXAM: CT ANGIOGRAPHY CHEST WITH CONTRAST TECHNIQUE: Multidetector CT imaging of the chest  was performed using the standard protocol during bolus administration of intravenous contrast. Multiplanar CT image reconstructions and MIPs were obtained to evaluate the vascular anatomy. CONTRAST:  100 mL of Isovue 370 COMPARISON:  None. FINDINGS: Cardiovascular: Atherosclerotic changes seen in the non aneurysmal thoracic aorta. Evaluation for dissection is markedly limited due to lack of enhancement with contrast. Cardiomegaly is identified. Coronary artery calcifications are noted. Linear low attenuation is seen in the left main pulmonary artery, extending into the upper and lower lobe branches proximally. This has an appearance of mixing rather than embolus. Evaluation of the lower lobe pulmonary arterial branches is limited due to respiratory motion. Within this limitation, no pulmonary emboli are identified. Mediastinum/Nodes: No enlarged mediastinal, hilar,  or axillary lymph nodes. Thyroid gland, trachea, and esophagus demonstrate no significant findings. Lungs/Pleura: Central airways are normal. There is atelectasis in the left base. No suspicious infiltrates. A few calcified nodules are consistent previous granulomatous disease. Scattered atelectasis is seen. No suspicious nodules, masses, or evidence of pneumonia. Upper Abdomen: No acute abnormality. Musculoskeletal: Compression fractures of multiple levels appear stable since January 2017. Review of the MIP images confirms the above findings. IMPRESSION: 1. No pulmonary emboli identified as above.  Cardiomegaly. 2. Atherosclerosis in the thoracic aorta. Coronary artery calcifications. Aortic Atherosclerosis (ICD10-I70.0). Electronically Signed   By: Dorise Bullion III M.D   On: 01/13/2017 17:56   Ct Cervical Spine Wo Contrast  Result Date: 01/13/2017 CLINICAL DATA:  Pt was sitting in wheelchair, states she reached for something and fell out of wheelchair. Pt has contusion to right side of head. Pt is not on blood thinners. Pt denies loss of consciousness. Pt denies pain. EXAM: CT HEAD WITHOUT CONTRAST CT CERVICAL SPINE WITHOUT CONTRAST TECHNIQUE: Multidetector CT imaging of the head and cervical spine was performed following the standard protocol without intravenous contrast. Multiplanar CT image reconstructions of the cervical spine were also generated. FINDINGS: CT HEAD FINDINGS Brain: No evidence of acute infarction, hemorrhage, hydrocephalus, extra-axial collection or mass lesion/mass effect. There is age uppercase ventricular and sulcal enlargement. Mild periventricular white matter hypoattenuation is also noted consistent with chronic microvascular ischemic change. Vascular: No hyperdense vessel or unexpected calcification. Skull: Normal. Negative for fracture or focal lesion. Sinuses/Orbits: Globes and orbits are unremarkable. Sinuses and mastoid air cells are clear. Other: Small right frontal scalp  hematoma. CT CERVICAL SPINE FINDINGS Alignment: Normal. Skull base and vertebrae: No acute fracture. No primary bone lesion or focal pathologic process. Soft tissues and spinal canal: No prevertebral fluid or swelling. No visible canal hematoma. No soft tissue masses. There are subcentimeter thyroid nodules. Carotid artery vascular calcifications are noted. Disc levels: Moderate loss of disc height with endplate spurring at Z6-X0 through C6-C7. Minor loss of disc height with endplate spurring at R6-E4. Facet degenerative changes are noted bilaterally, greatest on the right at C2-C3 and on the left at C3-C4. No convincing disc herniation. Upper chest: Lung apices are clear. Other: None. IMPRESSION: HEAD CT: No acute intracranial abnormalities. Small right lateral frontal scalp hematoma. No fracture. CERVICAL CT:  No fracture or acute finding. Electronically Signed   By: Lajean Manes M.D.   On: 01/13/2017 13:55        Scheduled Meds: . aspirin EC  81 mg Oral QHS  . digoxin  0.125 mg Oral Daily  . famotidine  20 mg Oral QHS  . ferrous sulfate  325 mg Oral Q breakfast  . fluticasone  1 spray Each Nare Daily  . furosemide  40 mg  Oral Daily  . gabapentin  100 mg Oral QHS  . heparin  5,000 Units Subcutaneous Q8H  . hydrALAZINE  50 mg Oral TID  . ipratropium  2 spray Each Nare Daily  . ipratropium-albuterol  3 mL Inhalation BID  . metoprolol tartrate  25 mg Oral BID  . mometasone-formoterol  2 puff Inhalation BID  . PARoxetine  10 mg Oral Daily  . potassium chloride SA  20 mEq Oral Daily  . sodium chloride flush  3 mL Intravenous Q12H  . tiotropium  18 mcg Inhalation Daily   Continuous Infusions: . sodium chloride    . ciprofloxacin       LOS: 0 days    Time spent: 25 minutes  Greater than 50% of the time spent on counseling and coordinating the care.   Leisa Lenz, MD [facility] Pager [phone]  If 7PM-7AM, please contact night-coverage [email or web address] Password  [email or web address] 01/14/2017, 8:02 AM

## 2017-01-15 ENCOUNTER — Encounter (HOSPITAL_COMMUNITY): Payer: Self-pay | Admitting: Emergency Medicine

## 2017-01-15 ENCOUNTER — Emergency Department (HOSPITAL_COMMUNITY): Payer: Medicare HMO

## 2017-01-15 ENCOUNTER — Emergency Department (HOSPITAL_COMMUNITY)
Admission: EM | Admit: 2017-01-15 | Discharge: 2017-01-15 | Disposition: A | Discharge status: Home / Self Care | Payer: Medicare HMO | Attending: Emergency Medicine | Admitting: Emergency Medicine

## 2017-01-15 DIAGNOSIS — I509 Heart failure, unspecified: Secondary | ICD-10-CM | POA: Insufficient documentation

## 2017-01-15 DIAGNOSIS — Z79899 Other long term (current) drug therapy: Secondary | ICD-10-CM | POA: Insufficient documentation

## 2017-01-15 DIAGNOSIS — Z9981 Dependence on supplemental oxygen: Secondary | ICD-10-CM

## 2017-01-15 DIAGNOSIS — M25561 Pain in right knee: Secondary | ICD-10-CM | POA: Diagnosis not present

## 2017-01-15 DIAGNOSIS — E119 Type 2 diabetes mellitus without complications: Secondary | ICD-10-CM | POA: Insufficient documentation

## 2017-01-15 DIAGNOSIS — I110 Hypertensive heart disease with heart failure: Secondary | ICD-10-CM | POA: Insufficient documentation

## 2017-01-15 DIAGNOSIS — Z7982 Long term (current) use of aspirin: Secondary | ICD-10-CM | POA: Insufficient documentation

## 2017-01-15 DIAGNOSIS — M25511 Pain in right shoulder: Secondary | ICD-10-CM | POA: Insufficient documentation

## 2017-01-15 MED ORDER — CIPROFLOXACIN HCL 500 MG PO TABS: 500.0000 mg | ORAL_TABLET | Freq: Two times a day (BID) | ORAL | 0 refills | Status: AC

## 2017-01-15 NOTE — Progress Notes (Addendum)
CSW received a call from [facility] stating pt can return and that the [facility] is on the premises setting oxygen up as of 4:55pm.  CSW updated RN and EDP.  CSW called pt's daughter to let them know transport has been called by [facility] ED.  Please reconsult if future social work needs arise.  CSW signing off, as social work intervention is no longer needed.  Alphonse Guild. Cheryle Dark, LCSWA, Deon Pilling, CSI Clinical Social Worker Ph: [phone]

## 2017-01-15 NOTE — ED Provider Notes (Signed)
[facility] DEPT Provider Note   CSN: [id number] Arrival date & time: 01/15/17  1456     History   Chief Complaint Chief Complaint  Patient presents with  . home Oxygen to be set up    HPI Jean Murphy is a 83 y.o. female with history of CHF, AAA, diabetes, chronic thoracic and lumbar compression fractures, hypertension who presents following a hospital admission discharge because her assisted living facility would not take her back because they were not equipped to provide home oxygen and there was no order for home oxygen prior to discharge. Social work is involved in working on setting up home oxygen and patient will be transferred once oxygen is in place. However, patient also reports pain in her right knee and shoulder from a recent fall prior to last admission. She reports she did not go to the hospital for these injuries and they have continued to hurt. She denies any other complaints, except for her chronic back pain.  HPI  Past Medical History:  Diagnosis Date  . AAA (abdominal aortic aneurysm) ([place])   . CHF (congestive heart failure) ([place])   . Compound fracture    left leg  . Compression fracture    x 2   . Diabetes mellitus without complication ([place])   . Eczema   . HTN (hypertension)   . Hyperlipidemia   . Hypertension   . Irregular heart beat   . Pleural effusion   . Stroke [facility])     Patient Active Problem List   Diagnosis Date Noted  . UTI (urinary tract infection) 01/14/2017  . Hypoxia 01/13/2017  . Postoperative anemia due to acute blood loss 01/17/2015  . Hip fracture, right ([place]) 01/16/2015  . Essential hypertension 01/16/2015  . Depression 01/16/2015  . Hypokalemia 01/16/2015  . Anemia of chronic disease 01/16/2015  . Leukocytosis 01/16/2015  . Fall 05/30/2014  . Atrial fibrillation ([place]) 01/30/2014    Past Surgical History:  Procedure Laterality Date  . ABDOMINAL HYSTERECTOMY    . CHOLECYSTECTOMY    . CORNEAL TRANSPLANT    . FEMUR IM  NAIL Right 01/16/2015   Procedure: INTRAMEDULLARY (IM) RETROGRADE FEMORAL NAILING;  Surgeon: Dorna Leitz, MD;  Location: [facility] ORS;  Service: Orthopedics;  Laterality: Right;  . KNEE SURGERY     right knee     OB History    Gravida Para Term Preterm AB Living   0 0 0 0 0     SAB TAB Ectopic Multiple Live Births   0 0 0           Home Medications    Prior to Admission medications   Medication Sig Start Date End Date Taking? Authorizing Provider  acetaminophen (TYLENOL) 500 MG tablet Take 500 mg by mouth every 6 (six) hours as needed (For pain. Not to exceed 4 tabs in 24 hours.).     [provider]  acidophilus (RISAQUAD) CAPS capsule Take 1 capsule by mouth 2 (two) times daily. Patient taking differently: Take 1 capsule by mouth daily.  06/09/14   Nita Sells, MD  ADVAIR DISKUS 100-50 MCG/DOSE AEPB Inhale 1 puff into the lungs 2 (two) times daily. 12/22/16   [provider]  aspirin EC 81 MG tablet Take 81 mg by mouth at bedtime.     [provider]  ciprofloxacin (CIPRO) 500 MG tablet Take 1 tablet (500 mg total) by mouth 2 (two) times daily. 01/15/17 01/20/17  Robbie Lis, MD  digoxin (LANOXIN) 0.125 MG tablet  Take 0.125 mg by mouth daily.    [provider]  famotidine (PEPCID) 20 MG tablet Take 20 mg by mouth at bedtime.    [provider]  ferrous sulfate 325 (65 FE) MG tablet Take 1 tablet (325 mg total) by mouth daily with breakfast. 01/19/15   Robbie Lis, MD  fluticasone [facility]) 50 MCG/ACT nasal spray Place 1 spray into both nostrils daily.  04/07/14   [provider]  furosemide (LASIX) 20 MG tablet Take 40 mg by mouth daily.     [provider]  gabapentin (NEURONTIN) 100 MG capsule Take 100 mg by mouth at bedtime. 12/14/16   [provider]  hydrALAZINE (APRESOLINE) 50 MG tablet Take 50 mg by mouth 3 (three) times daily.     [provider]  ipratropium (ATROVENT) 0.06 % nasal spray  Place 2 sprays into both nostrils daily. 01/09/17   [provider]  ipratropium-albuterol (DUONEB) 0.5-2.5 (3) MG/3ML SOLN Inhale 3 mLs into the lungs 2 (two) times daily. 12/13/16   [provider]  levocetirizine (XYZAL) 5 MG tablet Take 5 mg by mouth at bedtime. 12/23/16   [provider]  loperamide (IMODIUM) 2 MG capsule Take 2 mg by mouth daily as needed. 02/17/15   [provider]  Melatonin 3 MG TABS Take 3 mg by mouth at bedtime.    [provider]  metoprolol tartrate (LOPRESSOR) 25 MG tablet Take 25 mg by mouth 2 (two) times daily.    [provider]  Multiple Vitamins-Minerals (PRESERVISION AREDS PO) Take 1 tablet by mouth 2 (two) times daily.    [provider]  nitrofurantoin, macrocrystal-monohydrate, (MACROBID) 100 MG capsule Take 100 mg by mouth every 12 (twelve) hours. x7 days    [provider]  ondansetron (ZOFRAN) 4 MG tablet Take 4 mg by mouth every 6 (six) hours as needed for nausea/vomiting. 12/15/16   [provider]  PARoxetine (PAXIL) 10 MG tablet Take 10 mg by mouth daily. 12/21/16   [provider]  potassium chloride SA (K-DUR,KLOR-CON) 20 MEQ tablet Take 20 mEq by mouth daily.    [provider]  protein supplement (RESOURCE BENEPROTEIN) POWD Take 1 scoop by mouth 2 (two) times daily.    [provider]  SPIRIVA RESPIMAT 2.5 MCG/ACT AERS Inhale 1 puff into the lungs daily. 01/05/17   [provider]  VENTOLIN HFA 108 (90 Base) MCG/ACT inhaler Inhale 2 puffs into the lungs every 6 (six) hours as needed for shortness of breath. 01/01/17   [provider]    Family History Family History  Problem Relation Age of Onset  . Heart attack Mother 48  . Heart disease Mother     Social History Social History  Substance Use Topics  . Smoking status: Never Smoker  . Smokeless tobacco: Never Used  . Alcohol use No     Allergies   Penicillins and  Shellfish allergy   Review of Systems Review of Systems  Constitutional: Negative for chills and fever.  HENT: Negative for facial swelling and sore throat.   Respiratory: Negative for shortness of breath.   Cardiovascular: Negative for chest pain.  Gastrointestinal: Negative for abdominal pain, nausea and vomiting.  Genitourinary: Negative for dysuria.  Musculoskeletal: Positive for arthralgias and back pain.  Skin: Negative for rash and wound.  Neurological: Negative for headaches.  Psychiatric/Behavioral: The patient is not nervous/anxious.      Physical Exam Updated Vital Signs BP 134/79 (BP Location: Right Arm)  Pulse 65   Temp 98.1 F (36.7 C) (Oral)   Resp 19   SpO2 94%   Physical Exam  Constitutional: She appears well-developed and well-nourished. No distress.  HENT:  Head: Normocephalic and atraumatic.  Mouth/Throat: Oropharynx is clear and moist. No oropharyngeal exudate.  Eyes: Pupils are equal, round, and reactive to light. Conjunctivae are normal. Right eye exhibits no discharge. Left eye exhibits no discharge. No scleral icterus.  Neck: Normal range of motion. Neck supple. No thyromegaly present.  Cardiovascular: Normal rate, regular rhythm, normal heart sounds and intact distal pulses.  Exam reveals no gallop and no friction rub.   No murmur heard. Pulmonary/Chest: Effort normal and breath sounds normal. No stridor. No respiratory distress. She has no wheezes. She has no rales.  Abdominal: Soft. Bowel sounds are normal. She exhibits no distension. There is no tenderness. There is no rebound and no guarding.  Musculoskeletal: She exhibits no edema.  Right knee tenderness and edema Right shoulder tenderness; mild edema  Lymphadenopathy:    She has no cervical adenopathy.  Neurological: She is alert. Coordination normal.  Skin: Skin is warm and dry. No rash noted. She is not diaphoretic. No pallor.  Psychiatric: She has a normal mood and affect.  Nursing  note and vitals reviewed.    ED Treatments / Results  Labs (all labs ordered are listed, but only abnormal results are displayed) Labs Reviewed - No data to display  EKG  EKG Interpretation None       Radiology Dg Shoulder Right  Result Date: 01/15/2017 CLINICAL DATA:  Limited history provided by the patient. There has been a recent fall and the patient has complained of shoulder pain. EXAM: RIGHT SHOULDER - 2+ VIEW COMPARISON:  Shoulder radiograph of January 16, 2015. FINDINGS: The bones are subjectively osteopenic. The glenohumeral joint space is well maintained. Small spurs arise from the articular margins of the humeral head. There is spurring of the subacromial region and the distal clavicle. The observed portions of the upper right ribcage exhibit no acute abnormalities. IMPRESSION: No acute fracture or dislocation of the right shoulder is observed. Electronically Signed   By: David  Martinique M.D.   On: 01/15/2017 16:31   Dg Knee Complete 4 Views Right  Result Date: 01/15/2017 CLINICAL DATA:  Pain from fall. EXAM: RIGHT KNEE - COMPLETE 4+ VIEW COMPARISON:  01/16/2015 . FINDINGS: Intramedullary rod in the right femur. Hardware intact. Diffuse osteopenia. Tricompartment severe degenerative change. Loose bodies noted. Small knee joint effusion cannot be excluded. Peripheral vascular calcification. IMPRESSION: 1. intramedullary rod noted right femur.  Hardware intact. 2. Diffuse osteopenia and severe tricompartment degenerative change. Loose bodies noted. No acute bony abnormality identified. 3. Small knee joint effusion cannot be excluded. Peripheral vascular disease. Electronically Signed   By: Marcello Moores  Register   On: 01/15/2017 16:31    Procedures Procedures (including critical care time)  Medications Ordered in ED Medications - No data to display   Initial Impression / Assessment and Plan / ED Course  I have reviewed the triage vital signs and the nursing notes.  Pertinent labs  & imaging results that were available during my care of the patient were reviewed by me and considered in my medical decision making (see chart for details).     X-rays of the right knee and right shoulder show no acute abnormality. Social worker arranged for patient to have home oxygen. Patient able to be transported back to her assisted-living facility at [place]. Follow-up to PCP this week.  Patient vitals stable throughout ED course and discharged in satisfactory condition. Patient also evaluated by Dr. Billy Fischer who agrees with plan.  Final Clinical Impressions(s) / ED Diagnoses   Final diagnoses:  Oxygen dependent  Acute pain of right knee    New Prescriptions New Prescriptions   No medications on file     Caryl Ada 01/15/17 Pia Mau, MD 01/21/17 [phone]

## 2017-01-15 NOTE — Discharge Instructions (Signed)
Use ice on your head 3-4 times daily alternating and 15 minutes on, 15 minutes off. He can take Tylenol as prescribed over-the-counter, as needed for your pain. Use oxygen if you are feeling short of breath or if your oxygen levels are dropping below 93%. Please follow-up with your primary care provider next week for recheck symptoms and further evaluation and treatment of intermittent low oxygen levels.

## 2017-01-15 NOTE — Progress Notes (Signed)
SATURATION QUALIFICATIONS: (This note is used to comply with regulatory documentation for home oxygen)  Patient Saturations on Room Air at Rest = 85%  Patient Saturations on Room Air while Ambulating = Patient None mobile 85 %  Patient Saturations on 2 Liters of oxygen while Ambulating = 91%  Please briefly explain why patient needs home oxygen: Lasix and breathing treatment was given and patient still requiring oxygen at rest. Patient unable to walk, wheelchair bound.

## 2017-01-15 NOTE — ED Notes (Signed)
Charge RN confirmed that Jean Murphy is aware of patient needing transport.

## 2017-01-15 NOTE — ED Notes (Signed)
Per Jacqlyn Krauss, [facility] is currently setting up oxygen at SNF as we speak and facility reports that don't need any report.

## 2017-01-15 NOTE — ED Triage Notes (Signed)
Per [facility] was discharged form the 4th floor today and was to return to [facility] assisted living-facility refused patient r/t not equipped to provide home O2-no order for O2 prior to discharge-social work is involved and will set up home O2-patient to be transferred onceO2 is in place

## 2017-01-15 NOTE — ED Notes (Signed)
Patient transported to X-ray 

## 2017-01-15 NOTE — ED Notes (Signed)
ED Provider at bedside. 

## 2017-01-15 NOTE — Progress Notes (Signed)
MD was called for home O2 written orders. [facility] was called again to see if O2 can be delivered to [facility].

## 2017-01-15 NOTE — Progress Notes (Signed)
CSWs following as pt was DC'd earlier today to return to [facility], however pt arrived prior to home O2 being set-up/delivered to facility. Received confirmation from CM and [facility] that O2 now ordered and scheduled to arrive this evening by 5pm. [facility] to notify pm CSW when O2 arrived, and at that time transport back to [facility] can be arranged by CSW.  CSW spoke with pt's daughter to notify her of situation. Daughter understanding and requests she be updated when pt returns to [facility]- pt's daughter [name], contact # in emergency contacts in epic chart.  Updated Engineer, agricultural and AD of situation as well.  [facility] contact/report # [phone]  Sharren Bridge, MSW, [place] [facility] 01/15/2017 [phone]

## 2017-01-15 NOTE — Progress Notes (Signed)
Patient discharged back to [facility] via ambulance, discharge packet prepared by CSW and given to EMS for facility, report called to the nurse at facility. Patient in no pain/distress, bruises on forehead and arms, no ulcer noted.

## 2017-01-15 NOTE — ED Notes (Signed)
Notified [facility] about transport needed back to [facility]

## 2017-01-15 NOTE — Progress Notes (Signed)
A call from [facility], SW O2 is in the pt's room at [facility].

## 2017-01-15 NOTE — Progress Notes (Signed)
Patient was discharged earlier today with plans to return to [facility]. Patient was unable to return due to no oxygen being set up at [facility]. CSW spoke with [facility] who originally stated O2 would not be delievered until 4PM-8PM. CSW received return call stating O2 would be delivered between 4PM-5PM. Patient is only able to discharge back to [facility] once oxygen is delivered to facility.   CSW requested [facility] and [facility] to contact CSW once oxygen is delivered. Once confirmation that O2 is delivered patient can be transported via [facility].   If any questions please contact CSW at [facility], [facility] Emergency Room Clinical Social Worker [phone]

## 2017-01-15 NOTE — Discharge Instructions (Signed)
Please follow-up with your primary care provider this week for recheck of symptoms. Please return to emergency department if you develop any new or worsening symptoms.

## 2017-01-15 NOTE — ED Notes (Signed)
Patient refusing to keep oxygen on. Having to constantly remind patient while putting O2 nasal canula back on patient that her saturation is low and needs to wear the oxygen.

## 2017-01-15 NOTE — Progress Notes (Signed)
O2 will be delivered to [facility] at 1630 today/[facility].

## 2017-01-15 NOTE — Discharge Summary (Signed)
Physician Discharge Summary  Jean Murphy:[id number] DOB: 07-27-1917 DOA: 01/13/2017  PCP: Antony Blackbird, MD  Admit date: 01/13/2017 Discharge date: 01/15/2017  Recommendations for Outpatient Follow-up:  Continue Cipro for 5 days on discharge Please recheck sodium level in 2-3 days to make sure it remains stable. Current sodium 147, likely reflective of poor fluid intake Please have palliative care services continue to address goals of care  Discharge Diagnoses:  Principal Problem:   Hypoxia Active Problems:   Fall   Atrial fibrillation ([place])   Essential hypertension   Depression   Anemia of chronic disease   UTI (urinary tract infection)    Discharge Condition: stable   Diet recommendation: as tolerated   History of present illness:  83 year old female, lives in assisted living facility, medical history significant for atrial fibrillation, not on anticoagulation, hypertension, diastolic CHF, depression. Patient was sitting on her wheelchair and fell out off her wheelchair. There was no loss of consciousness. There was a contusion to the right side of the head and patient was brought to ED for further evaluation. In ED, patient was distracted, confused but not known what her baseline mental status is. Family is not at the bedside to provide details of medical history. Patient was hypoxic with saturations in the low 80s. She was placed on oxygen support via nasal cannula. Further, patient was found to have urinary tract infection and was started on Cipro.  Hospital Course:   Principal Problem: Acute respiratory failure with hypoxia - Chest x-ray showed left base atelectasis with small left pleural effusion but no edema or consolidation - CT angiogram chest showed no pulmonary embolism - Continue oxygen support via nasal cannula to keep oxygen saturation above 90% - Stable respiratory status  Active Problems: Fall - No acute findings and CT head and CT cervical spine -  Thoracic spine x-ray showed stable compression fractures at T6, T11 and T12, no new fractures - Lumbar spine x-ray showed advanced lower thoracic and lumbar degenerative spondylosis at all levels as well as chronic compression fractures - Will go to skilled nursing facility  UTI - Continue Cipro for 5 days on discharge  Paroxysmal atrial fibrillation (HCC) - CHADS vasc score at least 4 - Continue daily aspirin - Rate is controlled with digoxin and metoprolol  Essential hypertension - Continue hydralazine and metoprolol  Depression - Continue Paxil   Anemia of chronic disease / Iron deficiency anemia  - Continue iron supplementation   Chronic diastolic CHF - Compensated - BNP 384 on this admission  - ECHO 02/2014 showed preserved EF  - Continue lasix on discharge    DVT prophylaxis: Hep subQ Code Status: DNR/DNI  Family Communication: no family at the bedside    Consultants:   PT  Procedures:   None   Antimicrobials:   Cipro 01/14/2017 --> for 5 days on discharge   Signed:  Leisa Lenz, MD  [facility] 01/15/2017, 8:07 AM  Pager #: [phone]  Time spent in minutes: more than 30 minutes    Discharge Exam: Vitals:   01/14/17 2221 01/15/17 0628  BP: 139/77 (!) 159/74  Pulse: 70 81  Resp:  18  Temp: 98.1 F (36.7 C) 97.6 F (36.4 C)  SpO2: 92% 97%   Vitals:   01/14/17 1306 01/14/17 2004 01/14/17 2221 01/15/17 0628  BP: 134/65  139/77 (!) 159/74  Pulse: 73  70 81  Resp: 16   18  Temp: 98 F (36.7 C)  98.1 F (36.7 C) 97.6 F (36.4  C)  TempSrc: Oral  Oral Oral  SpO2: 92% 95% 92% 97%  Weight:      Height:        General: Pt is not in acute distress  Cardiovascular: rate controlled, appreciate S1, S2 Respiratory: no wheezing, no crackles, no rhonchi Abdominal: Soft, non tender, non distended, bowel sounds +, no guarding Extremities: no edema, no cyanosis, pulses palpable  Neuro: Grossly nonfocal  Discharge  Instructions  Discharge Instructions    Call MD for:  difficulty breathing, headache or visual disturbances    Complete by:  As directed    Call MD for:  redness, tenderness, or signs of infection (pain, swelling, redness, odor or green/yellow discharge around incision site)    Complete by:  As directed    Call MD for:  severe uncontrolled pain    Complete by:  As directed    Diet - low sodium heart healthy    Complete by:  As directed    Discharge instructions    Complete by:  As directed    Continue Cipro for 5 days on discharge   Increase activity slowly    Complete by:  As directed      Allergies as of 01/15/2017      Reactions   Penicillins Other (See Comments)   Reaction unknown per [facility]   Shellfish Allergy Hives      Medication List    STOP taking these medications   feeding supplement Liqd   traMADol 50 MG tablet Commonly known as:  ULTRAM     TAKE these medications   acetaminophen 500 MG tablet Commonly known as:  TYLENOL Take 500 mg by mouth every 6 (six) hours as needed (For pain. Not to exceed 4 tabs in 24 hours.).   acidophilus Caps capsule Take 1 capsule by mouth 2 (two) times daily. What changed:  when to take this   ADVAIR DISKUS 100-50 MCG/DOSE Aepb Generic drug:  Fluticasone-Salmeterol Inhale 1 puff into the lungs 2 (two) times daily.   aspirin EC 81 MG tablet Take 81 mg by mouth at bedtime. What changed:  Another medication with the same name was removed. Continue taking this medication, and follow the directions you see here.   ciprofloxacin 500 MG tablet Commonly known as:  CIPRO Take 1 tablet (500 mg total) by mouth 2 (two) times daily.   digoxin 0.125 MG tablet Commonly known as:  LANOXIN Take 0.125 mg by mouth daily.   famotidine 20 MG tablet Commonly known as:  PEPCID Take 20 mg by mouth at bedtime.   ferrous sulfate 325 (65 FE) MG tablet Take 1 tablet (325 mg total) by mouth daily with breakfast.   fluticasone 50 MCG/ACT nasal  spray Commonly known as:  FLONASE Place 1 spray into both nostrils daily.   furosemide 20 MG tablet Commonly known as:  LASIX Take 40 mg by mouth daily.   gabapentin 100 MG capsule Commonly known as:  NEURONTIN Take 100 mg by mouth at bedtime.   hydrALAZINE 50 MG tablet Commonly known as:  APRESOLINE Take 50 mg by mouth 3 (three) times daily.   ipratropium 0.06 % nasal spray Commonly known as:  ATROVENT Place 2 sprays into both nostrils daily.   ipratropium-albuterol 0.5-2.5 (3) MG/3ML Soln Commonly known as:  DUONEB Inhale 3 mLs into the lungs 2 (two) times daily.   levocetirizine 5 MG tablet Commonly known as:  XYZAL Take 5 mg by mouth at bedtime.   loperamide 2 MG capsule Commonly known  as:  IMODIUM Take 2 mg by mouth daily as needed.   Melatonin 3 MG Tabs Take 3 mg by mouth at bedtime.   metoprolol tartrate 25 MG tablet Commonly known as:  LOPRESSOR Take 25 mg by mouth 2 (two) times daily.   nitrofurantoin (macrocrystal-monohydrate) 100 MG capsule Commonly known as:  MACROBID Take 100 mg by mouth every 12 (twelve) hours. x7 days   ondansetron 4 MG tablet Commonly known as:  ZOFRAN Take 4 mg by mouth every 6 (six) hours as needed for nausea/vomiting.   PARoxetine 10 MG tablet Commonly known as:  PAXIL Take 10 mg by mouth daily.   potassium chloride SA 20 MEQ tablet Commonly known as:  K-DUR,KLOR-CON Take 20 mEq by mouth daily.   PRESERVISION AREDS PO Take 1 tablet by mouth 2 (two) times daily.   protein supplement Powd Take 1 scoop by mouth 2 (two) times daily.   SPIRIVA RESPIMAT 2.5 MCG/ACT Aers Generic drug:  Tiotropium Bromide Monohydrate Inhale 1 puff into the lungs daily.   VENTOLIN HFA 108 (90 Base) MCG/ACT inhaler Generic drug:  albuterol Inhale 2 puffs into the lungs every 6 (six) hours as needed for shortness of breath.      Follow-up Information    Schedule an appointment as soon as possible for a visit with Antony Blackbird, MD.    Specialty:  Family Medicine Why:  For follow up of today's visit Contact information: [address] [address] [phone]        Antony Blackbird, MD. Schedule an appointment as soon as possible for a visit.   Specialty:  Family Medicine Contact information: [address] [place] [address] [phone]            The results of significant diagnostics from this hospitalization (including imaging, microbiology, ancillary and laboratory) are listed below for reference.    Significant Diagnostic Studies: Dg Chest 2 View  Result Date: 01/13/2017 CLINICAL DATA:  Pain following fall.  Decreased oxygen saturation EXAM: CHEST  2 VIEW COMPARISON:  May 30, 2015 FINDINGS: There is atelectatic change in the left base with small left pleural effusion. Lungs elsewhere clear. There is cardiomegaly with pulmonary vascularity within normal limits. There is aortic atherosclerosis. No adenopathy. No pneumothorax. No bone lesions. IMPRESSION: Left base atelectasis with small left pleural effusion. No edema or consolidation. There is stable cardiac enlargement. There is aortic atherosclerosis. Aortic Atherosclerosis (ICD10-I70.0). Electronically Signed   By: Lowella Grip III M.D.   On: 01/13/2017 15:30   Dg Thoracic Spine 2 View  Result Date: 01/13/2017 CLINICAL DATA:  Fall.  History of multiple compression fractures EXAM: THORACIC SPINE 3 VIEWS COMPARISON:  Chest radiograph May 30, 2015 FINDINGS: Frontal, lateral, and swimmer's views obtained. There is marked compression of the T6 vertebral body with mild retropulsion of bone posteriorly, unchanged from prior study. There is moderately severe anterior wedging at T11 with milder anterior wedging at T12, also stable from prior study. No new fracture evident. No spondylolisthesis. There is osteoarthritic change at multiple levels. Disc space narrowing is most marked at T5-6. There is no erosive change. No paraspinous  lesions. There is extensive aortic atherosclerosis. IMPRESSION: Stable compression fractures at T6, T11, and T12, most severe at T6 with mild retropulsion of bone at this level. No new fracture. No spondylolisthesis. There are areas of osteoarthritic change, most notable at T5-6. There is aortic atherosclerosis. Aortic Atherosclerosis (ICD10-I70.0). Electronically Signed   By: Lowella Grip III M.D.   On:  01/13/2017 14:13   Dg Lumbar Spine Complete  Result Date: 01/13/2017 CLINICAL DATA:  Recent fall from wheelchair at a nursing home, back pain, chronic thoracic and lumbar compression fractures EXAM: LUMBAR SPINE - COMPLETE 4+ VIEW COMPARISON:  05/30/2015 CT reconstructions FINDINGS: Advanced lower thoracic and lumbar degenerative spondylosis at all levels with marked disc space narrowing, sclerosis and endplate osteophytes. Vacuum disc phenomena at L4-5 on the lateral view. Similar chronic compression fractures at T11, T12, and L2. No definite new compression fracture by plain radiography. Lower lumbar facet arthropathy at L4-5 and L5-S1. Extensive atherosclerosis of the aorta. Bones are osteopenic. IMPRESSION: Advanced lower thoracic and lumbar degenerative spondylosis at all levels and lower lumbar facet arthropathy. Chronic compression fractures at T11, T12, and L1 appearing grossly similar. No significant interval change or new finding by plain radiography Aortoiliac atherosclerosis Electronically Signed   By: Jerilynn Mages.  Shick M.D.   On: 01/13/2017 14:15   Ct Head Wo Contrast  Result Date: 01/13/2017 CLINICAL DATA:  Pt was sitting in wheelchair, states she reached for something and fell out of wheelchair. Pt has contusion to right side of head. Pt is not on blood thinners. Pt denies loss of consciousness. Pt denies pain. EXAM: CT HEAD WITHOUT CONTRAST CT CERVICAL SPINE WITHOUT CONTRAST TECHNIQUE: Multidetector CT imaging of the head and cervical spine was performed following the standard protocol without  intravenous contrast. Multiplanar CT image reconstructions of the cervical spine were also generated. FINDINGS: CT HEAD FINDINGS Brain: No evidence of acute infarction, hemorrhage, hydrocephalus, extra-axial collection or mass lesion/mass effect. There is age uppercase ventricular and sulcal enlargement. Mild periventricular white matter hypoattenuation is also noted consistent with chronic microvascular ischemic change. Vascular: No hyperdense vessel or unexpected calcification. Skull: Normal. Negative for fracture or focal lesion. Sinuses/Orbits: Globes and orbits are unremarkable. Sinuses and mastoid air cells are clear. Other: Small right frontal scalp hematoma. CT CERVICAL SPINE FINDINGS Alignment: Normal. Skull base and vertebrae: No acute fracture. No primary bone lesion or focal pathologic process. Soft tissues and spinal canal: No prevertebral fluid or swelling. No visible canal hematoma. No soft tissue masses. There are subcentimeter thyroid nodules. Carotid artery vascular calcifications are noted. Disc levels: Moderate loss of disc height with endplate spurring at W1-U2 through C6-C7. Minor loss of disc height with endplate spurring at V2-Z3. Facet degenerative changes are noted bilaterally, greatest on the right at C2-C3 and on the left at C3-C4. No convincing disc herniation. Upper chest: Lung apices are clear. Other: None. IMPRESSION: HEAD CT: No acute intracranial abnormalities. Small right lateral frontal scalp hematoma. No fracture. CERVICAL CT:  No fracture or acute finding. Electronically Signed   By: Lajean Manes M.D.   On: 01/13/2017 13:55   Ct Angio Chest Pe W And/or Wo Contrast  Result Date: 01/13/2017 CLINICAL DATA:  Hypoxia after fall. EXAM: CT ANGIOGRAPHY CHEST WITH CONTRAST TECHNIQUE: Multidetector CT imaging of the chest was performed using the standard protocol during bolus administration of intravenous contrast. Multiplanar CT image reconstructions and MIPs were obtained to  evaluate the vascular anatomy. CONTRAST:  100 mL of Isovue 370 COMPARISON:  None. FINDINGS: Cardiovascular: Atherosclerotic changes seen in the non aneurysmal thoracic aorta. Evaluation for dissection is markedly limited due to lack of enhancement with contrast. Cardiomegaly is identified. Coronary artery calcifications are noted. Linear low attenuation is seen in the left main pulmonary artery, extending into the upper and lower lobe branches proximally. This has an appearance of mixing rather than embolus. Evaluation of the lower lobe pulmonary  arterial branches is limited due to respiratory motion. Within this limitation, no pulmonary emboli are identified. Mediastinum/Nodes: No enlarged mediastinal, hilar, or axillary lymph nodes. Thyroid gland, trachea, and esophagus demonstrate no significant findings. Lungs/Pleura: Central airways are normal. There is atelectasis in the left base. No suspicious infiltrates. A few calcified nodules are consistent previous granulomatous disease. Scattered atelectasis is seen. No suspicious nodules, masses, or evidence of pneumonia. Upper Abdomen: No acute abnormality. Musculoskeletal: Compression fractures of multiple levels appear stable since January 2017. Review of the MIP images confirms the above findings. IMPRESSION: 1. No pulmonary emboli identified as above.  Cardiomegaly. 2. Atherosclerosis in the thoracic aorta. Coronary artery calcifications. Aortic Atherosclerosis (ICD10-I70.0). Electronically Signed   By: Dorise Bullion III M.D   On: 01/13/2017 17:56   Ct Cervical Spine Wo Contrast  Result Date: 01/13/2017 CLINICAL DATA:  Pt was sitting in wheelchair, states she reached for something and fell out of wheelchair. Pt has contusion to right side of head. Pt is not on blood thinners. Pt denies loss of consciousness. Pt denies pain. EXAM: CT HEAD WITHOUT CONTRAST CT CERVICAL SPINE WITHOUT CONTRAST TECHNIQUE: Multidetector CT imaging of the head and cervical spine  was performed following the standard protocol without intravenous contrast. Multiplanar CT image reconstructions of the cervical spine were also generated. FINDINGS: CT HEAD FINDINGS Brain: No evidence of acute infarction, hemorrhage, hydrocephalus, extra-axial collection or mass lesion/mass effect. There is age uppercase ventricular and sulcal enlargement. Mild periventricular white matter hypoattenuation is also noted consistent with chronic microvascular ischemic change. Vascular: No hyperdense vessel or unexpected calcification. Skull: Normal. Negative for fracture or focal lesion. Sinuses/Orbits: Globes and orbits are unremarkable. Sinuses and mastoid air cells are clear. Other: Small right frontal scalp hematoma. CT CERVICAL SPINE FINDINGS Alignment: Normal. Skull base and vertebrae: No acute fracture. No primary bone lesion or focal pathologic process. Soft tissues and spinal canal: No prevertebral fluid or swelling. No visible canal hematoma. No soft tissue masses. There are subcentimeter thyroid nodules. Carotid artery vascular calcifications are noted. Disc levels: Moderate loss of disc height with endplate spurring at A5-W0 through C6-C7. Minor loss of disc height with endplate spurring at J8-J1. Facet degenerative changes are noted bilaterally, greatest on the right at C2-C3 and on the left at C3-C4. No convincing disc herniation. Upper chest: Lung apices are clear. Other: None. IMPRESSION: HEAD CT: No acute intracranial abnormalities. Small right lateral frontal scalp hematoma. No fracture. CERVICAL CT:  No fracture or acute finding. Electronically Signed   By: Lajean Manes M.D.   On: 01/13/2017 13:55    Microbiology: Recent Results (from the past 240 hour(s))  MRSA PCR Screening     Status: None   Collection Time: 01/14/17  8:51 AM  Result Value Ref Range Status   MRSA by PCR NEGATIVE NEGATIVE Final    Comment:        The GeneXpert MRSA Assay (FDA approved for NASAL specimens only), is one  component of a comprehensive MRSA colonization surveillance program. It is not intended to diagnose MRSA infection nor to guide or monitor treatment for MRSA infections.      Labs: Basic Metabolic Panel:  Recent Labs Lab 01/13/17 1544 01/14/17 0429  NA 143 148*  K 4.6 3.5  CL 108 108  CO2 26 30  GLUCOSE 123* 140*  BUN 16 18  CREATININE 0.72 0.81  CALCIUM 8.1* 8.9   Liver Function Tests: No results for input(s): AST, ALT, ALKPHOS, BILITOT, PROT, ALBUMIN in the last 168 hours.  No results for input(s): LIPASE, AMYLASE in the last 168 hours. No results for input(s): AMMONIA in the last 168 hours. CBC:  Recent Labs Lab 01/13/17 1544 01/14/17 0429  WBC 16.3* 12.4*  NEUTROABS 14.5*  --   HGB 12.5 12.3  HCT 38.6 38.0  MCV 93.2 93.1  PLT 187 173   Cardiac Enzymes: No results for input(s): CKTOTAL, CKMB, CKMBINDEX, TROPONINI in the last 168 hours. BNP: BNP (last 3 results)  Recent Labs  01/13/17 1545  BNP 384.0*    ProBNP (last 3 results) No results for input(s): PROBNP in the last 8760 hours.  CBG: No results for input(s): GLUCAP in the last 168 hours.

## 2017-01-15 NOTE — Clinical Social Work Note (Signed)
Clinical Social Work Assessment  Patient Details  Name: Jean Murphy MRN: [id number] Date of Birth: Aug 12, 1917  Date of referral:  01/15/17               Reason for consult:  Discharge Planning                Permission sought to share information with:  Family Supports, Customer service manager Permission granted to share information::  Yes, Verbal Permission Granted  Name::     daughter [name]  Agency::  [facility]  Relationship::     Contact Information:     Housing/Transportation Living arrangements for the past 2 months:  [place] of Information:  Patient, Adult Children, Facility Patient Interpreter Needed:  None Criminal Activity/Legal Involvement Pertinent to Current Situation/Hospitalization:  No - Comment as needed Significant Relationships:  Adult Children, Warehouse manager Lives with:  Facility Resident Do you feel safe going back to the place where you live?  Yes Need for family participation in patient care:  No (Coment)  Care giving concerns:  Pt is from [place]. Per daughter and facility- pt uses wheelchair at baseline and needs assistance with transferring and with ADLs. No caregiving concerns reported.    Social Worker assessment / plan:  CSW consulted to assist with DC planning. Pt is resident of ALF. Uses wheelchair for ambulation.  Reviewed PT evaluation with daughter, pt, and with facility (facility rep via phone). They report this level of functioning is baseline for pt and her needs can continue to be met at ALF rather than pursue SNF for rehab. Facility states no FL2 needed as pt was not admitted for 3 days.  CSW provided information for facility via the HUB and scripts included in transfer packet. Pt will transport via [facility] completed medical necessity form and arranged for transportation. Report # for RN [phone]- pt in room #38-B.  Plan: Return to [facility].  Employment status:  Retired Nurse, adult PT Recommendations:  [facility] (or ALF) Information / Referral to community resources:     Patient/Family's Response to care:  Pt and daughter appreciative of care  Patient/Family's Understanding of and Emotional Response to Diagnosis, Current Treatment, and Prognosis:  Pt required CSW to repeat questions and explanations but did show understanding and happiness that she is ready for DC and will be returning to her ALF. States, "Oh that will be wonderful, and my daughter is happy." Daughter demonstrated adequate understanding of plan and asked pertinent questions, states she will also be in touch with ALF and will meet pt there later today.  Emotional Assessment Appearance:  Appears stated age Attitude/Demeanor/Rapport:   (pleasant) Affect (typically observed):  Accepting, Happy Orientation:  Oriented to Place, Oriented to Self, Oriented to Situation Alcohol / Substance use:  Not Applicable Psych involvement (Current and /or in the community):  No (Comment)  Discharge Needs  Concerns to be addressed:  Discharge Planning Concerns Readmission within the last 30 days:  No Current discharge risk:  None Barriers to Discharge:  No Barriers Identified   Nila Nephew, LCSW 01/15/2017, 10:08 AM  [phone]

## 2017-01-15 NOTE — Progress Notes (Signed)
No home O2 orders, for pt to discharge on. [facility] waiting for orders.

## 2017-04-28 DEATH — deceased
